# Patient Record
Sex: Female | Born: 1980
Health system: Southern US, Community
[De-identification: ages and names within clinical notes are randomized; demographics above are authoritative.]

## PROBLEM LIST (undated history)

## (undated) DIAGNOSIS — G43909 Migraine, unspecified, not intractable, without status migrainosus: Secondary | ICD-10-CM

## (undated) DIAGNOSIS — IMO0001 Reserved for inherently not codable concepts without codable children: Secondary | ICD-10-CM

## (undated) DIAGNOSIS — D649 Anemia, unspecified: Secondary | ICD-10-CM

## (undated) DIAGNOSIS — E039 Hypothyroidism, unspecified: Secondary | ICD-10-CM

## (undated) DIAGNOSIS — M1990 Unspecified osteoarthritis, unspecified site: Secondary | ICD-10-CM

## (undated) DIAGNOSIS — R51 Headache: Secondary | ICD-10-CM

## (undated) DIAGNOSIS — R519 Headache, unspecified: Secondary | ICD-10-CM

## (undated) HISTORY — DX: Hypothyroidism, unspecified: E03.9

## (undated) HISTORY — DX: Anemia, unspecified: D64.9

## (undated) HISTORY — PX: TUBAL LIGATION: SHX77

---

## 2004-10-14 ENCOUNTER — Ambulatory Visit (HOSPITAL_COMMUNITY): Admission: RE | Admit: 2004-10-14 | Discharge: 2004-10-14 | Payer: Self-pay | Admitting: Obstetrics

## 2005-01-21 ENCOUNTER — Ambulatory Visit (HOSPITAL_COMMUNITY): Admission: RE | Admit: 2005-01-21 | Discharge: 2005-01-21 | Payer: Self-pay | Admitting: Obstetrics

## 2005-01-26 ENCOUNTER — Inpatient Hospital Stay (HOSPITAL_COMMUNITY): Admission: AD | Admit: 2005-01-26 | Discharge: 2005-01-26 | Payer: Self-pay | Admitting: Obstetrics

## 2005-04-10 ENCOUNTER — Inpatient Hospital Stay (HOSPITAL_COMMUNITY): Admission: AD | Admit: 2005-04-10 | Discharge: 2005-04-10 | Payer: Self-pay | Admitting: Obstetrics

## 2005-04-15 ENCOUNTER — Inpatient Hospital Stay (HOSPITAL_COMMUNITY): Admission: AD | Admit: 2005-04-15 | Discharge: 2005-04-15 | Payer: Self-pay | Admitting: Obstetrics

## 2005-04-16 ENCOUNTER — Ambulatory Visit (HOSPITAL_COMMUNITY): Admission: RE | Admit: 2005-04-16 | Discharge: 2005-04-16 | Payer: Self-pay | Admitting: Obstetrics & Gynecology

## 2005-05-16 ENCOUNTER — Inpatient Hospital Stay (HOSPITAL_COMMUNITY): Admission: AD | Admit: 2005-05-16 | Discharge: 2005-05-16 | Payer: Self-pay | Admitting: Obstetrics

## 2005-07-01 ENCOUNTER — Ambulatory Visit (HOSPITAL_COMMUNITY): Admission: RE | Admit: 2005-07-01 | Discharge: 2005-07-01 | Payer: Self-pay | Admitting: Obstetrics & Gynecology

## 2005-07-27 ENCOUNTER — Inpatient Hospital Stay (HOSPITAL_COMMUNITY): Admission: AD | Admit: 2005-07-27 | Discharge: 2005-08-05 | Payer: Self-pay | Admitting: Obstetrics & Gynecology

## 2005-08-06 ENCOUNTER — Inpatient Hospital Stay (HOSPITAL_COMMUNITY): Admission: AD | Admit: 2005-08-06 | Discharge: 2005-08-06 | Payer: Self-pay | Admitting: Obstetrics

## 2005-08-09 ENCOUNTER — Inpatient Hospital Stay (HOSPITAL_COMMUNITY): Admission: AD | Admit: 2005-08-09 | Discharge: 2005-08-09 | Payer: Self-pay | Admitting: Obstetrics & Gynecology

## 2005-08-17 ENCOUNTER — Inpatient Hospital Stay (HOSPITAL_COMMUNITY): Admission: AD | Admit: 2005-08-17 | Discharge: 2005-08-17 | Payer: Self-pay | Admitting: Obstetrics & Gynecology

## 2005-08-19 ENCOUNTER — Inpatient Hospital Stay (HOSPITAL_COMMUNITY): Admission: AD | Admit: 2005-08-19 | Discharge: 2005-08-25 | Payer: Self-pay | Admitting: Obstetrics & Gynecology

## 2005-08-27 ENCOUNTER — Inpatient Hospital Stay (HOSPITAL_COMMUNITY): Admission: AD | Admit: 2005-08-27 | Discharge: 2005-09-06 | Payer: Self-pay | Admitting: Obstetrics

## 2005-09-03 ENCOUNTER — Encounter (INDEPENDENT_AMBULATORY_CARE_PROVIDER_SITE_OTHER): Payer: Self-pay | Admitting: Specialist

## 2006-02-18 ENCOUNTER — Emergency Department (HOSPITAL_COMMUNITY): Admission: EM | Admit: 2006-02-18 | Discharge: 2006-02-18 | Payer: Self-pay | Admitting: Emergency Medicine

## 2006-03-30 ENCOUNTER — Emergency Department (HOSPITAL_COMMUNITY): Admission: EM | Admit: 2006-03-30 | Discharge: 2006-03-30 | Payer: Self-pay | Admitting: Family Medicine

## 2006-08-04 ENCOUNTER — Inpatient Hospital Stay (HOSPITAL_COMMUNITY): Admission: AD | Admit: 2006-08-04 | Discharge: 2006-08-04 | Payer: Self-pay | Admitting: Obstetrics and Gynecology

## 2006-08-18 ENCOUNTER — Ambulatory Visit (HOSPITAL_COMMUNITY): Admission: RE | Admit: 2006-08-18 | Discharge: 2006-08-18 | Payer: Self-pay | Admitting: Obstetrics and Gynecology

## 2006-10-18 HISTORY — PX: BILATERAL SALPINGECTOMY: SHX5743

## 2006-10-31 ENCOUNTER — Ambulatory Visit: Payer: Self-pay | Admitting: Obstetrics & Gynecology

## 2006-11-01 ENCOUNTER — Ambulatory Visit (HOSPITAL_COMMUNITY): Admission: RE | Admit: 2006-11-01 | Discharge: 2006-11-01 | Payer: Self-pay | Admitting: Family Medicine

## 2006-11-02 ENCOUNTER — Ambulatory Visit: Payer: Self-pay | Admitting: Obstetrics & Gynecology

## 2006-11-02 ENCOUNTER — Inpatient Hospital Stay (HOSPITAL_COMMUNITY): Admission: AD | Admit: 2006-11-02 | Discharge: 2006-11-03 | Payer: Self-pay | Admitting: Obstetrics and Gynecology

## 2006-11-07 ENCOUNTER — Ambulatory Visit: Payer: Self-pay | Admitting: Obstetrics & Gynecology

## 2006-11-14 ENCOUNTER — Ambulatory Visit: Payer: Self-pay | Admitting: Obstetrics & Gynecology

## 2006-11-16 ENCOUNTER — Ambulatory Visit: Payer: Self-pay | Admitting: *Deleted

## 2006-11-16 ENCOUNTER — Inpatient Hospital Stay (HOSPITAL_COMMUNITY): Admission: AD | Admit: 2006-11-16 | Discharge: 2006-11-16 | Payer: Self-pay | Admitting: Gynecology

## 2006-11-21 ENCOUNTER — Ambulatory Visit: Payer: Self-pay | Admitting: Obstetrics & Gynecology

## 2006-11-22 ENCOUNTER — Ambulatory Visit: Payer: Self-pay | Admitting: Obstetrics & Gynecology

## 2006-11-28 ENCOUNTER — Ambulatory Visit: Payer: Self-pay | Admitting: Family Medicine

## 2006-11-29 ENCOUNTER — Ambulatory Visit: Payer: Self-pay | Admitting: *Deleted

## 2006-11-29 ENCOUNTER — Inpatient Hospital Stay (HOSPITAL_COMMUNITY): Admission: AD | Admit: 2006-11-29 | Discharge: 2006-11-29 | Payer: Self-pay | Admitting: Gynecology

## 2006-12-05 ENCOUNTER — Ambulatory Visit: Payer: Self-pay | Admitting: Obstetrics & Gynecology

## 2006-12-08 ENCOUNTER — Ambulatory Visit: Payer: Self-pay | Admitting: Family Medicine

## 2006-12-12 ENCOUNTER — Ambulatory Visit: Payer: Self-pay | Admitting: Family Medicine

## 2006-12-15 ENCOUNTER — Ambulatory Visit: Payer: Self-pay | Admitting: *Deleted

## 2006-12-15 ENCOUNTER — Inpatient Hospital Stay (HOSPITAL_COMMUNITY): Admission: AD | Admit: 2006-12-15 | Discharge: 2006-12-16 | Payer: Self-pay | Admitting: Family Medicine

## 2006-12-15 ENCOUNTER — Ambulatory Visit: Payer: Self-pay | Admitting: Family Medicine

## 2006-12-19 ENCOUNTER — Ambulatory Visit: Payer: Self-pay | Admitting: Family Medicine

## 2006-12-22 ENCOUNTER — Ambulatory Visit: Payer: Self-pay | Admitting: Obstetrics & Gynecology

## 2006-12-24 ENCOUNTER — Inpatient Hospital Stay (HOSPITAL_COMMUNITY): Admission: AD | Admit: 2006-12-24 | Discharge: 2006-12-24 | Payer: Self-pay | Admitting: Obstetrics and Gynecology

## 2006-12-24 ENCOUNTER — Ambulatory Visit: Payer: Self-pay | Admitting: Obstetrics and Gynecology

## 2006-12-26 ENCOUNTER — Ambulatory Visit: Payer: Self-pay | Admitting: Obstetrics & Gynecology

## 2006-12-27 ENCOUNTER — Ambulatory Visit: Payer: Self-pay | Admitting: Obstetrics and Gynecology

## 2006-12-29 ENCOUNTER — Inpatient Hospital Stay (HOSPITAL_COMMUNITY): Admission: AD | Admit: 2006-12-29 | Discharge: 2007-01-02 | Payer: Self-pay | Admitting: Gynecology

## 2006-12-29 ENCOUNTER — Ambulatory Visit: Payer: Self-pay | Admitting: *Deleted

## 2006-12-29 ENCOUNTER — Ambulatory Visit: Payer: Self-pay | Admitting: Gynecology

## 2007-01-06 ENCOUNTER — Ambulatory Visit: Payer: Self-pay | Admitting: Obstetrics & Gynecology

## 2007-01-06 ENCOUNTER — Inpatient Hospital Stay (HOSPITAL_COMMUNITY): Admission: RE | Admit: 2007-01-06 | Discharge: 2007-01-09 | Payer: Self-pay | Admitting: Obstetrics & Gynecology

## 2008-01-01 ENCOUNTER — Emergency Department (HOSPITAL_COMMUNITY): Admission: EM | Admit: 2008-01-01 | Discharge: 2008-01-01 | Payer: Self-pay | Admitting: Family Medicine

## 2008-05-31 ENCOUNTER — Emergency Department (HOSPITAL_COMMUNITY): Admission: EM | Admit: 2008-05-31 | Discharge: 2008-05-31 | Payer: Self-pay | Admitting: Emergency Medicine

## 2008-06-04 ENCOUNTER — Ambulatory Visit (HOSPITAL_COMMUNITY): Admission: RE | Admit: 2008-06-04 | Discharge: 2008-06-04 | Payer: Self-pay | Admitting: Orthopaedic Surgery

## 2008-06-05 ENCOUNTER — Ambulatory Visit: Payer: Self-pay | Admitting: Oncology

## 2008-06-13 LAB — CBC WITH DIFFERENTIAL/PLATELET
Basophils Absolute: 0 10*3/uL (ref 0.0–0.1)
Eosinophils Absolute: 0 10*3/uL (ref 0.0–0.5)
HCT: 22.5 % — ABNORMAL LOW (ref 34.8–46.6)
HGB: 7.2 g/dL — ABNORMAL LOW (ref 11.6–15.9)
LYMPH%: 49.2 % — ABNORMAL HIGH (ref 14.0–48.0)
MCV: 85.1 fL (ref 81.0–101.0)
MONO#: 0.4 10*3/uL (ref 0.1–0.9)
NEUT#: 1.8 10*3/uL (ref 1.5–6.5)
NEUT%: 41.5 % (ref 39.6–76.8)
Platelets: 388 10*3/uL (ref 145–400)
RBC: 2.65 10*6/uL — ABNORMAL LOW (ref 3.70–5.32)
WBC: 4.4 10*3/uL (ref 3.9–10.0)

## 2008-06-13 LAB — CHCC SMEAR

## 2008-06-17 LAB — HEMOGLOBINOPATHY EVALUATION
Hemoglobin Other: 0 % (ref 0.0–0.0)
Hgb A2 Quant: 1.9 % — ABNORMAL LOW (ref 2.2–3.2)
Hgb A: 98.1 % — ABNORMAL HIGH (ref 96.8–97.8)
Hgb F Quant: 0 % (ref 0.0–2.0)
Hgb S Quant: 0 % (ref 0.0–0.0)

## 2008-06-17 LAB — COMPREHENSIVE METABOLIC PANEL
ALT: 10 U/L (ref 0–35)
CO2: 26 mEq/L (ref 19–32)
Calcium: 9.3 mg/dL (ref 8.4–10.5)
Chloride: 101 mEq/L (ref 96–112)
Creatinine, Ser: 1.2 mg/dL (ref 0.40–1.20)
Glucose, Bld: 73 mg/dL (ref 70–99)

## 2008-06-17 LAB — IRON AND TIBC: UIBC: 439 ug/dL

## 2008-06-17 LAB — FERRITIN: Ferritin: 3 ng/mL — ABNORMAL LOW (ref 10–291)

## 2008-06-18 ENCOUNTER — Ambulatory Visit (HOSPITAL_COMMUNITY): Admission: RE | Admit: 2008-06-18 | Discharge: 2008-06-19 | Payer: Self-pay | Admitting: Orthopaedic Surgery

## 2008-07-30 ENCOUNTER — Ambulatory Visit: Payer: Self-pay | Admitting: Oncology

## 2008-08-01 LAB — CBC WITH DIFFERENTIAL/PLATELET
Basophils Absolute: 0 10*3/uL (ref 0.0–0.1)
EOS%: 0.2 % (ref 0.0–7.0)
Eosinophils Absolute: 0 10*3/uL (ref 0.0–0.5)
HGB: 9.8 g/dL — ABNORMAL LOW (ref 11.6–15.9)
MONO#: 0.4 10*3/uL (ref 0.1–0.9)
NEUT#: 1.5 10*3/uL (ref 1.5–6.5)
RDW: 20.1 % — ABNORMAL HIGH (ref 11.3–14.5)
WBC: 3.4 10*3/uL — ABNORMAL LOW (ref 3.9–10.0)
lymph#: 1.5 10*3/uL (ref 0.9–3.3)

## 2008-08-01 LAB — IRON AND TIBC
TIBC: 316 ug/dL (ref 250–470)
UIBC: 258 ug/dL

## 2008-10-06 ENCOUNTER — Emergency Department (HOSPITAL_COMMUNITY): Admission: EM | Admit: 2008-10-06 | Discharge: 2008-10-06 | Payer: Self-pay | Admitting: Family Medicine

## 2008-10-30 ENCOUNTER — Ambulatory Visit: Payer: Self-pay | Admitting: Oncology

## 2008-11-01 LAB — CBC WITH DIFFERENTIAL/PLATELET
Eosinophils Absolute: 0 10*3/uL (ref 0.0–0.5)
HCT: 28.1 % — ABNORMAL LOW (ref 34.8–46.6)
LYMPH%: 45.2 % (ref 14.0–48.0)
MCV: 99.8 fL (ref 81.0–101.0)
MONO#: 0.4 10*3/uL (ref 0.1–0.9)
NEUT#: 1.4 10*3/uL — ABNORMAL LOW (ref 1.5–6.5)
NEUT%: 42.2 % (ref 39.6–76.8)
Platelets: 228 10*3/uL (ref 145–400)
WBC: 3.2 10*3/uL — ABNORMAL LOW (ref 3.9–10.0)

## 2008-11-01 LAB — IRON AND TIBC
%SAT: 19 % — ABNORMAL LOW (ref 20–55)
TIBC: 291 ug/dL (ref 250–470)

## 2008-11-03 ENCOUNTER — Emergency Department (HOSPITAL_COMMUNITY): Admission: EM | Admit: 2008-11-03 | Discharge: 2008-11-03 | Payer: Self-pay | Admitting: Family Medicine

## 2009-02-25 ENCOUNTER — Ambulatory Visit: Payer: Self-pay | Admitting: Oncology

## 2009-02-27 LAB — COMPREHENSIVE METABOLIC PANEL
AST: 37 U/L (ref 0–37)
Albumin: 4.3 g/dL (ref 3.5–5.2)
Alkaline Phosphatase: 33 U/L — ABNORMAL LOW (ref 39–117)
Potassium: 4 mEq/L (ref 3.5–5.3)
Sodium: 137 mEq/L (ref 135–145)
Total Protein: 7.6 g/dL (ref 6.0–8.3)

## 2009-02-27 LAB — CBC WITH DIFFERENTIAL/PLATELET
BASO%: 0.7 % (ref 0.0–2.0)
EOS%: 0.1 % (ref 0.0–7.0)
MCH: 34.5 pg — ABNORMAL HIGH (ref 25.1–34.0)
MCV: 99.6 fL (ref 79.5–101.0)
MONO%: 11.1 % (ref 0.0–14.0)
NEUT#: 1.6 10*3/uL (ref 1.5–6.5)
RBC: 2.59 10*6/uL — ABNORMAL LOW (ref 3.70–5.45)
RDW: 13 % (ref 11.2–14.5)

## 2009-05-26 ENCOUNTER — Ambulatory Visit: Payer: Self-pay | Admitting: Oncology

## 2009-11-19 ENCOUNTER — Emergency Department (HOSPITAL_COMMUNITY): Admission: EM | Admit: 2009-11-19 | Discharge: 2009-11-19 | Payer: Self-pay | Admitting: Emergency Medicine

## 2009-12-31 ENCOUNTER — Inpatient Hospital Stay (HOSPITAL_COMMUNITY): Admission: AD | Admit: 2009-12-31 | Discharge: 2009-12-31 | Payer: Self-pay | Admitting: Obstetrics and Gynecology

## 2010-01-01 ENCOUNTER — Emergency Department (HOSPITAL_COMMUNITY): Admission: EM | Admit: 2010-01-01 | Discharge: 2010-01-01 | Payer: Self-pay | Admitting: Emergency Medicine

## 2010-01-06 ENCOUNTER — Ambulatory Visit: Payer: Self-pay | Admitting: Oncology

## 2010-01-06 LAB — CBC WITH DIFFERENTIAL/PLATELET
BASO%: 0.5 % (ref 0.0–2.0)
EOS%: 0.3 % (ref 0.0–7.0)
HCT: 24.2 % — ABNORMAL LOW (ref 34.8–46.6)
LYMPH%: 43.2 % (ref 14.0–49.7)
MCH: 35.2 pg — ABNORMAL HIGH (ref 25.1–34.0)
MCHC: 33.6 g/dL (ref 31.5–36.0)
MONO#: 0.4 10*3/uL (ref 0.1–0.9)
NEUT%: 47.8 % (ref 38.4–76.8)
RBC: 2.31 10*6/uL — ABNORMAL LOW (ref 3.70–5.45)
WBC: 4.4 10*3/uL (ref 3.9–10.3)
lymph#: 1.9 10*3/uL (ref 0.9–3.3)

## 2010-01-07 LAB — COMPREHENSIVE METABOLIC PANEL
ALT: 21 U/L (ref 0–35)
AST: 27 U/L (ref 0–37)
Chloride: 102 mEq/L (ref 96–112)
Creatinine, Ser: 1.07 mg/dL (ref 0.40–1.20)
Sodium: 137 mEq/L (ref 135–145)
Total Bilirubin: 0.4 mg/dL (ref 0.3–1.2)
Total Protein: 7 g/dL (ref 6.0–8.3)

## 2010-01-07 LAB — FERRITIN: Ferritin: 18 ng/mL (ref 10–291)

## 2010-01-07 LAB — IRON AND TIBC: %SAT: 12 % — ABNORMAL LOW (ref 20–55)

## 2010-02-20 ENCOUNTER — Encounter: Payer: Self-pay | Admitting: Family

## 2010-02-20 ENCOUNTER — Ambulatory Visit: Payer: Self-pay | Admitting: Obstetrics & Gynecology

## 2010-02-20 LAB — CONVERTED CEMR LAB
HCT: 34.7 % — ABNORMAL LOW (ref 36.0–46.0)
MCHC: 32 g/dL (ref 30.0–36.0)
MCV: 105.2 fL — ABNORMAL HIGH (ref 78.0–100.0)
Platelets: 254 10*3/uL (ref 150–400)
TSH: 77.614 microintl units/mL — ABNORMAL HIGH (ref 0.350–4.500)

## 2010-03-12 ENCOUNTER — Ambulatory Visit: Payer: Self-pay | Admitting: Oncology

## 2010-03-13 LAB — CBC WITH DIFFERENTIAL/PLATELET
Basophils Absolute: 0 10*3/uL (ref 0.0–0.1)
EOS%: 0.2 % (ref 0.0–7.0)
Eosinophils Absolute: 0 10*3/uL (ref 0.0–0.5)
HGB: 10.6 g/dL — ABNORMAL LOW (ref 11.6–15.9)
LYMPH%: 44.6 % (ref 14.0–49.7)
MCH: 35.5 pg — ABNORMAL HIGH (ref 25.1–34.0)
MCV: 104.9 fL — ABNORMAL HIGH (ref 79.5–101.0)
MONO%: 14.4 % — ABNORMAL HIGH (ref 0.0–14.0)
NEUT#: 1.5 10*3/uL (ref 1.5–6.5)
NEUT%: 40.2 % (ref 38.4–76.8)
Platelets: 237 10*3/uL (ref 145–400)
RDW: 13.4 % (ref 11.2–14.5)

## 2010-03-17 LAB — COMPREHENSIVE METABOLIC PANEL
AST: 23 U/L (ref 0–37)
Albumin: 4.1 g/dL (ref 3.5–5.2)
Alkaline Phosphatase: 40 U/L (ref 39–117)
BUN: 16 mg/dL (ref 6–23)
Creatinine, Ser: 0.95 mg/dL (ref 0.40–1.20)
Glucose, Bld: 60 mg/dL — ABNORMAL LOW (ref 70–99)
Potassium: 3.8 mEq/L (ref 3.5–5.3)
Total Bilirubin: 0.4 mg/dL (ref 0.3–1.2)

## 2010-03-17 LAB — ERYTHROPOIETIN: Erythropoietin: 11.4 m[IU]/mL (ref 2.6–34.0)

## 2010-03-17 LAB — IRON AND TIBC
Iron: 82 ug/dL (ref 42–145)
TIBC: 257 ug/dL (ref 250–470)
UIBC: 175 ug/dL

## 2010-04-06 DIAGNOSIS — E039 Hypothyroidism, unspecified: Secondary | ICD-10-CM | POA: Insufficient documentation

## 2010-04-06 DIAGNOSIS — D509 Iron deficiency anemia, unspecified: Secondary | ICD-10-CM | POA: Insufficient documentation

## 2010-04-06 DIAGNOSIS — N920 Excessive and frequent menstruation with regular cycle: Secondary | ICD-10-CM | POA: Insufficient documentation

## 2010-04-07 ENCOUNTER — Ambulatory Visit: Payer: Self-pay | Admitting: Family Medicine

## 2010-04-07 ENCOUNTER — Encounter: Payer: Self-pay | Admitting: Sports Medicine

## 2010-04-07 LAB — CONVERTED CEMR LAB
ALT: 9 units/L (ref 0–35)
AST: 15 U/L (ref 0–37)
Albumin: 4 g/dL (ref 3.5–5.2)
Alkaline Phosphatase: 42 units/L (ref 39–117)
BUN: 12 mg/dL (ref 6–23)
CO2: 24 meq/L (ref 19–32)
Calcium: 9.1 mg/dL (ref 8.4–10.5)
Chloride: 105 meq/L (ref 96–112)
Cholesterol: 136 mg/dL (ref 0–200)
Creatinine, Ser: 0.85 mg/dL (ref 0.40–1.20)
Free T4: 1.32 ng/dL (ref 0.80–1.80)
Glucose, Bld: 80 mg/dL (ref 70–99)
HCT: 32.8 % — ABNORMAL LOW (ref 36.0–46.0)
HDL: 58 mg/dL (ref >39)
Hemoglobin: 10.4 g/dL — ABNORMAL LOW (ref 12.0–15.0)
LDL Cholesterol: 71 mg/dL (ref 0–99)
MCHC: 31.7 g/dL (ref 30.0–36.0)
MCV: 105.5 fL — ABNORMAL HIGH (ref 78.0–100.0)
Platelets: 251 10*3/uL (ref 150–400)
Potassium: 4 meq/L (ref 3.5–5.3)
RBC: 3.11 M/uL — ABNORMAL LOW (ref 3.87–5.11)
RDW: 12.1 % (ref 11.5–15.5)
Sodium: 140 meq/L (ref 135–145)
T3, Free: 2.8 pg/mL (ref 2.3–4.2)
TSH: 4.355 u[IU]/mL (ref 0.350–4.500)
Total Bilirubin: 0.5 mg/dL (ref 0.3–1.2)
Total CHOL/HDL Ratio: 2.3
Total Protein: 6.9 g/dL (ref 6.0–8.3)
Triglycerides: 35 mg/dL (ref <150)
VLDL: 7 mg/dL (ref 0–40)
WBC: 3.1 10*3/microliter — ABNORMAL LOW (ref 4.0–10.5)

## 2010-04-10 ENCOUNTER — Encounter: Payer: Self-pay | Admitting: Sports Medicine

## 2010-04-14 ENCOUNTER — Encounter: Payer: Self-pay | Admitting: Sports Medicine

## 2010-04-28 ENCOUNTER — Ambulatory Visit: Payer: Self-pay | Admitting: Family Medicine

## 2010-04-28 ENCOUNTER — Encounter: Payer: Self-pay | Admitting: Sports Medicine

## 2010-04-28 LAB — CONVERTED CEMR LAB: Retic Ct Pct: 1.9 % (ref 0.4–3.1)

## 2010-06-05 ENCOUNTER — Ambulatory Visit: Payer: Self-pay | Admitting: Oncology

## 2010-06-17 ENCOUNTER — Emergency Department (HOSPITAL_COMMUNITY): Admission: EM | Admit: 2010-06-17 | Discharge: 2010-06-17 | Payer: Self-pay | Admitting: Emergency Medicine

## 2010-08-09 ENCOUNTER — Emergency Department (HOSPITAL_COMMUNITY): Admission: EM | Admit: 2010-08-09 | Discharge: 2010-08-09 | Payer: Self-pay | Admitting: Family Medicine

## 2010-11-07 ENCOUNTER — Encounter: Payer: Self-pay | Admitting: Obstetrics

## 2010-11-08 ENCOUNTER — Encounter: Payer: Self-pay | Admitting: *Deleted

## 2010-11-17 NOTE — Miscellaneous (Signed)
Summary: Summary of records from Endocrinologist  Clinical Lists Changes  Observations: Added new observation of ENDOCRINEMD: Polly Cobia in [place], [place] (04/14/2010 11:12) Added new observation of PAST MED HX: Hx Graves disease, subsequent hypothyroidism s/p radioactive iodine at [facility] Jan 2011. Menorrhagia Iron Deficiency Anemia [id number] s/p BTL (04/14/2010 11:12)      Past History:  Past Medical History: Hx Graves disease, subsequent hypothyroidism s/p radioactive iodine at [facility] Jan 2011. Menorrhagia Iron Deficiency Anemia [id number] s/p BTL   Habits & Providers     Endocrinologist: Polly Cobia in [place], [place]

## 2010-11-17 NOTE — Miscellaneous (Signed)
Summary: ROI  ROI   Imported By: Knox Royalty 07/09/2010 10:00:04  _____________________________________________________________________  External Attachment:    Type:   Image     Comment:   External Document

## 2010-11-17 NOTE — Assessment & Plan Note (Signed)
Summary: Fasting Lipid, Cmet, CBC, TSH, FT4 & FT3 drawn

## 2010-11-17 NOTE — Letter (Signed)
Summary: Physical Exam Letter  [facility]  [address]   [place], [place] [address]   Phone: [phone]  Fax: [phone]    04/28/2010  Ercilia Howerter [address] [place], [place]  [address]  To whom it may concern,  Ms. Darrow was seen by me and has a normal examination as documented below:  Physical Exam Vitals: Blood pressure: 116/82 Pulse: 62 respirations:18 Temp: 98.26F Weight 152.1lbs General:  Well-developed,well-nourished,in no acute distress; alert,appropriate and cooperative throughout examination Head:  Normocephalic and atraumatic without obvious abnormalities. No apparent alopecia or balding. Eyes:  No corneal or conjunctival inflammation noted. EOMI. Perrla. Ears:  External ear exam shows no significant lesions or deformities.   Nose:  External nasal examination shows no deformity or inflammation. Nasal mucosa are pink and moist without lesions or exudates. Mouth:  Oral mucosa and oropharynx without lesions or exudates.  Teeth in good repair. Neck:  No deformities, masses, or tenderness noted. Chest Wall:  No deformities, masses, or tenderness noted. Lungs:  Normal respiratory effort, chest expands symmetrically. Lungs are clear to auscultation, no crackles or wheezes. Heart:  Normal rate and regular rhythm. S1 and S2 normal without gallop, murmur, click, rub or other extra sounds. Abdomen:  Bowel sounds positive,abdomen soft and non-tender without masses, organomegaly or hernias noted. Msk:  No deformity or scoliosis noted of thoracic or lumbar spine.   Pulses:  R and L carotid,radial,femoral,dorsalis pedis and posterior tibial pulses are full and equal bilaterally Extremities:  No clubbing, cyanosis, edema, or deformity noted with normal full range of motion of all joints.   Neurologic:  No cranial nerve deficits noted. Station and gait are normal. Plantar reflexes are down-going bilaterally. DTRs are symmetrical throughout. Sensory, motor and  coordinative functions appear intact. Skin:  Intact without suspicious lesions or rashes Psych:  Cognition and judgment appear intact. Alert and cooperative with normal attention span and concentration. No apparent delusions, illusions, hallucinations   Sincerely,   Rodney Langton MD

## 2010-11-17 NOTE — Assessment & Plan Note (Signed)
Summary: cpe,df   Vital Signs:  Patient profile:   30 year old female Menstrual status:  regular LMP:     04/22/2010 Weight:      152.1 pounds Temp:     98.9 degrees F oral Pulse rate:   62 / minute Pulse rhythm:   regular BP sitting:   116 / 82  (right arm) Cuff size:   regular  Vitals Entered By: Loralee Pacas CMA (April 28, 2010 8:40 AM) CC: cpe Comments pt states that she has been clotting more while on her menstrual cycle LMP (date): 04/22/2010     Menstrual Status regular Enter LMP: 04/22/2010   Primary Care Provider:  Rodney Langton, MD  CC:  cpe.  History of Present Illness: 74F with hypothyroidism here for fu of labs.  Missed last appt.  Hypothyroid:  TSH WNL  Anemia:  Dx of iron deficiency but macrocytic on CBC.  Currently getting iron supplementation.  Toe pain:  Had something dropped on left great toe yesterday minimal swelling and some pain.  Was able to walk without a limp, no breaks in skin.  Habits & Providers  Alcohol-Tobacco-Diet     Tobacco Status: never  Current Medications (verified): 1)  Integra 62.5-62.5-40-3 Mg Caps (Fe Fum-Fepoly-Vit C-Vit B3) .... One Cap By Mouth Daily. 2)  Levothyroxine Sodium 200 Mcg Tabs (Levothyroxine Sodium) .... One Tab By Mouth Daily 3)  Ibuprofen 600 Mg Tabs (Ibuprofen) .... One Tab By Mouth Q6h As Needed For Pain.  Allergies (verified): No Known Drug Allergies  Past History:  Past Medical History: Last updated: 04/14/2010 Hx Graves disease, subsequent hypothyroidism s/p radioactive iodine at [facility] Jan 2011. Menorrhagia Iron Deficiency Anemia [id number] s/p BTL  Review of Systems       See HPI  Physical Exam  General:  Well-developed,well-nourished,in no acute distress; alert,appropriate and cooperative throughout examination Msk:  Ankle L: No visible erythema or swelling. Range of motion is full in all directions. Strength is 5/5 in all directions. Stable lateral and medial ligaments;  squeeze test and kleiger test unremarkable; Talar dome nontender; No pain at base of 5th MT; No tenderness over cuboid; No tenderness over N spot or navicular prominence No tenderness on posterior aspects of lateral and medial malleolus No sign of peroneal tendon subluxations; Negative tarsal tunnel tinel's Able to walk 4 steps. Mild TTP over IP joint of 1st toe.  No swelling.  No deformity. Additional Exam:  Toe extension splint placed.  Fits well, NVI distally.   Impression & Recommendations:  Problem # 1:  ANEMIA, IRON DEFICIENCY (ICD-280.9) Assessment Unchanged Macrocytosis ddx includes normal reticulocytosis vs B12/folate deficiency.  Will check below labs and tx accordingly.  Her updated medication list for this problem includes:    Integra 62.5-62.5-40-3 Mg Caps (Fe fum-fepoly-vit c-vit b3) ..... One cap by mouth daily.  Orders: B12-[facility] ([id number]) Folate-[facility] ([id number]) Retic-[facility] ([id number]) [facility]- Est  Level 4 (10932)  Problem # 2:  HYPOTHYROIDISM, POST-RADIATION (ICD-244.1) Assessment: Improved TSH WNL, recheck in 6 weeks.  Her updated medication list for this problem includes:    Levothyroxine Sodium 200 Mcg Tabs (Levothyroxine sodium) ..... One tab by mouth daily  Orders: [facility]- Est  Level 4 (35573)  Problem # 3:  TOE PAIN (ICD-729.5) Assessment: New Likely just bruised, pt to keep toe splint on a week then RTC for re-eval.  If no better will Xray great toe.  In slippers today, advised harder soled tennis shoes.  Orders: [facility]- [employer] [phone])  Complete Medication List:  1)  Integra 62.5-62.5-40-3 Mg Caps (Fe fum-fepoly-vit c-vit b3) .... One cap by mouth daily. 2)  Levothyroxine Sodium 200 Mcg Tabs (Levothyroxine sodium) .... One tab by mouth daily 3)  Ibuprofen 600 Mg Tabs (Ibuprofen) .... One tab by mouth q6h as needed for pain.

## 2010-11-17 NOTE — Assessment & Plan Note (Signed)
Summary: NP/Hypothyroidism/KF   Vital Signs:  Patient profile:   30 year old female Height:      61.75 inches Weight:      151.4 pounds BMI:     28.02 Temp:     98.1 degrees F oral Pulse rate:   65 / minute BP sitting:   109 / 76  (left arm) Cuff size:   regular  Vitals Entered By: Gladstone Pih (April 06, 2010 12:08 PM) CC: NP---Thyriod Is Patient Diabetic? No Pain Assessment Patient in pain? no        Primary Care Provider:  Rodney Langton, MD  CC:  NP---Thyriod.  History of Present Illness: New pt eval.  Pt arrived late for initial visit and thus an abbreviated visit was conducted.  Thyroid:  Noted on routine labwork at [facility] to have TSH of 77, she has a hx of hypothyroidism but stopped taking her meds for some reason.  Menorrhagia:  Severe, causing anemia.  Followed at [facility].    Habits & Providers  Alcohol-Tobacco-Diet     Tobacco Status: never     Gynecologist: [facility]  Current Medications (verified): 1)  Integra 62.5-62.5-40-3 Mg Caps (Fe Fum-Fepoly-Vit C-Vit B3) .... One Cap By Mouth Daily. 2)  Levothyroxine Sodium 200 Mcg Tabs (Levothyroxine Sodium) .... One Tab By Mouth Daily 3)  Ibuprofen 600 Mg Tabs (Ibuprofen) .... One Tab By Mouth Q6h As Needed For Pain.  Allergies (verified): No Known Drug Allergies  Past History:  Past Medical History: Hypothyroidism s/p neck irradiation at [facility] @ age 52, requesting records. Menorrhagia Iron Deficiency Anemia [id number] s/p BTL  Past Surgical History: OFIR R fibular fx 2009. (Dr. Magnus Ivan) C/S x3  Family History: Hypothyroid, breast Ca, Heart disease, HTN, DM2  Social History: Denies alcohol/tobacco/drug use.  Smoking Status:  never  Review of Systems       See HPI  Physical Exam  General:  Well-developed,well-nourished,in no acute distress; alert,appropriate and cooperative throughout examination Head:  Normocephalic and atraumatic without obvious abnormalities. No apparent alopecia or  balding. Eyes:  No corneal or conjunctival inflammation noted. EOMI. Perrla. Ears:  External ear exam shows no significant lesions or deformities.   Nose:  External nasal examination shows no deformity or inflammation. Nasal mucosa are pink and moist without lesions or exudates. Mouth:  Oral mucosa and oropharynx without lesions or exudates.  Teeth in good repair. Neck:  No deformities, masses, or tenderness noted. Chest Wall:  No deformities, masses, or tenderness noted. Lungs:  Normal respiratory effort, chest expands symmetrically. Lungs are clear to auscultation, no crackles or wheezes. Heart:  Normal rate and regular rhythm. S1 and S2 normal without gallop, murmur, click, rub or other extra sounds. Abdomen:  Bowel sounds positive,abdomen soft and non-tender without masses, organomegaly or hernias noted. Msk:  No deformity or scoliosis noted of thoracic or lumbar spine.   Pulses:  R and L carotid,radial,femoral,dorsalis pedis and posterior tibial pulses are full and equal bilaterally Extremities:  No clubbing, cyanosis, edema, or deformity noted with normal full range of motion of all joints.   Neurologic:  No cranial nerve deficits noted. Station and gait are normal. Plantar reflexes are down-going bilaterally. DTRs are symmetrical throughout. Sensory, motor and coordinative functions appear intact. Skin:  Intact without suspicious lesions or rashes Psych:  Cognition and judgment appear intact. Alert and cooperative with normal attention span and concentration. No apparent delusions, illusions, hallucinations   Impression & Recommendations:  Problem # 1:  HYPOTHYROIDISM, POST-RADIATION (ICD-244.1) Assessment New Checking for  other endocrine disorders, started on levothyroxine at [facility] approx 6 wks ago, checking another TSH today.   She will come back for FLP and the below labs.  Will request records from [facility] after pt signs ROI.  Her updated medication list for this problem  includes:    Levothyroxine Sodium 200 Mcg Tabs (Levothyroxine sodium) ..... One tab by mouth daily  Orders: A1C-[facility] (83036)Future Orders: Comp Met-[facility] ([id number]) ... 04/07/2011 Lipid-[facility] ([id number]) ... 04/07/2011 CBC-[facility] ([id number]) ... 04/07/2011 A1C-[facility] (13086) ... 04/07/2011 Free T3-[facility] [phone]) ... 04/07/2011 Free T4-[facility] [phone]) ... 04/07/2011 TSH-[facility] [phone]) ... 04/07/2011  Problem # 2:  ANEMIA, IRON DEFICIENCY (ICD-280.9) Assessment: New PO Iron supplementation.  She also gets IV iron q6 months.  Her updated medication list for this problem includes:    Integra 62.5-62.5-40-3 Mg Caps (Fe fum-fepoly-vit c-vit b3) ..... One cap by mouth daily.  Problem # 3:  MENORRHAGIA (ICD-626.2) Assessment: New Would correct hypothyroidism before pursuing other treatments for this.  Followed at [facility].  Complete Medication List: 1)  Integra 62.5-62.5-40-3 Mg Caps (Fe fum-fepoly-vit c-vit b3) .... One cap by mouth daily. 2)  Levothyroxine Sodium 200 Mcg Tabs (Levothyroxine sodium) .... One tab by mouth daily 3)  Ibuprofen 600 Mg Tabs (Ibuprofen) .... One tab by mouth q6h as needed for pain.  Patient Instructions: 1)  Great to meet you today, 2)  Go to the front to make a lab appt.  Come back fasting (no food after midnight) to have your bloodwork checked.  You do not have to see me on that day. 3)  I will call you if any changes need to be made in your meds.   4)  Come back to see me in 2 weeks to discuss results. 5)  Please sign a release of information so that we can obtain your medical records from [facility]. 6)  -Dr. Karie Schwalbe.

## 2010-12-30 LAB — IRON AND TIBC
Iron: 79 ug/dL (ref 42–135)
Saturation Ratios: 25 % (ref 20–55)
TIBC: 319 ug/dL (ref 250–470)
UIBC: 240 ug/dL

## 2010-12-30 LAB — POCT URINALYSIS DIPSTICK
Glucose, UA: 100 mg/dL — AB
Ketones, ur: 40 mg/dL — AB
Nitrite: POSITIVE — AB
Protein, ur: >=300 mg/dL — AB
Specific Gravity, Urine: 1.01 (ref 1.005–1.030)
Urobilinogen, UA: >=8 mg/dL (ref 0.0–1.0)
pH: 8.5 — ABNORMAL HIGH (ref 5.0–8.0)

## 2010-12-30 LAB — POCT I-STAT, CHEM 8
Calcium, Ion: 1.13 mmol/L (ref 1.12–1.32)
Creatinine, Ser: 0.9 mg/dL (ref 0.4–1.2)
Glucose, Bld: 74 mg/dL (ref 70–99)
Hemoglobin: 12.2 g/dL (ref 12.0–15.0)
Potassium: 4 mEq/L (ref 3.5–5.1)
TCO2: 25 mmol/L (ref 0–100)

## 2010-12-30 LAB — CBC
Hemoglobin: 11.2 g/dL — ABNORMAL LOW (ref 12.0–15.0)
Platelets: 270 10*3/uL (ref 150–400)
RBC: 3.59 MIL/uL — ABNORMAL LOW (ref 3.87–5.11)
WBC: 3.5 10*3/uL — ABNORMAL LOW (ref 4.0–10.5)

## 2010-12-30 LAB — TSH: TSH: 4.674 u[IU]/mL — ABNORMAL HIGH (ref 0.350–4.500)

## 2011-01-10 LAB — CBC
HCT: 24.5 % — ABNORMAL LOW (ref 36.0–46.0)
MCV: 104.4 fL — ABNORMAL HIGH (ref 78.0–100.0)
Platelets: 232 10*3/uL (ref 150–400)
RDW: 13.1 % (ref 11.5–15.5)

## 2011-01-10 LAB — WET PREP, GENITAL
Clue Cells Wet Prep HPF POC: NONE SEEN
Trich, Wet Prep: NONE SEEN

## 2011-01-10 LAB — URINALYSIS, ROUTINE W REFLEX MICROSCOPIC
Bilirubin Urine: NEGATIVE
Glucose, UA: 100 mg/dL — AB
Specific Gravity, Urine: 1.02 (ref 1.005–1.030)
pH: 7 (ref 5.0–8.0)

## 2011-01-10 LAB — GC/CHLAMYDIA PROBE AMP, GENITAL: GC Probe Amp, Genital: NEGATIVE

## 2011-01-10 LAB — URINE MICROSCOPIC-ADD ON

## 2011-01-10 LAB — SAMPLE TO BLOOD BANK

## 2011-03-02 NOTE — Op Note (Signed)
NAMECARLOS, QUACKENBUSH             ACCOUNT NO.:  [id number]   MEDICAL RECORD NO.:  [id number]          PATIENT TYPE:  OIB   LOCATION:  5031                         FACILITY:  [facility]   PHYSICIAN:  Vanita Panda. Magnus Ivan, M.D.DATE OF BIRTH:  [birth year]   DATE OF PROCEDURE:  06/18/2008  DATE OF DISCHARGE:                               OPERATIVE REPORT   PREOPERATIVE DIAGNOSIS:  Right ankle unstable fibula/lateral malleolus  fracture.   POSTOPERATIVE DIAGNOSIS:  Right ankle unstable fibula/lateral malleolus  fracture.   PROCEDURE:  Open reduction and internal fixation of right ankle fibular  fracture.   SURGEON:  Vanita Panda. Magnus Ivan, MD   ASSISTANT:  Wende Neighbors, PA   ANESTHESIA:  General.   BLOOD LOSS:  Minimal.   TOURNIQUET TIME:  59 minutes.   COMPLICATIONS:  None.   INDICATIONS:  Ms. Miranda Fowler is a 30 year old who 2 weeks ago injured her  right ankle on roller skating.  She was found to have a displaced  fibular fracture.  It was recommend that she undergo open reduction and  internal fixation of this injury.  Risks and benefits of this were  explained to her at length, and she agreed to proceed with surgery.  Her  hemoglobin run chronically low, and I had to get a Hematology consult to  see her for this and they found that she had chronic anemia and are  treating this with iron.  They feel it was safe for me to proceed with  the surgery.   DESCRIPTION OF PROCEDURE:  After informed consent was obtained, the  appropriate right ankle was marked.  She was brought to the operating  room and placed supine on the operating table.  General anesthesia was  then obtained.  Her right leg was prepped and draped with DuraPrep and a  sterile drapes and a stockinette was utilized.  Time-out was called and  she was identified as the correct patient and correct right ankle.  I  then wrapped out the ankle with an Esmarch and tourniquet was inflated  to 350 mm of pressure.  I then  made an incision directly over the fibula  on the lateral aspect of the ankle and carried this down to the fracture  site.  I did clean the fracture debris and found that was a simple  oblique fracture.  Using reduction forceps, I therefore reduced the  fracture in an anatomic position under direct fluoroscopic guidance.  I  then placed a single lag screw from anterior to posterior to lag the  fracture and buttressed this with a one-third semitubular plate on the  lateral aspect of the fibula with 2 proximal holes and 2 distal holes.  I put the ankle through range of motion and stresses under fluoroscopy  and the fracture was stable.  I then irrigated the tissue and closed the  deep tissue with interrupted 0 Vicryl suture over the plate followed by  2-0 Vicryl and subcutaneous tissue and interrupted 2-0 nylon on the  skin.  I infiltrated the incision with 0.25%  plain Marcaine.  I then placed Adaptic followed by well-padded sterile  dressing.  Postoperatively, I put her on Cam walker.  She was awakened,  extubated, and taken to the recovery room in stable condition.  All  final counts were correct and there were no complications noted.      Vanita Panda. Magnus Ivan, M.D.  Electronically Signed     CYB/MEDQ  D:  06/18/2008  T:  06/19/2008  Job:  [id number]

## 2011-03-05 NOTE — H&P (Signed)
Miranda Fowler, Miranda Fowler             ACCOUNT NO.:  [id number]   MEDICAL RECORD NO.:  [id number]          PATIENT TYPE:  INP   LOCATION:  9158                          [facility]   PHYSICIAN:  Roseanna Rainbow, M.D.DATE OF BIRTH:  03-09-1981   DATE OF ADMISSION:  08/19/2005  DATE OF DISCHARGE:                                HISTORY & PHYSICAL   CHIEF COMPLAINT:  The patient is a 30 year old para 2, with an estimated  date of confinement of September 15, 2005 with an intrauterine pregnancy at  35 plus weeks, with pregnancy-induced hypertension, complaining of headache  and nausea.   HISTORY OF PRESENT ILLNESS:  Please see the above.  The patient was recently  hospitalized for pregnancy-induced hypertension.   ANTEPARTUM CARE/PROBLEMS/RISKS:  Onset of care at 22 weeks at [facility].  Also diagnosed with gestational diabetes, diet controlled,  and a history of hypothyroidism.  Rule out LGA fetus.  Previous cesarean  delivery.   PRENATAL SCREEN:  Three hour GTT with one abnormal value.  Normal fasting  one-hour GCT 146.  PIH panel on June 30, 2005 within normal limits.  Hemoglobin 10.2, hematocrit 31.7, platelets 264,000, RPR nonreactive.   PAST MEDICAL HISTORY:  Please see the above.   PAST SURGICAL HISTORY:  Please see the above.   SOCIAL HISTORY:  She is a Conservation officer, nature at [employer].  She is single.  Does not  give any significant for alcohol use.  She has no significant smoking  history.  Denies illicit drug use.   FAMILY HISTORY:  Hypertension, asthma, diabetes mellitus, hypothyroidism.   ALLERGIES:  No known drug allergies.   PAST OBSTETRICAL HISTORY:  In January of 2001, she had a vaginal delivery at  37 weeks, a female, 7 pounds, 9 ounces, complicated by pregnancy-induced  hypertension.  In January of 2004, she was delivered by cesarean delivery of  a female, 5 pounds, 2 ounces, at 37 weeks.  That pregnancy was complicated by  pregnancy-induced  hypertension and gastroschisis, and she has had a  spontaneous abortion, first trimester.   PHYSICAL EXAMINATION:  VITAL SIGNS:  Blood pressure is 140s to 150s/70s to  90s.  GENERAL:  In no apparent distress.  NEUROLOGIC:  Nonfocal.  PELVIC:  Sterile vaginal exam deferred.  Fetal heart tracing reassuring.   ASSESSMENT:  Intrauterine pregnancy at 35 plus weeks with pregnancy-induced  hypertension.  Rule out severe pregnancy-induced hypertension.   PLAN:  Admission.  Heightened maternal and fetal surveillance.      Roseanna Rainbow, M.D.  Electronically Signed     LAJ/MEDQ  D:  08/19/2005  T:  08/19/2005  Job:  [id number]

## 2011-03-05 NOTE — Discharge Summary (Signed)
Miranda Fowler, Miranda Fowler             ACCOUNT NO.:  [id number]   MEDICAL RECORD NO.:  [id number]          PATIENT TYPE:  INP   LOCATION:  9147                          [facility]   PHYSICIAN:  Allie Bossier, MD        DATE OF BIRTH:  [birth year]   DATE OF ADMISSION:  01/06/2007  DATE OF DISCHARGE:  01/09/2007                               DISCHARGE SUMMARY   REASON FOR ADMISSION:  Cesarean section.   HOSPITAL COURSE:  This is a 30 year old G5, P4-0-1-4, who presented at  38-6/7 weeks for repeat low transverse Cesarean section. Please see  operative report for full details of operation. Apgars of the baby were  9 at one minute and 9 at five minutes. Mom is breast feeding. She had a  BTL for contraception. She is GBS negative, rubella immune. Blood type B  positive. Antibody negative. Her hemoglobin and hematocrit on January 07, 2007 were 8.5 and 26.6.   DISCHARGE MEDICATIONS:  1. Percocet.  2. Prenatal vitamins 1 tablet daily.  3. Ibuprofen 600 mg 1 tablet every 6 hours as needed.  4. Labetalol 200 mg b.i.d.  5. Levoxyl.  6. Colace.  7. Iron.     ______________________________  Ruthe Mannan, M.D.      Allie Bossier, MD  Electronically Signed    TA/MEDQ  D:  01/10/2007  T:  01/10/2007  Job:  [id number]

## 2011-04-02 ENCOUNTER — Other Ambulatory Visit: Payer: Self-pay | Admitting: Oncology

## 2011-04-02 ENCOUNTER — Encounter (HOSPITAL_BASED_OUTPATIENT_CLINIC_OR_DEPARTMENT_OTHER): Payer: Self-pay | Admitting: Oncology

## 2011-04-02 DIAGNOSIS — D509 Iron deficiency anemia, unspecified: Secondary | ICD-10-CM

## 2011-04-02 DIAGNOSIS — D500 Iron deficiency anemia secondary to blood loss (chronic): Secondary | ICD-10-CM

## 2011-04-02 DIAGNOSIS — N920 Excessive and frequent menstruation with regular cycle: Secondary | ICD-10-CM

## 2011-04-02 LAB — CBC WITH DIFFERENTIAL/PLATELET
Basophils Absolute: 0 10*3/uL (ref 0.0–0.1)
EOS%: 0.3 % (ref 0.0–7.0)
Eosinophils Absolute: 0 10*3/uL (ref 0.0–0.5)
HGB: 8.4 g/dL — ABNORMAL LOW (ref 11.6–15.9)
MONO%: 12.3 % (ref 0.0–14.0)
NEUT#: 1.6 10*3/uL (ref 1.5–6.5)
RBC: 3.05 10*6/uL — ABNORMAL LOW (ref 3.70–5.45)
RDW: 15.5 % — ABNORMAL HIGH (ref 11.2–14.5)
WBC: 4.5 10*3/uL (ref 3.9–10.3)
lymph#: 2.3 10*3/uL (ref 0.9–3.3)

## 2011-04-05 ENCOUNTER — Encounter (HOSPITAL_BASED_OUTPATIENT_CLINIC_OR_DEPARTMENT_OTHER): Payer: Self-pay | Admitting: Oncology

## 2011-04-05 DIAGNOSIS — D509 Iron deficiency anemia, unspecified: Secondary | ICD-10-CM

## 2011-04-05 DIAGNOSIS — N920 Excessive and frequent menstruation with regular cycle: Secondary | ICD-10-CM

## 2011-04-05 DIAGNOSIS — D500 Iron deficiency anemia secondary to blood loss (chronic): Secondary | ICD-10-CM

## 2011-04-06 LAB — COMPREHENSIVE METABOLIC PANEL
ALT: 14 U/L (ref 0–35)
AST: 30 U/L (ref 0–37)
Albumin: 4.1 g/dL (ref 3.5–5.2)
Alkaline Phosphatase: 44 U/L (ref 39–117)
Glucose, Bld: 93 mg/dL (ref 70–99)
Potassium: 3.8 mEq/L (ref 3.5–5.3)
Sodium: 139 mEq/L (ref 135–145)
Total Bilirubin: 0.4 mg/dL (ref 0.3–1.2)
Total Protein: 7.3 g/dL (ref 6.0–8.3)

## 2011-04-06 LAB — IRON AND TIBC
%SAT: 7 % — ABNORMAL LOW (ref 20–55)
Iron: 32 ug/dL — ABNORMAL LOW (ref 42–145)

## 2011-04-06 LAB — SPEP & IFE WITH QIG
Alpha-2-Globulin: 9.1 % (ref 7.1–11.8)
Gamma Globulin: 22.7 % — ABNORMAL HIGH (ref 11.1–18.8)
IgA: 96 mg/dL (ref 68–380)
IgG (Immunoglobin G), Serum: 1590 mg/dL (ref 690–1700)
Total Protein, Serum Electrophoresis: 7.3 g/dL (ref 6.0–8.3)

## 2011-04-06 LAB — VITAMIN B12: Vitamin B-12: 454 pg/mL (ref 211–911)

## 2011-04-12 ENCOUNTER — Encounter (HOSPITAL_BASED_OUTPATIENT_CLINIC_OR_DEPARTMENT_OTHER): Payer: Self-pay | Admitting: Oncology

## 2011-04-12 DIAGNOSIS — N920 Excessive and frequent menstruation with regular cycle: Secondary | ICD-10-CM

## 2011-04-12 DIAGNOSIS — D500 Iron deficiency anemia secondary to blood loss (chronic): Secondary | ICD-10-CM

## 2011-04-12 DIAGNOSIS — D509 Iron deficiency anemia, unspecified: Secondary | ICD-10-CM

## 2011-05-20 ENCOUNTER — Inpatient Hospital Stay (INDEPENDENT_AMBULATORY_CARE_PROVIDER_SITE_OTHER)
Admission: RE | Admit: 2011-05-20 | Discharge: 2011-05-20 | Disposition: A | Discharge status: Home / Self Care | Payer: Self-pay | Source: Ambulatory Visit | Attending: Family Medicine | Admitting: Family Medicine

## 2011-05-20 DIAGNOSIS — H109 Unspecified conjunctivitis: Secondary | ICD-10-CM

## 2011-07-07 ENCOUNTER — Other Ambulatory Visit: Payer: Self-pay | Admitting: Medical

## 2011-07-07 ENCOUNTER — Encounter (HOSPITAL_BASED_OUTPATIENT_CLINIC_OR_DEPARTMENT_OTHER): Payer: Self-pay | Admitting: Oncology

## 2011-07-07 DIAGNOSIS — D509 Iron deficiency anemia, unspecified: Secondary | ICD-10-CM

## 2011-07-07 DIAGNOSIS — N920 Excessive and frequent menstruation with regular cycle: Secondary | ICD-10-CM

## 2011-07-07 LAB — CBC WITH DIFFERENTIAL/PLATELET
BASO%: 0.6 % (ref 0.0–2.0)
EOS%: 0.1 % (ref 0.0–7.0)
HCT: 29.9 % — ABNORMAL LOW (ref 34.8–46.6)
MCH: 34 pg (ref 25.1–34.0)
MCHC: 33.8 g/dL (ref 31.5–36.0)
MONO#: 0.3 10*3/uL (ref 0.1–0.9)
RBC: 2.97 10*6/uL — ABNORMAL LOW (ref 3.70–5.45)
RDW: 13.9 % (ref 11.2–14.5)
WBC: 3.9 10*3/uL (ref 3.9–10.3)
lymph#: 1.8 10*3/uL (ref 0.9–3.3)

## 2011-07-07 LAB — IRON AND TIBC
%SAT: 28 % (ref 20–55)
Iron: 86 ug/dL (ref 42–145)

## 2011-07-07 LAB — COMPREHENSIVE METABOLIC PANEL
ALT: 14 U/L (ref 0–35)
AST: 29 U/L (ref 0–37)
CO2: 26 mEq/L (ref 19–32)
Calcium: 9.3 mg/dL (ref 8.4–10.5)
Chloride: 100 mEq/L (ref 96–112)
Creatinine, Ser: 1.22 mg/dL — ABNORMAL HIGH (ref 0.50–1.10)
Potassium: 3.8 mEq/L (ref 3.5–5.3)
Sodium: 136 mEq/L (ref 135–145)
Total Protein: 7.4 g/dL (ref 6.0–8.3)

## 2011-07-16 ENCOUNTER — Ambulatory Visit: Payer: Self-pay | Admitting: Family Medicine

## 2011-07-20 ENCOUNTER — Encounter: Payer: Self-pay | Admitting: Family Medicine

## 2011-07-20 ENCOUNTER — Other Ambulatory Visit (HOSPITAL_COMMUNITY)
Admission: RE | Admit: 2011-07-20 | Discharge: 2011-07-20 | Disposition: A | Discharge status: Home / Self Care | Payer: Self-pay | Source: Ambulatory Visit | Attending: Family Medicine | Admitting: Family Medicine

## 2011-07-20 ENCOUNTER — Ambulatory Visit (INDEPENDENT_AMBULATORY_CARE_PROVIDER_SITE_OTHER): Payer: Self-pay | Admitting: Family Medicine

## 2011-07-20 DIAGNOSIS — N760 Acute vaginitis: Secondary | ICD-10-CM | POA: Insufficient documentation

## 2011-07-20 DIAGNOSIS — D509 Iron deficiency anemia, unspecified: Secondary | ICD-10-CM

## 2011-07-20 DIAGNOSIS — E039 Hypothyroidism, unspecified: Secondary | ICD-10-CM

## 2011-07-20 DIAGNOSIS — Z711 Person with feared health complaint in whom no diagnosis is made: Secondary | ICD-10-CM

## 2011-07-20 DIAGNOSIS — Z01419 Encounter for gynecological examination (general) (routine) without abnormal findings: Secondary | ICD-10-CM | POA: Insufficient documentation

## 2011-07-20 DIAGNOSIS — Z23 Encounter for immunization: Secondary | ICD-10-CM

## 2011-07-20 DIAGNOSIS — Z124 Encounter for screening for malignant neoplasm of cervix: Secondary | ICD-10-CM

## 2011-07-20 LAB — POCT WET PREP (WET MOUNT)

## 2011-07-20 MED ORDER — METRONIDAZOLE 500 MG PO TABS: 500.0000 mg | ORAL_TABLET | Freq: Two times a day (BID) | ORAL | Status: AC

## 2011-07-20 NOTE — Patient Instructions (Signed)
It was good to meet you today. We'll be checking some blood tests today. Make an appointment to come back and see me in 4-6 weeks.

## 2011-07-20 NOTE — Progress Notes (Signed)
Addended by: Wauregan Cellar on: 07/20/2011 05:02 PM   Modules accepted: Orders

## 2011-07-20 NOTE — Assessment & Plan Note (Signed)
Patient has been out of her Synthroid for the past several months. This is the most likely cause of her increased fatigue and possibly cause of her muscle aches. Plan to check TSH today. She is to followup in 4-6 weeks to assess for improvement of her muscle aches. She is to continue to use ibuprofen until that time.

## 2011-07-20 NOTE — Progress Notes (Signed)
  Subjective:    Patient ID: Miranda Fowler, female    DOB: Feb 21, 1981, 30 y.o.   MRN: [id number]  HPI 1.  Preventative Med:  Patient here for complete physical exam including pap smear.    2.  Heme:  Diagnosed in the past with iron deficiency anemia. She is followed by hematology here in [place]. She receives IV iron transfusions every 6 months or so. She does report some continued fatigue for about 2-3 months. Denies any bleeding or pica.    3.  Leg/arm aches and paresthesias:  Patient describes generalized aches in her forearms as well as bilateral calves. She states this has been going on since the summer. She last received a blood transfusion on June. Describes aching pain relief with occasional ibuprofen use. Also describes some paresthesias in her calves and forearms. No dermatomal distribution.  No real weakness. No change in her gait.  4.  Hypothyroidism:  Status post thyroid radiation. She says that 200 mcg a day of Synthroid. However she has been out of her Synthroid for the past 2-3 months. Does not remember when her last TSH was. See above for review of systems   Review of Systems See HPI above for review of systems.       Objective:   Physical Exam Gen:  Alert, cooperative patient who appears stated age in no acute distress.  Vital signs reviewed. HEENT:  [place]/AT.  EOMI, PERRL.  MMM, tonsils non-erythematous, non-edematous.  External ears WNL, Bilateral TM's normal without retraction, redness or bulging.  Cardiac:  Regular rate and rhythm without murmur auscultated.  Good S1/S2. Pulm:  Clear to auscultation bilaterally with good air movement.  No wheezes or rales noted.   Abd:  Soft/nondistended/nontender.  Good bowel sounds throughout all four quadrants.  No masses noted.  Ext:  No clubbing/cyanosis/erythema.  No edema noted bilateral lower extremities.   GYN:  External genitalia within normal limits.  Vaginal mucosa pink, moist, normal rugae.  Nonfriable cervix without  lesions, no bleeding noted on speculum exam.  Moderate amount of thick, white discharge and vaginal wall edema noted.  Pap performed and wet prep obtained.  Bimanual exam revealed normal, nongravid uterus.  No cervical motion tenderness. No adnexal masses bilaterally.   MSK:  Strength 5/5 BL handgrip.  Tricep/bicep strength 5/5.  Question minimally reduced strength Right plantar flexion, otherwise bilateral low extremity strength is 5 out of 5. Neuro:  Unable to elicit patellar reflex. Achilles reflex +1 bilaterally. No decreased sensation bilateral lower extremities or upper extremities.        Assessment & Plan:

## 2011-07-20 NOTE — Assessment & Plan Note (Signed)
Continue to be followed by Heme.  Remove Integra from medication list.

## 2011-07-20 NOTE — Assessment & Plan Note (Signed)
Obtained wet prep in clinic.  Showed BV and trich.  Called and discussed with patient.  7 day course of Flagyl.

## 2011-07-21 LAB — CBC
Hemoglobin: 6.9 — CL
MCH: 32.6 pg (ref 26.0–34.0)
MCHC: 31.3 g/dL (ref 30.0–36.0)
Platelets: 262 10*3/uL (ref 150–400)
Platelets: 334
RBC: 3.22 MIL/uL — ABNORMAL LOW (ref 3.87–5.11)
RDW: 16.8 — ABNORMAL HIGH
WBC: 5

## 2011-07-21 LAB — HIV ANTIBODY (ROUTINE TESTING W REFLEX): HIV: NONREACTIVE

## 2011-07-21 LAB — BASIC METABOLIC PANEL
BUN: 13 mg/dL (ref 6–23)
Calcium: 9.2 mg/dL (ref 8.4–10.5)
Chloride: 103 mEq/L (ref 96–112)
Creat: 1.23 mg/dL — ABNORMAL HIGH (ref 0.50–1.10)
GFR calc non Af Amer: >60
Potassium: 3.6
Sodium: 138

## 2011-07-21 LAB — TSH: TSH: 85.493 u[IU]/mL — ABNORMAL HIGH (ref 0.350–4.500)

## 2011-07-23 ENCOUNTER — Telehealth: Payer: Self-pay | Admitting: Family Medicine

## 2011-07-23 LAB — WET PREP, GENITAL
Trich, Wet Prep: NONE SEEN
Yeast Wet Prep HPF POC: NONE SEEN

## 2011-07-23 LAB — POCT URINALYSIS DIP (DEVICE)
Glucose, UA: NEGATIVE mg/dL
Specific Gravity, Urine: 1.025 (ref 1.005–1.030)

## 2011-07-23 LAB — GC/CHLAMYDIA PROBE AMP, GENITAL
Chlamydia, DNA Probe: NEGATIVE
GC Probe Amp, Genital: NEGATIVE

## 2011-07-23 LAB — POCT PREGNANCY, URINE: Preg Test, Ur: NEGATIVE

## 2011-07-23 MED ORDER — LEVOTHYROXINE SODIUM 100 MCG PO TABS: 100.0000 ug | ORAL_TABLET | Freq: Every day | ORAL | Status: DC

## 2011-07-23 NOTE — Telephone Encounter (Signed)
Will forward message to Dr. Denyse Amass

## 2011-07-23 NOTE — Telephone Encounter (Signed)
Pt had TSH drawn and doctor was supposed to call in her thyroid meds based on results.  She hasn't heard anything yet and is out of her meds. [facility]- [address]

## 2011-07-23 NOTE — Telephone Encounter (Signed)
Called and left a message. Refilled Synthroid at 100 mcg dose. This is less than her initial dose however she has been not taking medicines for quite some time.  She will return to clinic in one month

## 2011-08-02 ENCOUNTER — Telehealth: Payer: Self-pay | Admitting: Family Medicine

## 2011-08-02 NOTE — Telephone Encounter (Signed)
The medication that was prescribed for Yeast infection is not working.  She would like to talk to Dr. Gwendolyn Grant about what to do next.

## 2011-08-03 ENCOUNTER — Telehealth: Payer: Self-pay | Admitting: Family Medicine

## 2011-08-03 MED ORDER — FLUCONAZOLE 150 MG PO TABS: 150.0000 mg | ORAL_TABLET | Freq: Once | ORAL | Status: AC

## 2011-08-03 MED ORDER — METRONIDAZOLE 1 % EX GEL: Freq: Every day | CUTANEOUS | Status: DC

## 2011-08-03 NOTE — Telephone Encounter (Signed)
Spoke with patient, still with discharge.  Unclear if she actually finished Metronidazole course or if she could not tolerate, patient unsure.  Will treat for yeast and do Metrogel as well.  See previous Phone Note (opened this one by accident) for actual prescriptions sent in.

## 2011-08-20 ENCOUNTER — Ambulatory Visit (INDEPENDENT_AMBULATORY_CARE_PROVIDER_SITE_OTHER): Payer: Self-pay | Admitting: Family Medicine

## 2011-08-20 ENCOUNTER — Encounter: Payer: Self-pay | Admitting: Family Medicine

## 2011-08-20 VITALS — BP 111/75 | HR 72 | Temp 98.1°F | Ht 64.0 in | Wt 178.9 lb

## 2011-08-20 DIAGNOSIS — N920 Excessive and frequent menstruation with regular cycle: Secondary | ICD-10-CM

## 2011-08-20 DIAGNOSIS — D509 Iron deficiency anemia, unspecified: Secondary | ICD-10-CM

## 2011-08-20 DIAGNOSIS — N760 Acute vaginitis: Secondary | ICD-10-CM

## 2011-08-20 DIAGNOSIS — E039 Hypothyroidism, unspecified: Secondary | ICD-10-CM

## 2011-08-20 NOTE — Assessment & Plan Note (Signed)
Much improved clinically.   Plan to retest TSH today.   FU in 2-3 months.

## 2011-08-20 NOTE — Progress Notes (Signed)
  Subjective:    Patient ID: Miranda Fowler, female    DOB: [birth year], 30 y.o.   MRN: [id number]  HPI 1.  FU hypothyroidism:  Muscle aches have totally resolved. Does still have some fatigue, see below.  Able to work at daycare again. Very happy with treatment plan and the fact that she has recovered so well.  No fevers, chills.  Weight beginning to decrease.  2.  FU vaginal discharge:  Resolved. No further symptoms. Resolved after taking fluconazole without having to try MetroGel.  3.  Anemia:  Patient with long-term chronic anemia. She is followed by hematology here at [facility].  Hgb was 10.1 in September and 10.5 October 2 here in clinic. Her next appointment hematology is November 11 which is next week. She does still complain of persistent fatigue. Also complains of persistent heavy periods. She has tried OCPs in the past without relief. Unsure if she's interested in other contraception or possible menorrhagia treatment.   Review of Systems See HPI above for review of systems.      Objective:   Physical Exam  Gen:  Alert, cooperative patient who appears stated age in no acute distress.  Vital signs reviewed. Neck: No masses or thyromegaly or limitation in range of motion.  No cervical lymphadenopathy. Cardiac:  Regular rate and rhythm without murmur auscultated.  Good S1/S2. Pulm:  Clear to auscultation bilaterally with good air movement.  No wheezes or rales noted.   Ext:  No clubbing/cyanosis/erythema.  No edema noted bilateral lower extremities.         Assessment & Plan:

## 2011-08-20 NOTE — Assessment & Plan Note (Signed)
Patient reluctant to try other OCPs today. Also reluctant to try other contraceptive measures. Did discuss with her other possibilities for treating her menorrhagia. We'll followup with her in 2-3 months after she has had some time to think about it.

## 2011-08-20 NOTE — Assessment & Plan Note (Signed)
Resolved with fluconazole  

## 2011-08-20 NOTE — Assessment & Plan Note (Signed)
Next appointment is on November 11. Patient states she received iron transfusion at that time. Encouraged to keep appointment.

## 2011-08-21 ENCOUNTER — Encounter: Payer: Self-pay | Admitting: Family Medicine

## 2011-08-21 NOTE — Progress Notes (Signed)
Addended by: Plainview Cellar on: 08/21/2011 01:28 PM   Modules accepted: Orders

## 2011-09-28 ENCOUNTER — Other Ambulatory Visit: Payer: Self-pay | Admitting: Family Medicine

## 2011-09-28 NOTE — Telephone Encounter (Signed)
Refill request

## 2011-10-20 ENCOUNTER — Telehealth: Payer: Self-pay | Admitting: Oncology

## 2011-10-20 NOTE — Telephone Encounter (Signed)
called pt with 11/24/11 appt and she is aware,appts per mosaiq     aom

## 2011-10-27 ENCOUNTER — Ambulatory Visit (INDEPENDENT_AMBULATORY_CARE_PROVIDER_SITE_OTHER): Payer: Self-pay | Admitting: Family Medicine

## 2011-10-27 VITALS — BP 114/74 | HR 62 | Temp 98.3°F | Ht 64.0 in | Wt 174.0 lb

## 2011-10-27 DIAGNOSIS — K047 Periapical abscess without sinus: Secondary | ICD-10-CM

## 2011-10-27 MED ORDER — AMOXICILLIN 500 MG PO CAPS: 500.0000 mg | ORAL_CAPSULE | Freq: Three times a day (TID) | ORAL | Status: DC

## 2011-10-27 MED ORDER — HYDROCODONE-ACETAMINOPHEN 5-500 MG PO TABS: 1.0000 | ORAL_TABLET | Freq: Three times a day (TID) | ORAL | Status: DC | PRN

## 2011-10-27 NOTE — Assessment & Plan Note (Signed)
Will treat with amox and vicodin. Dental referral made. Infectious red flags discussed will follow up prn.

## 2011-10-27 NOTE — Progress Notes (Signed)
  Subjective:    Patient ID: Miranda Fowler, female    DOB: 12-Sep-1981, 31 y.o.   MRN: [id number]  HPI L upper tooth pain x several months. Pain has gotten progressively worse. Pt states that she has not been able to eat food because of pain. Pt has noticed mouth swelling and severe pain. Pt has been using ibuprofen for pain with mild relief in sxs. Pt states that she has a remote hx/o dental pain in the past. No fevers. Last visit to the dentist was 5 years ago.  Review of Systems See HPI, otherwise ROS negative     Objective:   Physical Exam Gen: in chair, NAD HEENT: NCAT, EOMI, + L upper tooth peripheral swelling and mild purulent discharge, mild peripheral swelling.  CV: RRR, no murmurs    Assessment & Plan:

## 2011-10-27 NOTE — Patient Instructions (Signed)

## 2011-11-04 ENCOUNTER — Telehealth: Payer: Self-pay | Admitting: *Deleted

## 2011-11-04 NOTE — Telephone Encounter (Signed)
Called patient to inform of appointment with [facility], patient is already aware of appointment.Ronna Herskowitz, Rodena Medin

## 2011-11-07 ENCOUNTER — Emergency Department (HOSPITAL_COMMUNITY)
Admission: EM | Admit: 2011-11-07 | Discharge: 2011-11-07 | Disposition: A | Discharge status: Home / Self Care | Payer: Self-pay | Attending: Emergency Medicine | Admitting: Emergency Medicine

## 2011-11-07 ENCOUNTER — Encounter (HOSPITAL_COMMUNITY): Payer: Self-pay | Admitting: *Deleted

## 2011-11-07 DIAGNOSIS — E039 Hypothyroidism, unspecified: Secondary | ICD-10-CM | POA: Insufficient documentation

## 2011-11-07 DIAGNOSIS — R5381 Other malaise: Secondary | ICD-10-CM | POA: Insufficient documentation

## 2011-11-07 DIAGNOSIS — R5383 Other fatigue: Secondary | ICD-10-CM | POA: Insufficient documentation

## 2011-11-07 DIAGNOSIS — L293 Anogenital pruritus, unspecified: Secondary | ICD-10-CM | POA: Insufficient documentation

## 2011-11-07 DIAGNOSIS — N760 Acute vaginitis: Secondary | ICD-10-CM | POA: Insufficient documentation

## 2011-11-07 DIAGNOSIS — Z79899 Other long term (current) drug therapy: Secondary | ICD-10-CM | POA: Insufficient documentation

## 2011-11-07 LAB — URINALYSIS, ROUTINE W REFLEX MICROSCOPIC
Bilirubin Urine: NEGATIVE
Glucose, UA: NEGATIVE mg/dL
Ketones, ur: 15 mg/dL — AB
Protein, ur: 100 mg/dL — AB

## 2011-11-07 LAB — CBC
MCH: 31.6 pg (ref 26.0–34.0)
Platelets: 293 10*3/uL (ref 150–400)
RBC: 3.39 MIL/uL — ABNORMAL LOW (ref 3.87–5.11)

## 2011-11-07 LAB — URINE MICROSCOPIC-ADD ON

## 2011-11-07 LAB — POCT I-STAT, CHEM 8
Calcium, Ion: 1.21 mmol/L (ref 1.12–1.32)
Chloride: 107 mEq/L (ref 96–112)
HCT: 34 % — ABNORMAL LOW (ref 36.0–46.0)
TCO2: 25 mmol/L (ref 0–100)

## 2011-11-07 LAB — WET PREP, GENITAL
WBC, Wet Prep HPF POC: NONE SEEN
Yeast Wet Prep HPF POC: NONE SEEN

## 2011-11-07 LAB — POCT PREGNANCY, URINE: Preg Test, Ur: NEGATIVE

## 2011-11-07 MED ORDER — MICONAZOLE NITRATE 2 % EX CREA: TOPICAL_CREAM | Freq: Two times a day (BID) | CUTANEOUS | Status: DC

## 2011-11-07 MED ORDER — FLUCONAZOLE 150 MG PO TABS
150.0000 mg | ORAL_TABLET | ORAL | Status: AC
Start: 2011-11-07 — End: 2011-11-07
  Administered 2011-11-07: 150 mg via ORAL
  Filled 2011-11-07: qty 1

## 2011-11-07 NOTE — ED Notes (Signed)
Pelvic cart at bedside. 

## 2011-11-07 NOTE — ED Provider Notes (Signed)
History     CSN: [id number]  Arrival date & time 11/07/11  0704   First MD Initiated Contact with Patient 11/07/11 [phone]      Chief Complaint  Patient presents with  . Vaginal Discharge  . Fatigue    (Consider location/radiation/quality/duration/timing/severity/associated sxs/prior treatment) HPI  Patient with history of chronic anemia that she states requires yearly iron transfusions as well as history of hypothyroidism for which she takes daily Synthroid presents to emergency department complaining of white vaginal discharge with mild itching as well as waking this morning feeling generalized weakness. Patient states that she started an antibiotic, amoxicillin, about a week ago for a dental abscess with resolution of dental abscess but states "I always seem to get a yeast infection after taking an antibiotic." Patient states the symptoms of vaginal discharge and itching are similar to symptoms of yeast infection of the past. Patient denies dysuria, hematuria, dyspareunia, elbow pain, and abdominal pain, nausea, or vomiting. Patient states she felt well yesterday but upon waking this morning felt "just wiped out." She denies any chest pain, shortness of breath, headache, dizziness, or visual changes. Patient states she's taken her daily Synthroid as recked it every day without missing a dose. Patient is G6 P4 A2  L4 with last menstrual period being December 26. She denies any vaginal bleeding or spotting since last period which she states was her normal menstruation. Patient states that her last iron transfusion was about a year ago stating "I'm due for one here soon." Patient notes that she always has "really low hemoglobin but they tell me it is normal for me."  Past Medical History  Diagnosis Date  . Hypothyroidism     post-radiation at age 31  . Anemia     Chronic problem for patient.  Followed by Heme, receives IV iron transfusions secondary to inability to tolerate PO supplements.       History reviewed. No pertinent past surgical history.  History reviewed. No pertinent family history.  History  Substance Use Topics  . Smoking status: Never Smoker   . Smokeless tobacco: Not on file  . Alcohol Use: No    OB History    Grav Para Term Preterm Abortions TAB SAB Ect Mult Living                  Review of Systems  All other systems reviewed and are negative.    Allergies  Review of patient's allergies indicates no known allergies.  Home Medications   Current Outpatient Rx  Name Route Sig Dispense Refill  . AMOXICILLIN 500 MG PO CAPS Oral Take 500 mg by mouth 3 (three) times daily. Finish on 11/08/11    . HYDROCODONE-ACETAMINOPHEN 5-500 MG PO TABS Oral Take 1 tablet by mouth every 8 (eight) hours as needed. As needed for pain    . LEVOTHYROXINE SODIUM 100 MCG PO TABS  TAKE ONE TABLET BY MOUTH EVERY DAY 30 tablet 1    BP 117/75  Pulse 73  Temp(Src) 97.3 F (36.3 C) (Oral)  Resp 18  SpO2 100%  LMP 10/13/2011  Physical Exam  Nursing note and vitals reviewed. Constitutional: She is oriented to person, place, and time. She appears well-developed and well-nourished. No distress.  HENT:  Head: Normocephalic and atraumatic.  Eyes: Conjunctivae and EOM are normal. Pupils are equal, round, and reactive to light.  Neck: Normal range of motion. Neck supple.  Cardiovascular: Normal rate, regular rhythm, normal heart sounds and intact distal pulses.  Exam reveals  no gallop and no friction rub.   No murmur heard. Pulmonary/Chest: Effort normal and breath sounds normal. No respiratory distress. She has no wheezes. She has no rales. She exhibits no tenderness.  Abdominal: Soft. Bowel sounds are normal. She exhibits no distension and no mass. There is no tenderness. There is no rebound and no guarding.  Genitourinary: Cervix exhibits no motion tenderness, no discharge and no friability. Right adnexum displays no mass, no tenderness and no fullness. Left adnexum  displays no mass, no tenderness and no fullness. There is erythema around the vagina. No bleeding around the vagina.       Blood-tinged discharge as well as white curd-like discharge in vaginal vault with mild erythema and irritation with digital exam. no cervical motion tenderness  Musculoskeletal: Normal range of motion. She exhibits no edema and no tenderness.  Neurological: She is alert and oriented to person, place, and time.  Skin: Skin is warm and dry. No rash noted. She is not diaphoretic. No erythema.  Psychiatric: She has a normal mood and affect.    ED Course  Procedures (including critical care time)  PO diflucan for clinically dx yeast infection with patient currently finishing amoxicillin denying dysuria, hematuria or pubic pain question dirty specimen for urine catch and patient at the start of her menstruation with blood tinged d/c. Will not treat for UTI at this time.  Labs Reviewed  URINALYSIS, ROUTINE W REFLEX MICROSCOPIC - Abnormal; Notable for the following:    Color, Urine AMBER (*) BIOCHEMICALS MAY BE AFFECTED BY COLOR   APPearance TURBID (*)    Hgb urine dipstick LARGE (*)    Ketones, ur 15 (*)    Protein, ur 100 (*)    Leukocytes, UA MODERATE (*)    All other components within normal limits  CBC - Abnormal; Notable for the following:    RBC 3.39 (*)    Hemoglobin 10.7 (*)    HCT 32.6 (*)    All other components within normal limits  POCT I-STAT, CHEM 8 - Abnormal; Notable for the following:    Hemoglobin 11.6 (*)    HCT 34.0 (*)    All other components within normal limits  URINE MICROSCOPIC-ADD ON - Abnormal; Notable for the following:    Squamous Epithelial / LPF FEW (*)    Bacteria, UA FEW (*)    All other components within normal limits  WET PREP, GENITAL  POCT PREGNANCY, URINE  POCT PREGNANCY, URINE  GC/CHLAMYDIA PROBE AMP, GENITAL  I-STAT, CHEM 8   No results found.   1. Vulvovaginitis       MDM  Afebrile nontoxic-appearing, abdomen  soft nontender, no complaints or symptoms consistent with urinary tract infection however symptoms consistent with yeast infection and therefore will treat yeast infection. Patient is currently finishing an antibiotic and given no symptoms of UTI and questionable dirty specimen we'll not treat as urinary tract infection. Patient's chronic anemia with her hemoglobin a high normal for her at baseline despite being lower than the average range. Patient's generalized weakness began today with the other associated signs or symptoms and she is agreeable to following up with her primary care provider if generalized weakness persists for further evaluation however to return to emergency department for emergent changing or worsening of symptoms.        [place], [place] 11/07/11 1644

## 2011-11-07 NOTE — ED Notes (Signed)
Pt d/c home with son. Pt in nad at this time. Voiced understanding of d/c instructions.

## 2011-11-07 NOTE — ED Notes (Signed)
Reports generalized weakness and fatigue and vaginal discharge since Friday. No distress noted at triage.

## 2011-11-07 NOTE — ED Notes (Signed)
Patient undressed from wait down, pelvic cart in room and ready.

## 2011-11-08 NOTE — ED Provider Notes (Signed)
Medical screening examination/treatment/procedure(s) were performed by non-physician practitioner and as supervising physician I was immediately available for consultation/collaboration.  Flint Melter, MD 11/08/11 [phone]

## 2011-11-09 LAB — GC/CHLAMYDIA PROBE AMP, GENITAL: GC Probe Amp, Genital: NEGATIVE

## 2011-11-17 ENCOUNTER — Other Ambulatory Visit: Payer: Self-pay | Admitting: Family Medicine

## 2011-11-24 ENCOUNTER — Other Ambulatory Visit: Payer: Self-pay | Admitting: Oncology

## 2011-11-24 ENCOUNTER — Other Ambulatory Visit: Payer: Self-pay

## 2011-11-24 ENCOUNTER — Ambulatory Visit (HOSPITAL_BASED_OUTPATIENT_CLINIC_OR_DEPARTMENT_OTHER): Payer: Self-pay | Admitting: Oncology

## 2011-11-24 VITALS — BP 129/76 | HR 79 | Temp 97.0°F | Ht 64.0 in | Wt 175.0 lb

## 2011-11-24 DIAGNOSIS — D509 Iron deficiency anemia, unspecified: Secondary | ICD-10-CM

## 2011-11-24 DIAGNOSIS — D649 Anemia, unspecified: Secondary | ICD-10-CM

## 2011-11-24 LAB — CBC WITH DIFFERENTIAL/PLATELET
BASO%: 0.5 % (ref 0.0–2.0)
EOS%: 0 % (ref 0.0–7.0)
HCT: 30 % — ABNORMAL LOW (ref 34.8–46.6)
LYMPH%: 48.2 % (ref 14.0–49.7)
MCH: 30.2 pg (ref 25.1–34.0)
MCHC: 32.3 g/dL (ref 31.5–36.0)
MCV: 93.5 fL (ref 79.5–101.0)
NEUT%: 39.6 % (ref 38.4–76.8)
Platelets: 275 10*3/uL (ref 145–400)

## 2011-11-24 LAB — COMPREHENSIVE METABOLIC PANEL
Albumin: 3.9 g/dL (ref 3.5–5.2)
BUN: 14 mg/dL (ref 6–23)
Calcium: 9.1 mg/dL (ref 8.4–10.5)
Chloride: 106 mEq/L (ref 96–112)
Glucose, Bld: 106 mg/dL — ABNORMAL HIGH (ref 70–99)
Potassium: 3.7 mEq/L (ref 3.5–5.3)

## 2011-11-24 LAB — IRON AND TIBC
Iron: 30 ug/dL — ABNORMAL LOW (ref 42–145)
UIBC: 320 ug/dL (ref 125–400)

## 2011-11-24 NOTE — Progress Notes (Signed)
Hematology and Oncology Follow Up Visit  Miranda Fowler [id number] 1981-10-07 31 y.o. 11/24/2011 11:02 AM  CC: Miranda Floor, DO    Principle Diagnosis:  A 31 year old female with iron-deficiency anemia diagnosed in August 2009 who presented with hemoglobin of 7.2, ferritin of 3.   Prior Therapy: Patient was treated with iron replacement on June 14, 2008, normalization of her iron study, also repeat IV iron infusion on 01/09/2010, 01/16/2010 and most recently June 2012.  Interim History: Miranda Fowler presents today for a followup visit.  She has been doing relatively well.  She does report some constitutional symptoms at times of fatigue and tiredness.  Does not report any chest pain or difficulty breathing.  She still is having rather heavy menstrual cycles at this time.   She is not reporting any chest pain.  She did not report any hematochezia.  She did not report any melena. She is more fatigued than last time I saw her.  Medications: I have reviewed the patient's current medications. Current outpatient prescriptions:levothyroxine (SYNTHROID, LEVOTHROID) 100 MCG tablet, TAKE ONE TABLET BY MOUTH EVERY DAY, Disp: 30 tablet, Rfl: 1  Allergies: No Known Allergies  Past Medical History, Surgical history, Social history, and Family History were reviewed and updated.  Review of Systems: Constitutional:  Negative for fever, chills, night sweats, anorexia, weight loss, pain. Cardiovascular: no chest pain or dyspnea on exertion Respiratory: no cough, shortness of breath, or wheezing Neurological: no TIA or stroke symptoms Dermatological: negative ENT: negative Skin: Negative. Gastrointestinal: no abdominal pain, change in bowel habits, or black or bloody stools Genito-Urinary: no dysuria, trouble voiding, or hematuria Hematological and Lymphatic: negative Breast: negative Musculoskeletal: negative Remaining ROS negative. Physical Exam: Blood pressure 129/76, pulse 79, temperature  97 F (36.1 C), temperature source Oral, height 5\' 4"  (1.626 m), weight 175 lb (79.379 kg), last menstrual period 10/13/2011. ECOG:  General appearance: alert Head: Normocephalic, without obvious abnormality, atraumatic Neck: no adenopathy, no carotid bruit, no JVD, supple, symmetrical, trachea midline and thyroid not enlarged, symmetric, no tenderness/mass/nodules Lymph nodes: Cervical, supraclavicular, and axillary nodes normal. Heart:regular rate and rhythm, S1, S2 normal, no murmur, click, rub or gallop Lung:chest clear, no wheezing, rales, normal symmetric air entry Abdomin: soft, non-tender, without masses or organomegaly EXT:no erythema, induration, or nodules   Lab Results: Lab Results  Component Value Date   WBC 3.7* 11/24/2011   HGB 9.7* 11/24/2011   HCT 30.0* 11/24/2011   MCV 93.5 11/24/2011   PLT 275 11/24/2011     Chemistry      Component Value Date/Time   NA 143 11/07/2011 0759   K 3.9 11/07/2011 0759   CL 107 11/07/2011 0759   CO2 26 07/20/2011 1121   BUN 12 11/07/2011 0759   CREATININE 0.90 11/07/2011 0759   CREATININE 1.23* 07/20/2011 1121      Component Value Date/Time   CALCIUM 9.2 07/20/2011 1121   ALKPHOS 32* 07/07/2011 1157   ALKPHOS 32* 07/07/2011 1157   AST 29 07/07/2011 1157   AST 29 07/07/2011 1157   ALT 14 07/07/2011 1157   ALT 14 07/07/2011 1157   BILITOT 0.5 07/07/2011 1157   BILITOT 0.5 07/07/2011 1157        Impression and Plan:  A 31 year old female with the following issues:   1. Iron-deficiency anemia.  I am rechecking her ferritin level today and I will arrange for her to get Feraheme. 2. Menorrhagia.  Continues to be really the deriving factor and the sole factor where she  is developing iron-deficiency anemia.  I have instructed her to follow up with her OB/GYN regarding that.     [facility], MD 2/6/201311:02 AM

## 2011-12-01 ENCOUNTER — Ambulatory Visit: Payer: Self-pay

## 2011-12-01 ENCOUNTER — Ambulatory Visit (HOSPITAL_BASED_OUTPATIENT_CLINIC_OR_DEPARTMENT_OTHER): Payer: Self-pay

## 2011-12-01 DIAGNOSIS — N760 Acute vaginitis: Secondary | ICD-10-CM

## 2011-12-01 DIAGNOSIS — E890 Postprocedural hypothyroidism: Secondary | ICD-10-CM

## 2011-12-01 DIAGNOSIS — N920 Excessive and frequent menstruation with regular cycle: Secondary | ICD-10-CM

## 2011-12-01 DIAGNOSIS — K047 Periapical abscess without sinus: Secondary | ICD-10-CM

## 2011-12-01 DIAGNOSIS — D509 Iron deficiency anemia, unspecified: Secondary | ICD-10-CM

## 2011-12-01 DIAGNOSIS — E039 Hypothyroidism, unspecified: Secondary | ICD-10-CM

## 2011-12-01 MED ORDER — SODIUM CHLORIDE 0.9 % IJ SOLN
3.0000 mL | Freq: Once | INTRAMUSCULAR | Status: DC | PRN
  Filled 2011-12-01: qty 10

## 2011-12-01 MED ORDER — SODIUM CHLORIDE 0.9 % IV SOLN
1020.0000 mg | Freq: Once | INTRAVENOUS | Status: AC
  Administered 2011-12-01: 1020 mg via INTRAVENOUS
  Filled 2011-12-01: qty 34

## 2011-12-01 MED ORDER — SODIUM CHLORIDE 0.9 % IV SOLN
Freq: Once | INTRAVENOUS | Status: AC
  Administered 2011-12-01: 10:00:00 via INTRAVENOUS

## 2011-12-07 ENCOUNTER — Other Ambulatory Visit: Payer: Self-pay | Admitting: Family Medicine

## 2011-12-07 NOTE — Telephone Encounter (Signed)
Refill request

## 2012-01-06 ENCOUNTER — Encounter: Payer: Self-pay | Admitting: Family Medicine

## 2012-01-06 ENCOUNTER — Ambulatory Visit (INDEPENDENT_AMBULATORY_CARE_PROVIDER_SITE_OTHER): Payer: Self-pay | Admitting: Family Medicine

## 2012-01-06 VITALS — BP 108/75 | HR 71 | Temp 98.3°F | Ht 64.0 in | Wt 172.0 lb

## 2012-01-06 DIAGNOSIS — J3489 Other specified disorders of nose and nasal sinuses: Secondary | ICD-10-CM

## 2012-01-06 DIAGNOSIS — R0981 Nasal congestion: Secondary | ICD-10-CM | POA: Insufficient documentation

## 2012-01-06 NOTE — Progress Notes (Signed)
  Subjective:    Patient ID: Miranda Fowler, female    DOB: 17-Apr-1981, 31 y.o.   MRN: [id number]  HPI 31 yo here for 4 days of rhinorrhea and nasal congestion  Facial pressure, nasal congestion, clear rhinorrhea.  Also with eyes itchy, notes spring is a difficult time.  No cough, dyspnea or fever.  Taking cetrizine with minimal improvement.  Review of Systemssee HPI     Objective:   Physical Exam GEN: Alert & Oriented, No acute distress HEENT: [place]/AT. EOMI, PERRLA, no conjunctival injection or scleral icterus.  Bilateral tympanic membranes intact without erythema or effusion.  .  Nares with edema and rhinorrhea.  Oropharynx is without erythema or exudates.  No anterior or posterior cervical lymphadenopathy. CV:  Regular Rate & Rhythm, no murmur Respiratory:  Normal work of breathing, CTAB         Assessment & Plan:

## 2012-01-06 NOTE — Assessment & Plan Note (Signed)
Likely viral uri superimposed on seasonal allergies.  Advised OTC decongestant, nasal saline irrigation and to continue antihistamine. Gave red flags for signs of bacterial infection and discussed if continues rhinorrhea after URI, to let me know and would discuss nasal steroid spray

## 2012-01-06 NOTE — Patient Instructions (Signed)
Nasal saline rinse  Decongestant- sudafed available over the counter  Continue taking cetirizine for the itchiness  Let me know if you continue to have symptoms over 2 weeks, you improve but then quickly get worse, or start having fevers

## 2012-02-25 ENCOUNTER — Telehealth: Payer: Self-pay | Admitting: Oncology

## 2012-02-25 ENCOUNTER — Other Ambulatory Visit (HOSPITAL_BASED_OUTPATIENT_CLINIC_OR_DEPARTMENT_OTHER): Payer: Self-pay | Admitting: Lab

## 2012-02-25 ENCOUNTER — Ambulatory Visit (HOSPITAL_BASED_OUTPATIENT_CLINIC_OR_DEPARTMENT_OTHER): Payer: Self-pay | Admitting: Oncology

## 2012-02-25 VITALS — BP 120/84 | HR 59 | Temp 97.5°F | Ht 64.0 in | Wt 171.3 lb

## 2012-02-25 DIAGNOSIS — D509 Iron deficiency anemia, unspecified: Secondary | ICD-10-CM

## 2012-02-25 DIAGNOSIS — D649 Anemia, unspecified: Secondary | ICD-10-CM

## 2012-02-25 DIAGNOSIS — N920 Excessive and frequent menstruation with regular cycle: Secondary | ICD-10-CM

## 2012-02-25 LAB — COMPREHENSIVE METABOLIC PANEL
BUN: 11 mg/dL (ref 6–23)
CO2: 27 mEq/L (ref 19–32)
Calcium: 9 mg/dL (ref 8.4–10.5)
Chloride: 106 mEq/L (ref 96–112)
Creatinine, Ser: 0.81 mg/dL (ref 0.50–1.10)

## 2012-02-25 LAB — CBC WITH DIFFERENTIAL/PLATELET
Basophils Absolute: 0 10*3/uL (ref 0.0–0.1)
Eosinophils Absolute: 0 10*3/uL (ref 0.0–0.5)
HCT: 32.3 % — ABNORMAL LOW (ref 34.8–46.6)
HGB: 10.8 g/dL — ABNORMAL LOW (ref 11.6–15.9)
LYMPH%: 46.4 % (ref 14.0–49.7)
MONO#: 0.5 10*3/uL (ref 0.1–0.9)
NEUT#: 1.3 10*3/uL — ABNORMAL LOW (ref 1.5–6.5)
NEUT%: 37.8 % — ABNORMAL LOW (ref 38.4–76.8)
Platelets: 237 10*3/uL (ref 145–400)
WBC: 3.5 10*3/uL — ABNORMAL LOW (ref 3.9–10.3)

## 2012-02-25 LAB — IRON AND TIBC
TIBC: 218 ug/dL — ABNORMAL LOW (ref 250–470)
UIBC: 161 ug/dL (ref 125–400)

## 2012-02-25 LAB — FERRITIN: Ferritin: 77 ng/mL (ref 10–291)

## 2012-02-25 NOTE — Progress Notes (Signed)
Hematology and Oncology Follow Up Visit  Miranda Fowler [id number] 1981/04/07 31 y.o. 02/25/2012 12:14 PM  CC: Janelle Floor, DO    Principle Diagnosis:  A 31 year old female with iron-deficiency anemia diagnosed in August 2009 who presented with hemoglobin of 7.2, ferritin of 3.   Prior Therapy: Patient was treated with iron replacement on June 14, 2008, normalization of her iron study, also repeat IV iron infusion on 01/09/2010, 01/16/2010, 6/ 2012 and most recently on 11/2011.  Interim History: Ms. Molzahn presents today for a followup visit.  She has been doing relatively well.  She does report some constitutional symptoms at times of fatigue and tiredness.  Does not report any chest pain or difficulty breathing.  She still is having rather heavy menstrual cycles at this time.   She is not reporting any chest pain.  She did not report any hematochezia.  She did not report any melena. She is less fatigued than last time I saw her. She tolerated her last IV Fe replacement without complications.   Medications: I have reviewed the patient's current medications. Current outpatient prescriptions:levothyroxine (SYNTHROID, LEVOTHROID) 100 MCG tablet, TAKE ONE TABLET BY MOUTH EVERY DAY, Disp: 30 tablet, Rfl: 1  Allergies: No Known Allergies  Past Medical History, Surgical history, Social history, and Family History were reviewed and updated.  Review of Systems: Constitutional:  Negative for fever, chills, night sweats, anorexia, weight loss, pain. Cardiovascular: no chest pain or dyspnea on exertion Respiratory: no cough, shortness of breath, or wheezing Neurological: no TIA or stroke symptoms Dermatological: negative ENT: negative Skin: Negative. Gastrointestinal: no abdominal pain, change in bowel habits, or black or bloody stools Genito-Urinary: no dysuria, trouble voiding, or hematuria Hematological and Lymphatic: negative Breast: negative Musculoskeletal: negative Remaining  ROS negative. Physical Exam: Blood pressure 120/84, pulse 59, temperature 97.5 F (36.4 C), temperature source Oral, height 5\' 4"  (1.626 m), weight 171 lb 4.8 oz (77.701 kg). ECOG: 0 General appearance: alert Head: Normocephalic, without obvious abnormality, atraumatic Neck: no adenopathy, no carotid bruit, no JVD, supple, symmetrical, trachea midline and thyroid not enlarged, symmetric, no tenderness/mass/nodules Lymph nodes: Cervical, supraclavicular, and axillary nodes normal. Heart:regular rate and rhythm, S1, S2 normal, no murmur, click, rub or gallop Lung:chest clear, no wheezing, rales, normal symmetric air entry Abdomin: soft, non-tender, without masses or organomegaly EXT:no erythema, induration, or nodules   Lab Results: Lab Results  Component Value Date   WBC 3.5* 02/25/2012   HGB 10.8* 02/25/2012   HCT 32.3* 02/25/2012   MCV 98.8 02/25/2012   PLT 237 02/25/2012     Chemistry      Component Value Date/Time   NA 138 11/24/2011 1025   K 3.7 11/24/2011 1025   CL 106 11/24/2011 1025   CO2 22 11/24/2011 1025   BUN 14 11/24/2011 1025   CREATININE 0.82 11/24/2011 1025   CREATININE 1.23* 07/20/2011 1121      Component Value Date/Time   CALCIUM 9.1 11/24/2011 1025   ALKPHOS 46 11/24/2011 1025   AST 15 11/24/2011 1025   ALT 8 11/24/2011 1025   BILITOT 0.4 11/24/2011 1025        Impression and Plan:  A 31 year old female with the following issues:   1. Iron-deficiency anemia.  I am rechecking her ferritin level today and repeat it in 3 months. She might need replacement again then.  2. Menorrhagia.  Continues to be really the deriving factor and the sole factor where she is developing iron-deficiency anemia.  I have instructed her to  follow up with her OB/GYN regarding that.     Eli Hose, MD 5/10/201312:14 PM

## 2012-02-25 NOTE — Telephone Encounter (Signed)
Gave pt appt calendar for August 2013 lab and MD 

## 2012-03-06 ENCOUNTER — Other Ambulatory Visit: Payer: Self-pay

## 2012-03-06 DIAGNOSIS — E039 Hypothyroidism, unspecified: Secondary | ICD-10-CM

## 2012-03-06 NOTE — Progress Notes (Signed)
TSH DONE TODAY Talajah Slimp 

## 2012-03-08 ENCOUNTER — Telehealth: Payer: Self-pay | Admitting: Family Medicine

## 2012-03-08 NOTE — Telephone Encounter (Signed)
Forwarded to Dr. Denyse Amass.Miranda Fowler

## 2012-03-08 NOTE — Telephone Encounter (Signed)
Patient is calling for results from lab work on Monday.

## 2012-03-09 NOTE — Telephone Encounter (Signed)
Pt is asking for results due to her needing to get her meds re adjusted.  Would like to hear back today.Marland KitchenMarland Kitchen

## 2012-03-14 ENCOUNTER — Other Ambulatory Visit: Payer: Self-pay | Admitting: Family Medicine

## 2012-03-14 MED ORDER — LEVOTHYROXINE SODIUM 125 MCG PO TABS: 125.0000 ug | ORAL_TABLET | Freq: Every day | ORAL | Status: DC

## 2012-03-14 NOTE — Telephone Encounter (Signed)
Patient has called for several days, then gone through a long weekend and not heard back anything.  She really needs this resolved today or at least to hear from someone today please.

## 2012-03-14 NOTE — Telephone Encounter (Signed)
Called patient discussed lab results. TSH elevated to 12 up from 6 at last check. Patient endorses positive mood swings, occasional forgetfulness, some occasional decreased energy, positive hair loss, positive cold intolerance. Will increase patient lives at levothyroxine to 125 mcg daily. Patient to return for recheck of TSH in approximately 8 weeks. Patient to call to make an appointment in June to followup with primary care MD- Dr. Marcene Duos forgetfulness and mood swings.

## 2012-03-20 NOTE — Telephone Encounter (Signed)
Appreciate patient care.  Agree with plan, I will see her at her appointment and we can retest her TSH then depending on symptoms.

## 2012-05-25 ENCOUNTER — Encounter: Payer: Self-pay | Admitting: Family Medicine

## 2012-05-26 ENCOUNTER — Other Ambulatory Visit: Payer: Self-pay | Admitting: Lab

## 2012-05-26 ENCOUNTER — Ambulatory Visit: Payer: Self-pay | Admitting: Oncology

## 2012-05-29 ENCOUNTER — Telehealth: Payer: Self-pay | Admitting: Oncology

## 2012-05-29 ENCOUNTER — Other Ambulatory Visit (HOSPITAL_BASED_OUTPATIENT_CLINIC_OR_DEPARTMENT_OTHER): Payer: Self-pay | Admitting: Lab

## 2012-05-29 ENCOUNTER — Ambulatory Visit (HOSPITAL_BASED_OUTPATIENT_CLINIC_OR_DEPARTMENT_OTHER): Payer: Self-pay | Admitting: Oncology

## 2012-05-29 ENCOUNTER — Encounter: Payer: Self-pay | Admitting: Oncology

## 2012-05-29 VITALS — BP 109/73 | HR 62 | Temp 97.0°F | Resp 20 | Ht 64.0 in | Wt 173.6 lb

## 2012-05-29 DIAGNOSIS — D509 Iron deficiency anemia, unspecified: Secondary | ICD-10-CM

## 2012-05-29 DIAGNOSIS — N920 Excessive and frequent menstruation with regular cycle: Secondary | ICD-10-CM

## 2012-05-29 DIAGNOSIS — D649 Anemia, unspecified: Secondary | ICD-10-CM

## 2012-05-29 LAB — IRON AND TIBC
%SAT: 19 % — ABNORMAL LOW (ref 20–55)
Iron: 55 ug/dL (ref 42–145)

## 2012-05-29 LAB — CBC WITH DIFFERENTIAL/PLATELET
Eosinophils Absolute: 0 10*3/uL (ref 0.0–0.5)
HCT: 33.8 % — ABNORMAL LOW (ref 34.8–46.6)
LYMPH%: 41 % (ref 14.0–49.7)
MCHC: 33.6 g/dL (ref 31.5–36.0)
MCV: 98.6 fL (ref 79.5–101.0)
MONO%: 15.8 % — ABNORMAL HIGH (ref 0.0–14.0)
NEUT#: 1.6 10*3/uL (ref 1.5–6.5)
NEUT%: 42.1 % (ref 38.4–76.8)
Platelets: 280 10*3/uL (ref 145–400)
RBC: 3.43 10*6/uL — ABNORMAL LOW (ref 3.70–5.45)

## 2012-05-29 LAB — FERRITIN: Ferritin: 7 ng/mL — ABNORMAL LOW (ref 10–291)

## 2012-05-29 NOTE — Progress Notes (Signed)
Hematology and Oncology Follow Up Visit  DIANNAH RINDFLEISCH [id number] Dec 29, 1980 31 y.o. 05/29/2012 1:19 PM  CC: Janelle Floor, DO    Principle Diagnosis:  A 31 year old female with iron-deficiency anemia diagnosed in August 2009 who presented with hemoglobin of 7.2, ferritin of 3.   Prior Therapy: Patient was treated with iron replacement on June 14, 2008, normalization of her iron study, also repeat IV iron infusion on 01/09/2010, 01/16/2010, 6/ 2012 and most recently on 11/2011.  Interim History: Ms. Deguire presents today for a followup visit.  She has been doing relatively well.  She does report some constitutional symptoms at times of fatigue and tiredness.  Does not report any chest pain or difficulty breathing.  She still is having rather heavy menstrual cycles at this time.   She is not reporting any chest pain.  She did not report any hematochezia.  She did not report any melena. She tolerated her last IV Fe replacement without complications.   Medications: I have reviewed the patient's current medications. Current outpatient prescriptions:levothyroxine (SYNTHROID, LEVOTHROID) 125 MCG tablet, Take 1 tablet (125 mcg total) by mouth daily., Disp: 30 tablet, Rfl: 2  Allergies: No Known Allergies  Past Medical History, Surgical history, Social history, and Family History were reviewed and updated.  Review of Systems: Constitutional:  Negative for fever, chills, night sweats, anorexia, weight loss, pain. Cardiovascular: no chest pain or dyspnea on exertion Respiratory: no cough, shortness of breath, or wheezing Neurological: no TIA or stroke symptoms Dermatological: negative ENT: negative Skin: Negative. Gastrointestinal: no abdominal pain, change in bowel habits, or black or bloody stools Genito-Urinary: no dysuria, trouble voiding, or hematuria Hematological and Lymphatic: negative Breast: negative Musculoskeletal: negative Remaining ROS negative.  Physical Exam: Blood  pressure 109/73, pulse 62, temperature 97 F (36.1 C), temperature source Oral, resp. rate 20, height 5\' 4"  (1.626 m), weight 173 lb 9.6 oz (78.744 kg). ECOG: 0 General appearance: alert Head: Normocephalic, without obvious abnormality, atraumatic Neck: no adenopathy, no carotid bruit, no JVD, supple, symmetrical, trachea midline and thyroid not enlarged, symmetric, no tenderness/mass/nodules Lymph nodes: Cervical, supraclavicular, and axillary nodes normal. Heart:regular rate and rhythm, S1, S2 normal, no murmur, click, rub or gallop Lung:chest clear, no wheezing, rales, normal symmetric air entry Abdomen: soft, non-tender, without masses or organomegaly EXT:no erythema, induration, or nodules   Lab Results: Lab Results  Component Value Date   WBC 3.7* 05/29/2012   HGB 11.4* 05/29/2012   HCT 33.8* 05/29/2012   MCV 98.6 05/29/2012   PLT 280 05/29/2012     Chemistry      Component Value Date/Time   NA 140 02/25/2012 1149   K 4.0 02/25/2012 1149   CL 106 02/25/2012 1149   CO2 27 02/25/2012 1149   BUN 11 02/25/2012 1149   CREATININE 0.81 02/25/2012 1149   CREATININE 1.23* 07/20/2011 1121      Component Value Date/Time   CALCIUM 9.0 02/25/2012 1149   ALKPHOS 46 02/25/2012 1149   AST 18 02/25/2012 1149   ALT 8 02/25/2012 1149   BILITOT 0.6 02/25/2012 1149      Impression and Plan:  A 31 year old female with the following issues:   1. Iron-deficiency anemia.  I am rechecking her ferritin level today and repeat it in 3 months. Will await results from today. We will contact her if she needs additional IV iron. I have given her a list of iron rich foods. She cannot take oral iron due to GI symptoms.  2. Menorrhagia.  Continues  to be really the deriving factor and the sole factor where she is developing iron-deficiency anemia.  I have instructed her to follow up with her OB/GYN regarding that.    3. Follow-up. In 3 months.  Clenton Pare 8/12/20131:19 PM

## 2012-05-29 NOTE — Patient Instructions (Addendum)
Iron-Rich Diet  An iron-rich diet contains foods that are good sources of iron. Iron is an important mineral that helps your body produce hemoglobin. Hemoglobin is a protein in red blood cells that carries oxygen to the body's tissues. Sometimes, the iron level in your blood can be low. This may be caused by:   A lack of iron in your diet.   Blood loss.   Times of growth, such as during pregnancy or during a child's growth and development.  Low levels of iron can cause a decrease in the number of red blood cells. This can result in iron deficiency anemia. Iron deficiency anemia symptoms include:   Tiredness.   Weakness.   Irritability.   Increased chance of infection.  Here are some recommendations for daily iron intake:   Males older than 31 years of age need 8 mg of iron per day.   Women ages 19 to 50 need 18 mg of iron per day.   Pregnant women need 27 mg of iron per day, and women who are over 19 years of age and breastfeeding need 9 mg of iron per day.   Women over the age of 50 need 8 mg of iron per day.  SOURCES OF IRON  There are 2 types of iron that are found in food: heme iron and nonheme iron. Heme iron is absorbed by the body better than nonheme iron. Heme iron is found in meat, poultry, and fish. Nonheme iron is found in grains, beans, and vegetables.  Heme Iron Sources  Food / Iron (mg)   Chicken liver, 3 oz (85 g)/ 10 mg   Beef liver, 3 oz (85 g)/ 5.5 mg   Oysters, 3 oz (85 g)/ 8 mg   Beef, 3 oz (85 g)/ 2 to 3 mg   Shrimp, 3 oz (85 g)/ 2.8 mg   Turkey, 3 oz (85 g)/ 2 mg   Chicken, 3 oz (85 g) / 1 mg   Fish (tuna, halibut), 3 oz (85 g)/ 1 mg   Pork, 3 oz (85 g)/ 0.9 mg  Nonheme Iron Sources  Food / Iron (mg)   Ready-to-eat breakfast cereal, iron-fortified / 3.9 to 7 mg   Tofu,  cup / 3.4 mg   Kidney beans,  cup / 2.6 mg   Baked potato with skin / 2.7 mg   Asparagus,  cup / 2.2 mg   Avocado / 2 mg   Dried peaches,  cup / 1.6 mg   Raisins,  cup / 1.5 mg   Soy milk, 1 cup  / 1.5 mg   Whole-wheat bread, 1 slice / 1.2 mg   Spinach, 1 cup / 0.8 mg   Broccoli,  cup / 0.6 mg  IRON ABSORPTION  Certain foods can decrease the body's absorption of iron. Try to avoid these foods and beverages while eating meals with iron-containing foods:   Coffee.   Tea.   Fiber.   Soy.  Foods containing vitamin C can help increase the amount of iron your body absorbs from iron sources, especially from nonheme sources. Eat foods with vitamin C along with iron-containing foods to increase your iron absorption. Foods that are high in vitamin C include many fruits and vegetables. Some good sources are:   Fresh orange juice.   Oranges.   Strawberries.   Mangoes.   Grapefruit.   Red bell peppers.   Green bell peppers.   Broccoli.   Potatoes with skin.   Tomato juice.  Document   Released: 05/18/2005 Document Revised: 09/23/2011 Document Reviewed: 03/25/2011  [employer] Patient Information 2012 [employer].

## 2012-05-29 NOTE — Telephone Encounter (Signed)
Gave pt appt calendar  for for November 2013 lab and MD

## 2012-05-31 ENCOUNTER — Other Ambulatory Visit: Payer: Self-pay | Admitting: Oncology

## 2012-06-02 ENCOUNTER — Telehealth: Payer: Self-pay | Admitting: Oncology

## 2012-06-02 ENCOUNTER — Telehealth: Payer: Self-pay | Admitting: *Deleted

## 2012-06-02 NOTE — Telephone Encounter (Signed)
Per staff message and POF I have scheduled appts.  [name]  

## 2012-06-02 NOTE — Telephone Encounter (Signed)
S/w the pt and she is aware of her iron infusion appt on 06/09/2012

## 2012-06-09 ENCOUNTER — Ambulatory Visit (HOSPITAL_BASED_OUTPATIENT_CLINIC_OR_DEPARTMENT_OTHER): Payer: Self-pay

## 2012-06-09 VITALS — BP 118/68 | HR 67 | Temp 98.0°F | Resp 20

## 2012-06-09 DIAGNOSIS — D509 Iron deficiency anemia, unspecified: Secondary | ICD-10-CM

## 2012-06-09 MED ORDER — SODIUM CHLORIDE 0.9 % IV SOLN
1020.0000 mg | Freq: Once | INTRAVENOUS | Status: AC
  Administered 2012-06-09: 1020 mg via INTRAVENOUS
  Filled 2012-06-09: qty 34

## 2012-06-22 ENCOUNTER — Encounter: Payer: Self-pay | Admitting: Family Medicine

## 2012-06-26 ENCOUNTER — Encounter: Payer: Self-pay | Admitting: Family Medicine

## 2012-08-15 ENCOUNTER — Telehealth: Payer: Self-pay | Admitting: Family Medicine

## 2012-08-15 NOTE — Telephone Encounter (Signed)
Ms. Braver need refill on her Levothyroxine sent to [facility] on [address]  Tried to complete process for her [employer], but need to return with further info.  Need to see provider and will make an appt, but understand that she will need to keep before provider refill more meds.

## 2012-08-22 MED ORDER — LEVOTHYROXINE SODIUM 125 MCG PO TABS: 125.0000 ug | ORAL_TABLET | Freq: Every day | ORAL | Status: DC

## 2012-08-22 NOTE — Telephone Encounter (Signed)
Sent script to pharmacy. 

## 2012-08-29 ENCOUNTER — Telehealth: Payer: Self-pay | Admitting: Oncology

## 2012-08-29 ENCOUNTER — Ambulatory Visit (HOSPITAL_BASED_OUTPATIENT_CLINIC_OR_DEPARTMENT_OTHER): Payer: Self-pay | Admitting: Oncology

## 2012-08-29 ENCOUNTER — Other Ambulatory Visit (HOSPITAL_BASED_OUTPATIENT_CLINIC_OR_DEPARTMENT_OTHER): Payer: Self-pay | Admitting: Lab

## 2012-08-29 ENCOUNTER — Telehealth: Payer: Self-pay | Admitting: *Deleted

## 2012-08-29 VITALS — BP 113/73 | HR 75 | Temp 98.2°F | Resp 20 | Ht 64.0 in | Wt 178.9 lb

## 2012-08-29 DIAGNOSIS — D509 Iron deficiency anemia, unspecified: Secondary | ICD-10-CM

## 2012-08-29 DIAGNOSIS — D500 Iron deficiency anemia secondary to blood loss (chronic): Secondary | ICD-10-CM

## 2012-08-29 DIAGNOSIS — N920 Excessive and frequent menstruation with regular cycle: Secondary | ICD-10-CM

## 2012-08-29 LAB — CBC WITH DIFFERENTIAL/PLATELET
BASO%: 1 % (ref 0.0–2.0)
Basophils Absolute: 0 10*3/uL (ref 0.0–0.1)
EOS%: 0.1 % (ref 0.0–7.0)
HGB: 10.9 g/dL — ABNORMAL LOW (ref 11.6–15.9)
MCH: 35.2 pg — ABNORMAL HIGH (ref 25.1–34.0)
MCHC: 34.4 g/dL (ref 31.5–36.0)
RDW: 13 % (ref 11.2–14.5)
lymph#: 1.7 10*3/uL (ref 0.9–3.3)

## 2012-08-29 LAB — IRON AND TIBC
TIBC: 249 ug/dL — ABNORMAL LOW (ref 250–470)
UIBC: 152 ug/dL (ref 125–400)

## 2012-08-29 NOTE — Progress Notes (Signed)
Patient given signed excuse note for school this am d/t dr appt.

## 2012-08-29 NOTE — Progress Notes (Signed)
Hematology and Oncology Follow Up Visit  Miranda Fowler [id number] [birth year] 31 y.o. 08/29/2012 9:50 AM  CC: Miranda Floor, DO    Principle Diagnosis:  A 31 year old female with iron-deficiency anemia diagnosed in August 2009 who presented with hemoglobin of 7.2, ferritin of 3.  Prior Therapy: Patient was treated with iron replacement on June 14, 2008, normalization of her iron study, also repeat IV iron infusion on 01/09/2010, 01/16/2010, 6/ 2012 and most recently on 11/2011 and 05/2012.  Interim History: Miranda Fowler presents today for a followup visit.  She has been doing relatively well.  She does report some constitutional symptoms at times of fatigue and tiredness.  Does not report any chest pain or difficulty breathing.  She still is having rather heavy menstrual cycles at this time.   She is not reporting any chest pain.  She did not report any hematochezia.  She did not report any melena. She tolerated her last IV Fe replacement without complications.   Medications: I have reviewed the patient's current medications. Current outpatient prescriptions:levothyroxine (SYNTHROID, LEVOTHROID) 125 MCG tablet, Take 1 tablet (125 mcg total) by mouth daily., Disp: 30 tablet, Rfl: 3  Allergies: No Known Allergies  Past Medical History, Surgical history, Social history, and Family History were reviewed and updated.  Review of Systems: Constitutional:  Negative for fever, chills, night sweats, anorexia, weight loss, pain. Cardiovascular: no chest pain or dyspnea on exertion Respiratory: no cough, shortness of breath, or wheezing Neurological: no TIA or stroke symptoms Dermatological: negative ENT: negative Skin: Negative. Gastrointestinal: no abdominal pain, change in bowel habits, or black or bloody stools Genito-Urinary: no dysuria, trouble voiding, or hematuria Hematological and Lymphatic: negative Breast: negative Musculoskeletal: negative Remaining ROS negative.  Physical  Exam: Blood pressure 113/73, pulse 75, temperature 98.2 F (36.8 C), temperature source Oral, resp. rate 20, height 5\' 4"  (1.626 m), weight 178 lb 14.4 oz (81.149 kg). ECOG: 0 General appearance: alert Head: Normocephalic, without obvious abnormality, atraumatic Neck: no adenopathy, no carotid bruit, no JVD, supple, symmetrical, trachea midline and thyroid not enlarged, symmetric, no tenderness/mass/nodules Lymph nodes: Cervical, supraclavicular, and axillary nodes normal. Heart:regular rate and rhythm, S1, S2 normal, no murmur, click, rub or gallop Lung:chest clear, no wheezing, rales, normal symmetric air entry Abdomen: soft, non-tender, without masses or organomegaly EXT:no erythema, induration, or nodules   Lab Results: Lab Results  Component Value Date   WBC 3.7* 08/29/2012   HGB 10.9* 08/29/2012   HCT 31.6* 08/29/2012   MCV 102.2* 08/29/2012   PLT 240 08/29/2012     Chemistry      Component Value Date/Time   NA 140 02/25/2012 1149   K 4.0 02/25/2012 1149   CL 106 02/25/2012 1149   CO2 27 02/25/2012 1149   BUN 11 02/25/2012 1149   CREATININE 0.81 02/25/2012 1149   CREATININE 1.23* 07/20/2011 1121      Component Value Date/Time   CALCIUM 9.0 02/25/2012 1149   ALKPHOS 46 02/25/2012 1149   AST 18 02/25/2012 1149   ALT 8 02/25/2012 1149   BILITOT 0.6 02/25/2012 1149      Impression and Plan:  A 31 year old female with the following issues:   1. Iron-deficiency anemia.  I am rechecking her ferritin level today and repeat it in 3 months. She appears to be low again on Fe. I will arrange for her to have IV Fe again. She cannot take oral iron due to GI symptoms.  2. Menorrhagia.  Continues to be really the deriving factor  and the sole factor where she is developing iron-deficiency anemia.  I have instructed her to follow up with her OB/GYN regarding that.    3. Follow-up. In 3 months.  Miranda Fowler 11/12/20139:50 AM

## 2012-08-29 NOTE — Telephone Encounter (Signed)
gv and printed pt appt schedule for NOV for infusion and FEB 2014 for lab and appt.

## 2012-08-29 NOTE — Telephone Encounter (Signed)
Per POF I have scheduled appts. [name]  

## 2012-09-01 ENCOUNTER — Ambulatory Visit (HOSPITAL_BASED_OUTPATIENT_CLINIC_OR_DEPARTMENT_OTHER): Payer: Self-pay

## 2012-09-01 VITALS — BP 101/71 | HR 62 | Temp 98.5°F

## 2012-09-01 DIAGNOSIS — E890 Postprocedural hypothyroidism: Secondary | ICD-10-CM

## 2012-09-01 DIAGNOSIS — E039 Hypothyroidism, unspecified: Secondary | ICD-10-CM

## 2012-09-01 DIAGNOSIS — N920 Excessive and frequent menstruation with regular cycle: Secondary | ICD-10-CM

## 2012-09-01 DIAGNOSIS — D509 Iron deficiency anemia, unspecified: Secondary | ICD-10-CM

## 2012-09-01 MED ORDER — FERUMOXYTOL INJECTION 510 MG/17 ML
1020.0000 mg | Freq: Once | INTRAVENOUS | Status: AC
  Administered 2012-09-01: 1020 mg via INTRAVENOUS
  Filled 2012-09-01: qty 34

## 2012-09-01 MED ORDER — SODIUM CHLORIDE 0.9 % IV SOLN
Freq: Once | INTRAVENOUS | Status: AC
  Administered 2012-09-01: 15:00:00 via INTRAVENOUS

## 2012-09-01 NOTE — Patient Instructions (Signed)
Ferumoxytol injection What is this medicine? FERUMOXYTOL is an iron complex. Iron is used to make healthy red blood cells, which carry oxygen and nutrients throughout the body. This medicine is used to treat iron deficiency anemia in people with chronic kidney disease. This medicine may be used for other purposes; ask your health care provider or pharmacist if you have questions. What should I tell my health care provider before I take this medicine? They need to know if you have any of these conditions: -anemia not caused by low iron levels -high levels of iron in the blood -magnetic resonance imaging (MRI) test scheduled -an unusual or allergic reaction to iron, other medicines, foods, dyes, or preservatives -pregnant or trying to get pregnant -breast-feeding How should I use this medicine? This medicine is for infusion into a vein. It is given by a health care professional in a hospital or clinic setting. Talk to your pediatrician regarding the use of this medicine in children. Special care may be needed. Overdosage: If you think you've taken too much of this medicine contact a poison control center or emergency room at once. Overdosage: If you think you have taken too much of this medicine contact a poison control center or emergency room at once. NOTE: This medicine is only for you. Do not share this medicine with others. What if I miss a dose? It is important not to miss your dose. Call your doctor or health care professional if you are unable to keep an appointment. What may interact with this medicine? This medicine may interact with the following medications: -other iron products This list may not describe all possible interactions. Give your health care provider a list of all the medicines, herbs, non-prescription drugs, or dietary supplements you use. Also tell them if you smoke, drink alcohol, or use illegal drugs. Some items may interact with your medicine. What should I watch  for while using this medicine? Visit your doctor or healthcare professional regularly. Tell your doctor or healthcare professional if your symptoms do not start to get better or if they get worse. You may need blood work done while you are taking this medicine. You may need to follow a special diet. Talk to your doctor. Foods that contain iron include: whole grains/cereals, dried fruits, beans, or peas, leafy green vegetables, and organ meats (liver, kidney). What side effects may I notice from receiving this medicine? Side effects that you should report to your doctor or health care professional as soon as possible: -allergic reactions like skin rash, itching or hives, swelling of the face, lips, or tongue -breathing problems -changes in blood pressure -feeling faint or lightheaded, falls -fever or chills -flushing, sweating, or hot feelings -swelling of the ankles or feet Side effects that usually do not require medical attention (Report these to your doctor or health care professional if they continue or are bothersome.): -diarrhea -headache -nausea, vomiting -stomach pain This list may not describe all possible side effects. Call your doctor for medical advice about side effects. You may report side effects to FDA at 1-800-FDA-1088. Where should I keep my medicine? This drug is given in a hospital or clinic and will not be stored at home. NOTE: This sheet is a summary. It may not cover all possible information. If you have questions about this medicine, talk to your doctor, pharmacist, or health care provider.  2012, [employer]vier/Gold Standard. (06/26/2008 9:48:25 PM) 

## 2012-09-07 ENCOUNTER — Ambulatory Visit (INDEPENDENT_AMBULATORY_CARE_PROVIDER_SITE_OTHER): Payer: Self-pay | Admitting: Family Medicine

## 2012-09-07 ENCOUNTER — Encounter: Payer: Self-pay | Admitting: Family Medicine

## 2012-09-07 VITALS — BP 131/79 | HR 73 | Ht 64.0 in | Wt 176.0 lb

## 2012-09-07 DIAGNOSIS — N920 Excessive and frequent menstruation with regular cycle: Secondary | ICD-10-CM

## 2012-09-07 DIAGNOSIS — E039 Hypothyroidism, unspecified: Secondary | ICD-10-CM

## 2012-09-07 DIAGNOSIS — E890 Postprocedural hypothyroidism: Secondary | ICD-10-CM

## 2012-09-07 DIAGNOSIS — D509 Iron deficiency anemia, unspecified: Secondary | ICD-10-CM

## 2012-09-07 NOTE — Assessment & Plan Note (Signed)
Discussed treatment options with patient (OCP's, IUD, Ablation, Hysterectomy).  Patient currently undecided.  Patient to follow up as needed.

## 2012-09-07 NOTE — Assessment & Plan Note (Signed)
Will continue Synthroid 125 mcg daily.  Will check TSH in 3-4 weeks and titrate therapy as needed.

## 2012-09-07 NOTE — Progress Notes (Signed)
Subjective:     Patient ID: Miranda Fowler, female   DOB: [birth year], 31 y.o.   MRN: [id number]  HPI 31 year old [id number] presents for follow up regarding hypothyroidism.  Patient also has a history of menorrhagia and iron deficiency anemia. Anemia managed by Dr. Clelia Croft (Heme/Onc).  1) Hypothyroidism - Patient was off Synthroid from August to the end of October due to insurance issues.  Patient restarted Synthroid October 29th. - Last TSH was 12.603 (02/2012) - ROS: patient reports fatigue (over the past month), cold intolerance.  Denies constipation, weight gain.    2) Iron deficiency anemia - Managed by Dr. Clelia Croft. Patient receives regular IV iron treatments - Last anemia panel (08/29/12) revealed normal Iron of 97 and Ferritin of 92 - ROS: reports fatigue  3) Menorrhagia - Patient has a longstanding history of menorrhagia.   - Has regular menstrual bleeding, ~ 30 days, lasts for 5 days.  Patient wears multiple pads at once and often passes large clots. - Patient does not desire fertility as she has already had a tubal ligation. - Patient has tried OCP's once before with no improvement.  Review of Systems See HPI    Objective:   Physical Exam General: well appearing female in NAD. Heart: RRR. No murmurs, rubs, or gallops. Lungs: CTAB. No rales, rhonchi, or wheeze. Abdomen: soft, nontender, nondistended.  Extremities: no cyanosis, clubbing, or edema appreciated.     Assessment:      Plan:

## 2012-09-07 NOTE — Assessment & Plan Note (Signed)
Followed by Heme/Onc.  Last Iron and Ferritin were WNL.

## 2012-09-07 NOTE — Patient Instructions (Addendum)
It was nice seeing you today.  You can return in 3-4 weeks for thyroid check (lab).  If you decide on treatment for your heavy bleeding let me know and we can arrange it.  If you need anything in the meantime, please call the clinic.  I will see you back in 6 months.

## 2012-10-30 ENCOUNTER — Other Ambulatory Visit: Payer: Self-pay

## 2012-10-30 NOTE — Progress Notes (Unsigned)
PT CAME IN TO HAVE TSH DONE BUT LEFT BEFORE SHE WAS CALLED TO LAB SAID SHE WOULD COME BACK THIS AFTERNOON BUT WAS A NO SHOW. REORDERED TSH AS FUTURE LAB. Miranda Fowler

## 2012-11-08 ENCOUNTER — Other Ambulatory Visit: Payer: No Typology Code available for payment source

## 2012-11-08 DIAGNOSIS — E039 Hypothyroidism, unspecified: Secondary | ICD-10-CM

## 2012-11-08 LAB — TSH: TSH: 11.678 u[IU]/mL — ABNORMAL HIGH (ref 0.350–4.500)

## 2012-11-08 NOTE — Progress Notes (Signed)
TSH DONE TODAY Tattiana Fakhouri 

## 2012-11-13 ENCOUNTER — Other Ambulatory Visit: Payer: Self-pay | Admitting: Family Medicine

## 2012-11-13 ENCOUNTER — Telehealth: Payer: Self-pay | Admitting: Family Medicine

## 2012-11-13 MED ORDER — LEVOTHYROXINE SODIUM 150 MCG PO TABS: 150.0000 ug | ORAL_TABLET | Freq: Every day | ORAL | Status: DC

## 2012-11-13 NOTE — Telephone Encounter (Signed)
-----   Message from Tommie Sams, DO sent at 11/13/2012 12:05 PM ----- Will someone attempt to call this patient again for me. I have increased her Synthroid as her TSH is still elevated.  LVM for patient to call back to inform of message

## 2012-11-13 NOTE — Telephone Encounter (Signed)
Called patient. No answer.  Left message regarding increase in Synthroid.

## 2012-11-27 ENCOUNTER — Ambulatory Visit (INDEPENDENT_AMBULATORY_CARE_PROVIDER_SITE_OTHER): Payer: No Typology Code available for payment source | Admitting: *Deleted

## 2012-11-27 DIAGNOSIS — Z111 Encounter for screening for respiratory tuberculosis: Secondary | ICD-10-CM

## 2012-11-28 ENCOUNTER — Encounter: Payer: Self-pay | Admitting: Oncology

## 2012-11-28 NOTE — Progress Notes (Signed)
No show. POF to scheduler to reschedule. This encounter was created in error - please disregard.

## 2012-11-29 ENCOUNTER — Ambulatory Visit: Payer: Self-pay | Admitting: Oncology

## 2012-11-29 ENCOUNTER — Ambulatory Visit (INDEPENDENT_AMBULATORY_CARE_PROVIDER_SITE_OTHER): Payer: No Typology Code available for payment source | Admitting: *Deleted

## 2012-11-29 ENCOUNTER — Encounter: Payer: Self-pay | Admitting: Oncology

## 2012-11-29 ENCOUNTER — Other Ambulatory Visit: Payer: Self-pay | Admitting: Lab

## 2012-11-29 DIAGNOSIS — Z111 Encounter for screening for respiratory tuberculosis: Secondary | ICD-10-CM

## 2012-11-29 LAB — TB SKIN TEST: TB Skin Test: NEGATIVE

## 2012-11-30 ENCOUNTER — Telehealth: Payer: Self-pay | Admitting: Oncology

## 2012-11-30 NOTE — Telephone Encounter (Signed)
s/w pt and advised on 2.19.14 appt...pt ok and aware °

## 2012-12-06 ENCOUNTER — Other Ambulatory Visit: Payer: No Typology Code available for payment source | Admitting: Lab

## 2012-12-06 ENCOUNTER — Ambulatory Visit: Payer: No Typology Code available for payment source | Admitting: Oncology

## 2012-12-07 ENCOUNTER — Other Ambulatory Visit: Payer: Self-pay | Admitting: Oncology

## 2012-12-08 ENCOUNTER — Telehealth: Payer: Self-pay | Admitting: Oncology

## 2012-12-08 NOTE — Telephone Encounter (Signed)
per [place] 2.20.14 pof do not r/s pt until she calls [place] to schedule

## 2012-12-08 NOTE — Telephone Encounter (Signed)
returned pt called and lvm for pt regarding r/s appt on 2.27.14

## 2012-12-14 ENCOUNTER — Ambulatory Visit (HOSPITAL_BASED_OUTPATIENT_CLINIC_OR_DEPARTMENT_OTHER): Payer: No Typology Code available for payment source | Admitting: Oncology

## 2012-12-14 ENCOUNTER — Encounter: Payer: Self-pay | Admitting: Oncology

## 2012-12-14 ENCOUNTER — Other Ambulatory Visit (HOSPITAL_BASED_OUTPATIENT_CLINIC_OR_DEPARTMENT_OTHER): Payer: No Typology Code available for payment source | Admitting: Lab

## 2012-12-14 VITALS — BP 125/70 | HR 69 | Temp 98.2°F | Resp 20 | Ht 64.0 in | Wt 174.2 lb

## 2012-12-14 DIAGNOSIS — D509 Iron deficiency anemia, unspecified: Secondary | ICD-10-CM

## 2012-12-14 DIAGNOSIS — N920 Excessive and frequent menstruation with regular cycle: Secondary | ICD-10-CM

## 2012-12-14 LAB — CBC WITH DIFFERENTIAL/PLATELET
EOS%: 0.1 % (ref 0.0–7.0)
Eosinophils Absolute: 0 10*3/uL (ref 0.0–0.5)
LYMPH%: 45.7 % (ref 14.0–49.7)
MCH: 34.2 pg — ABNORMAL HIGH (ref 25.1–34.0)
MCV: 99.5 fL (ref 79.5–101.0)
MONO%: 17 % — ABNORMAL HIGH (ref 0.0–14.0)
Platelets: 237 10*3/uL (ref 145–400)
RBC: 3.21 10*6/uL — ABNORMAL LOW (ref 3.70–5.45)
RDW: 11.6 % (ref 11.2–14.5)

## 2012-12-14 LAB — COMPREHENSIVE METABOLIC PANEL (CC13)
AST: 18 U/L (ref 5–34)
Albumin: 3.4 g/dL — ABNORMAL LOW (ref 3.5–5.0)
Alkaline Phosphatase: 50 U/L (ref 40–150)
BUN: 10 mg/dL (ref 7.0–26.0)
Glucose: 80 mg/dl (ref 70–99)
Potassium: 3.9 mEq/L (ref 3.5–5.1)
Sodium: 141 mEq/L (ref 136–145)
Total Bilirubin: 0.51 mg/dL (ref 0.20–1.20)
Total Protein: 6.7 g/dL (ref 6.4–8.3)

## 2012-12-14 LAB — FERRITIN: Ferritin: 173 ng/mL (ref 10–291)

## 2012-12-14 LAB — IRON AND TIBC
TIBC: 199 ug/dL — ABNORMAL LOW (ref 250–470)
UIBC: 151 ug/dL (ref 125–400)

## 2012-12-14 NOTE — Patient Instructions (Addendum)
Your Hemoglobin is 11.0 today. We will wait for your iron studies to return to decide if you need more iron.  Plan to see you back in about 3 months.

## 2012-12-14 NOTE — Progress Notes (Signed)
Hematology and Oncology Follow Up Visit  Miranda Fowler [id number] November 14, 1980 32 y.o. 12/14/2012 2:46 PM  CC: Janelle Floor, DO    Principle Diagnosis:  A 32 year old female with iron-deficiency anemia diagnosed in August 2009 who presented with hemoglobin of 7.2, ferritin of 3.  Prior Therapy: Patient was treated with iron replacement on June 14, 2008, normalization of her iron study, also repeat IV iron infusion on 01/09/2010, 01/16/2010, 6/ 2012 and most recently on 11/2011, 05/2012, and 09/01/12.  Interim History: Miranda Fowler presents today for a followup visit.  She has been doing relatively well.  She does report some constitutional symptoms at times of fatigue and tiredness.  Does not report any chest pain or difficulty breathing.  She still is having rather heavy menstrual cycles at this time.   She is not reporting any chest pain.  She did not report any hematochezia.  She did not report any melena. She tolerated her last IV Fe replacement without complications.   Medications: I have reviewed the patient's current medications. Current outpatient prescriptions:levothyroxine (SYNTHROID, LEVOTHROID) 150 MCG tablet, Take 1 tablet (150 mcg total) by mouth daily., Disp: 30 tablet, Rfl: 3  Allergies: No Known Allergies  Past Medical History, Surgical history, Social history, and Family History were reviewed and updated.  Review of Systems: Constitutional:  Negative for fever, chills, night sweats, anorexia, weight loss, pain. Cardiovascular: no chest pain or dyspnea on exertion Respiratory: no cough, shortness of breath, or wheezing Neurological: no TIA or stroke symptoms Dermatological: negative ENT: negative Skin: Negative. Gastrointestinal: no abdominal pain, change in bowel habits, or black or bloody stools Genito-Urinary: no dysuria, trouble voiding, or hematuria Hematological and Lymphatic: negative Breast: negative Musculoskeletal: negative Remaining ROS  negative.  Physical Exam: Blood pressure 125/70, pulse 69, temperature 98.2 F (36.8 C), temperature source Oral, resp. rate 20, height 5\' 4"  (1.626 m), weight 174 lb 3.2 oz (79.017 kg). ECOG: 0 General appearance: alert Head: Normocephalic, without obvious abnormality, atraumatic Neck: no adenopathy, no carotid bruit, no JVD, supple, symmetrical, trachea midline and thyroid not enlarged, symmetric, no tenderness/mass/nodules Lymph nodes: Cervical, supraclavicular, and axillary nodes normal. Heart:regular rate and rhythm, S1, S2 normal, no murmur, click, rub or gallop Lung:chest clear, no wheezing, rales, normal symmetric air entry Abdomen: soft, non-tender, without masses or organomegaly EXT:no erythema, induration, or nodules   Lab Results: Lab Results  Component Value Date   WBC 3.7* 12/14/2012   HGB 11.0* 12/14/2012   HCT 31.9* 12/14/2012   MCV 99.5 12/14/2012   PLT 237 12/14/2012     Chemistry      Component Value Date/Time   NA 141 12/14/2012 1342   NA 140 02/25/2012 1149   K 3.9 12/14/2012 1342   K 4.0 02/25/2012 1149   CL 108* 12/14/2012 1342   CL 106 02/25/2012 1149   CO2 26 12/14/2012 1342   CO2 27 02/25/2012 1149   BUN 10.0 12/14/2012 1342   BUN 11 02/25/2012 1149   CREATININE 0.8 12/14/2012 1342   CREATININE 0.81 02/25/2012 1149   CREATININE 1.23* 07/20/2011 1121      Component Value Date/Time   CALCIUM 8.9 12/14/2012 1342   CALCIUM 9.0 02/25/2012 1149   ALKPHOS 50 12/14/2012 1342   ALKPHOS 46 02/25/2012 1149   AST 18 12/14/2012 1342   AST 18 02/25/2012 1149   ALT 11 12/14/2012 1342   ALT 8 02/25/2012 1149   BILITOT 0.51 12/14/2012 1342   BILITOT 0.6 02/25/2012 1149      Impression  and Plan:  A 32 year old female with the following issues:   1. Iron-deficiency anemia.  I am rechecking her ferritin level today and repeat it in 3 months. If ferritin is low, I will arrange for her to have IV Fe again. She cannot take oral iron due to GI symptoms.  2. Menorrhagia.  Continues  to be really the deriving factor and the sole factor where she is developing iron-deficiency anemia.  I have instructed her to follow up with her OB/GYN regarding that.    3. Follow-up. In 3 months.  [place], [place] 2/27/20142:46 PM

## 2012-12-15 ENCOUNTER — Telehealth: Payer: Self-pay | Admitting: Oncology

## 2012-12-15 NOTE — Telephone Encounter (Signed)
S/w the pt and she is aware of her may appts 

## 2013-01-23 ENCOUNTER — Other Ambulatory Visit: Payer: Self-pay | Admitting: Family Medicine

## 2013-01-23 ENCOUNTER — Ambulatory Visit (INDEPENDENT_AMBULATORY_CARE_PROVIDER_SITE_OTHER): Payer: No Typology Code available for payment source | Admitting: Family Medicine

## 2013-01-23 DIAGNOSIS — J069 Acute upper respiratory infection, unspecified: Secondary | ICD-10-CM

## 2013-01-23 DIAGNOSIS — E039 Hypothyroidism, unspecified: Secondary | ICD-10-CM

## 2013-01-23 DIAGNOSIS — E890 Postprocedural hypothyroidism: Secondary | ICD-10-CM

## 2013-01-23 LAB — TSH: TSH: 0.055 u[IU]/mL — ABNORMAL LOW (ref 0.350–4.500)

## 2013-01-23 MED ORDER — LEVOTHYROXINE SODIUM 125 MCG PO TABS: 125.0000 ug | ORAL_TABLET | Freq: Every day | ORAL | Status: DC

## 2013-01-23 MED ORDER — GUAIFENESIN ER 600 MG PO TB12: 600.0000 mg | ORAL_TABLET | Freq: Two times a day (BID) | ORAL | Status: DC

## 2013-01-23 MED ORDER — PSEUDOEPHEDRINE HCL 60 MG PO TABS: 60.0000 mg | ORAL_TABLET | Freq: Four times a day (QID) | ORAL | Status: DC | PRN

## 2013-01-23 NOTE — Assessment & Plan Note (Signed)
Uncontrolled. Will check TSH today and titrate Synthroid accordingly.

## 2013-01-23 NOTE — Progress Notes (Signed)
Subjective:     Patient ID: Miranda Fowler, female   DOB: 11-09-1980, 32 y.o.   MRN: [id number]  HPI 32 year old female presents for a same day visit with URI symptoms.  1) URI - Patient has had congestion, headache, and cough with green sputum production for 3 days. - No fever, chills although she has felt warm at times.  No SOB. - She works at a daycare and, thus, had numerous sick contacts. - She has not taken any medication for this but has increased her fluid intake and has been drinking lots of 100% Vit C Orange juice.  Review of Systems See HPI    Objective:   Physical Exam General: well appearing female in NAD. HEENT: NCAT.  TM's normal bilaterally.  No pharyngeal erythema or tonsillar exudate.  No sinus tenderness upon palpation. Heart: RRR. No murmurs, rubs, or gallops. Lungs: CTAB. No rales rhonchi, or wheeze.    Assessment:         Plan:

## 2013-01-23 NOTE — Assessment & Plan Note (Signed)
Rx given for Sudafed and mucinex. Patient encouraged to drink lots of fluids and to use PRN Tylenol for pain/fever. Instructed patient to return if she worsens.  Also educated about the likely persistent of cough for weeks.

## 2013-01-23 NOTE — Patient Instructions (Addendum)
It was good to see you today.  I will send a letter if your thyroid results are normal.  Otherwise I will give you a call.  Please take tylenol as needed for pain/fever.  I have prescribed sudafed for congestion and mucinex for your cough/sputum production.  Please return if your symptoms worsen:  Fever, chills, SOB, purulent nasal drainage.

## 2013-01-24 ENCOUNTER — Telehealth: Payer: Self-pay | Admitting: *Deleted

## 2013-01-24 NOTE — Telephone Encounter (Signed)
Pt informed of tsh level and that Dr Adriana Simas decrease synthroid to .Rx aldo sent to pharmacy.Patient voiced understanding. Sandar Krinke, Virgel Bouquet

## 2013-02-21 ENCOUNTER — Other Ambulatory Visit (HOSPITAL_COMMUNITY)
Admission: RE | Admit: 2013-02-21 | Discharge: 2013-02-21 | Disposition: A | Discharge status: Home / Self Care | Payer: No Typology Code available for payment source | Source: Ambulatory Visit | Attending: Family Medicine | Admitting: Family Medicine

## 2013-02-21 ENCOUNTER — Ambulatory Visit (INDEPENDENT_AMBULATORY_CARE_PROVIDER_SITE_OTHER): Payer: No Typology Code available for payment source | Admitting: Family Medicine

## 2013-02-21 ENCOUNTER — Encounter: Payer: Self-pay | Admitting: Family Medicine

## 2013-02-21 VITALS — BP 125/75 | HR 60 | Temp 98.7°F | Ht 64.0 in | Wt 176.0 lb

## 2013-02-21 DIAGNOSIS — B9689 Other specified bacterial agents as the cause of diseases classified elsewhere: Secondary | ICD-10-CM

## 2013-02-21 DIAGNOSIS — N760 Acute vaginitis: Secondary | ICD-10-CM

## 2013-02-21 DIAGNOSIS — A499 Bacterial infection, unspecified: Secondary | ICD-10-CM

## 2013-02-21 DIAGNOSIS — B373 Candidiasis of vulva and vagina: Secondary | ICD-10-CM

## 2013-02-21 DIAGNOSIS — Z113 Encounter for screening for infections with a predominantly sexual mode of transmission: Secondary | ICD-10-CM | POA: Insufficient documentation

## 2013-02-21 DIAGNOSIS — R300 Dysuria: Secondary | ICD-10-CM

## 2013-02-21 DIAGNOSIS — N390 Urinary tract infection, site not specified: Secondary | ICD-10-CM

## 2013-02-21 LAB — POCT URINALYSIS DIPSTICK
Bilirubin, UA: NEGATIVE
Glucose, UA: NEGATIVE
Nitrite, UA: NEGATIVE
Urobilinogen, UA: 1

## 2013-02-21 LAB — POCT WET PREP (WET MOUNT)

## 2013-02-21 MED ORDER — METRONIDAZOLE 500 MG PO TABS: 500.0000 mg | ORAL_TABLET | Freq: Two times a day (BID) | ORAL | Status: DC

## 2013-02-21 MED ORDER — FLUCONAZOLE 150 MG PO TABS: 150.0000 mg | ORAL_TABLET | Freq: Once | ORAL | Status: DC

## 2013-02-21 MED ORDER — CEPHALEXIN 500 MG PO CAPS: 500.0000 mg | ORAL_CAPSULE | Freq: Three times a day (TID) | ORAL | Status: DC

## 2013-02-21 NOTE — Patient Instructions (Signed)
It was good to meet you today! I have sent in a pill for you to take for a yeast infection. I am also sending a pill for a urinary tract infection. I am also sending a pill for bacterial vaginosis

## 2013-02-21 NOTE — Progress Notes (Signed)
Patient ID: Miranda Fowler, female   DOB: September 09, 1981, 32 y.o.   MRN: [id number] Subjective: The patient is a 32 y.o. year old female who presents today for the tinnitus.  Patient reports that about the last 3 days she's been having problems with whitish vaginal discharge, vaginal itching, tenderness, abdominal cramps, urinary frequency, and some dysuria. She denies any fevers, chills, back pain, nausea, vomiting, or diarrhea.  Patient's past medical, social, and family history were reviewed and updated as appropriate. History  Substance Use Topics  . Smoking status: Never Smoker   . Smokeless tobacco: Not on file  . Alcohol Use: No   Objective:  Filed Vitals:   02/21/13 1450  BP: 125/75  Pulse: 60  Temp: 98.7 F (37.1 C)   Gen: No acute distress Pelvic: Normal external genitalia. Vaginal mucosa is somewhat dry with a curd-like whitish discharge present. There is no significant abdominal pain.  Assessment/Plan: Vaginal yeast infection: Diflucan x1 UTI: Keflex x 1 week, culture urine BV: Flagyl  Please also see individual problems in problem list for problem-specific plans.

## 2013-02-23 ENCOUNTER — Encounter: Payer: Self-pay | Admitting: Family Medicine

## 2013-02-24 LAB — URINE CULTURE: Colony Count: 40000

## 2013-02-28 ENCOUNTER — Encounter: Payer: Self-pay | Admitting: Family Medicine

## 2013-02-28 ENCOUNTER — Ambulatory Visit (INDEPENDENT_AMBULATORY_CARE_PROVIDER_SITE_OTHER): Payer: No Typology Code available for payment source | Admitting: Family Medicine

## 2013-02-28 VITALS — BP 120/71 | HR 63 | Temp 98.8°F | Ht 64.0 in | Wt 177.0 lb

## 2013-02-28 DIAGNOSIS — N920 Excessive and frequent menstruation with regular cycle: Secondary | ICD-10-CM

## 2013-02-28 DIAGNOSIS — Z0000 Encounter for general adult medical examination without abnormal findings: Secondary | ICD-10-CM

## 2013-02-28 DIAGNOSIS — E890 Postprocedural hypothyroidism: Secondary | ICD-10-CM

## 2013-02-28 DIAGNOSIS — E039 Hypothyroidism, unspecified: Secondary | ICD-10-CM

## 2013-02-28 LAB — TSH: TSH: 40.755 u[IU]/mL — ABNORMAL HIGH (ref 0.350–4.500)

## 2013-02-28 NOTE — Progress Notes (Signed)
Subjective:     Patient ID: Miranda Fowler, female   DOB: 1981-07-30, 32 y.o.   MRN: [id number]  HPI 32 year old female with PMH of Synthroid presents for an annual physical exam.  1) Hypothyroidism - Patient reports that she is currently taking Synthroid 100 mcg daily. - No reported symptoms: weight gain, hot/cold intolerance, hair loss, constipation.  2) Menorrhagia - This a longstanding problem for the patient - She continues to have heavy bleeding.  - She is finally considering treatment options at this time as she is tired of the bleeding and associated iron deficiency anemia.   Review of Systems ROS: patient denies nausea, vomiting, SOB, chest pain.  She endorses Pica and recent dry mouth.    Objective:   Physical Exam  Constitutional: She appears well-developed and well-nourished. No distress.  HENT:  Head: Normocephalic and atraumatic.  Mouth/Throat: Oropharynx is clear and moist.  Cardiovascular: Normal rate and regular rhythm.   No murmur heard. Pulmonary/Chest: Breath sounds normal. She has no wheezes. She has no rales.  Abdominal: Soft. She exhibits no distension and no mass. There is no tenderness.  Lymphadenopathy:    She has no cervical adenopathy.  Skin: Skin is warm and dry. No rash noted.  Psychiatric: She has a normal mood and affect.      Assessment:       See Problem list Plan:

## 2013-02-28 NOTE — Assessment & Plan Note (Signed)
Tetanus up to date as is Pap smear. Will plan for clinical breast exam when patient comes in for pap.

## 2013-02-28 NOTE — Patient Instructions (Addendum)
It was nice seeing you today.  I will arrange referral to Ob-Gyn.  I will call you with the results of your labs.  Please follow up with me in 1 year or earlier as needed.

## 2013-02-28 NOTE — Assessment & Plan Note (Signed)
Checking TSH today. Will increase or decrease Synthroid accordingly.  Last TSH was suppressed at 0.055.

## 2013-02-28 NOTE — Assessment & Plan Note (Signed)
Discussed treatment options with patient (OCP's, IUD, Depo, Hysterectomy, Ablation, etc).  Patient opting for ablation vs. Hysterectomy.  Patient would like to be seen by Ob-Gyn to discuss options. Will place referral

## 2013-03-05 ENCOUNTER — Telehealth: Payer: Self-pay | Admitting: *Deleted

## 2013-03-05 ENCOUNTER — Other Ambulatory Visit: Payer: Self-pay | Admitting: Family Medicine

## 2013-03-05 NOTE — Telephone Encounter (Signed)
Rx for Synthroid 150 mcg one by mouth daily, disp 30 with no refills to [facility]  Per physician orders.  LMOVM instructing patient.  Melayna Robarts L

## 2013-03-15 ENCOUNTER — Telehealth: Payer: Self-pay | Admitting: Oncology

## 2013-03-15 ENCOUNTER — Other Ambulatory Visit: Payer: Self-pay | Admitting: Lab

## 2013-03-15 ENCOUNTER — Ambulatory Visit: Payer: Self-pay | Admitting: Oncology

## 2013-03-19 ENCOUNTER — Other Ambulatory Visit: Payer: Self-pay | Admitting: Family Medicine

## 2013-03-19 ENCOUNTER — Telehealth: Payer: Self-pay | Admitting: Family Medicine

## 2013-03-19 DIAGNOSIS — N920 Excessive and frequent menstruation with regular cycle: Secondary | ICD-10-CM

## 2013-03-19 NOTE — Telephone Encounter (Signed)
I don't see where these referrals have been ordered.  Patient has no insurance.  Can only go to the dental clinic if she has the orange card. If she is self pay she can call any dentist to make an appointment.  She does not need a referral.  Dr. Adriana Simas, please advise about [facility].  Again without insurance or the orange card, patient will be self pay.  Miranda Fowler

## 2013-03-19 NOTE — Telephone Encounter (Signed)
Patient is calling to check the status of the Dental and [facility] referrals.

## 2013-03-19 NOTE — Telephone Encounter (Signed)
Will ask Dr. Adriana Simas and Terese Door regarding these referrals.  Miranda Fowler, Miranda Fowler, CMA

## 2013-03-19 NOTE — Telephone Encounter (Signed)
Referral to Ob-Gyn placed.  Please advise patient to get set up for orange card.

## 2013-03-20 ENCOUNTER — Telehealth: Payer: Self-pay | Admitting: *Deleted

## 2013-03-20 ENCOUNTER — Telehealth: Payer: Self-pay | Admitting: Oncology

## 2013-03-20 ENCOUNTER — Other Ambulatory Visit (HOSPITAL_BASED_OUTPATIENT_CLINIC_OR_DEPARTMENT_OTHER): Payer: Self-pay | Admitting: Lab

## 2013-03-20 ENCOUNTER — Ambulatory Visit (HOSPITAL_BASED_OUTPATIENT_CLINIC_OR_DEPARTMENT_OTHER): Payer: Self-pay | Admitting: Oncology

## 2013-03-20 VITALS — BP 109/75 | HR 76 | Temp 98.6°F | Resp 18 | Ht 64.0 in | Wt 178.4 lb

## 2013-03-20 DIAGNOSIS — D509 Iron deficiency anemia, unspecified: Secondary | ICD-10-CM

## 2013-03-20 DIAGNOSIS — N920 Excessive and frequent menstruation with regular cycle: Secondary | ICD-10-CM

## 2013-03-20 LAB — CBC WITH DIFFERENTIAL/PLATELET
BASO%: 0.4 % (ref 0.0–2.0)
EOS%: 0.2 % (ref 0.0–7.0)
MCH: 32.1 pg (ref 25.1–34.0)
MCHC: 33.3 g/dL (ref 31.5–36.0)
MCV: 96.2 fL (ref 79.5–101.0)
MONO%: 11.2 % (ref 0.0–14.0)
RBC: 3.4 10*6/uL — ABNORMAL LOW (ref 3.70–5.45)
RDW: 12.2 % (ref 11.2–14.5)
lymph#: 2.2 10*3/uL (ref 0.9–3.3)

## 2013-03-20 LAB — IRON AND TIBC
%SAT: 29 % (ref 20–55)
Iron: 71 ug/dL (ref 42–145)

## 2013-03-20 LAB — FERRITIN: Ferritin: 98 ng/mL (ref 10–291)

## 2013-03-20 NOTE — Telephone Encounter (Signed)
Per staff message and POF I have scheduled appts.  [name]  

## 2013-03-20 NOTE — Telephone Encounter (Signed)
LMOVM for patient to return my call regarding note below.  Vishwa Dais, Darlyne Russian, CMA

## 2013-03-20 NOTE — Telephone Encounter (Signed)
Gave pt appt for 6/10 IV Iron and ML and lab for October 2014

## 2013-03-20 NOTE — Progress Notes (Signed)
Hematology and Oncology Follow Up Visit  Miranda Fowler [id number] Oct 04, 1981 32 y.o. 03/20/2013 3:42 PM  CC: Miranda Floor, DO    Principle Diagnosis:  A 32 year old female with iron-deficiency anemia diagnosed in August 2009 who presented with hemoglobin of 7.2, ferritin of 3.  Prior Therapy: Patient was treated with iron replacement on June 14, 2008, normalization of her iron study, also repeat IV iron infusion on 01/09/2010, 01/16/2010, 6/ 2012 and most recently on 11/2011, 05/2012, and 09/01/12.  Interim History: Miranda Fowler presents today for a followup visit.  She has been doing relatively well.  She does report symptoms of  fatigue and tiredness.  Does not report any chest pain or difficulty breathing.  She still is having rather heavy menstrual cycles at this time.   She is not reporting any chest pain.  She did not report any hematochezia.  She did not report any melena. She tolerated her last IV Fe replacement without complications. Her TSH was high and she is getting her thyroid medication adjusted.    Medications: I have reviewed the patient's current medications. Current outpatient prescriptions:levothyroxine (SYNTHROID, LEVOTHROID) 125 MCG tablet, Take 1 tablet (125 mcg total) by mouth daily., Disp: 30 tablet, Rfl: 3  Allergies: No Known Allergies  Past Medical History, Surgical history, Social history, and Family History were reviewed and updated.  Review of Systems: Constitutional:  Negative for fever, chills, night sweats, anorexia, weight loss, pain. Cardiovascular: no chest pain or dyspnea on exertion Respiratory: no cough, shortness of breath, or wheezing Neurological: no TIA or stroke symptoms Dermatological: negative ENT: negative Skin: Negative. Gastrointestinal: no abdominal pain, change in bowel habits, or black or bloody stools Genito-Urinary: no dysuria, trouble voiding, or hematuria Hematological and Lymphatic: negative Breast:  negative Musculoskeletal: negative Remaining ROS negative.  Physical Exam: Blood pressure 109/75, pulse 76, temperature 98.6 F (37 C), temperature source Oral, resp. rate 18, height 5\' 4"  (1.626 m), weight 178 lb 6.4 oz (80.922 kg). ECOG: 0 General appearance: alert Head: Normocephalic, without obvious abnormality, atraumatic Neck: no adenopathy, no carotid bruit, no JVD, supple, symmetrical, trachea midline and thyroid not enlarged, symmetric, no tenderness/mass/nodules Lymph nodes: Cervical, supraclavicular, and axillary nodes normal. Heart:regular rate and rhythm, S1, S2 normal, no murmur, click, rub or gallop Lung:chest clear, no wheezing, rales, normal symmetric air entry Abdomen: soft, non-tender, without masses or organomegaly EXT:no erythema, induration, or nodules   Lab Results: Lab Results  Component Value Date   WBC 4.6 03/20/2013   HGB 10.9* 03/20/2013   HCT 32.7* 03/20/2013   MCV 96.2 03/20/2013   PLT 251 03/20/2013     Chemistry      Component Value Date/Time   NA 141 12/14/2012 1342   NA 140 02/25/2012 1149   K 3.9 12/14/2012 1342   K 4.0 02/25/2012 1149   CL 108* 12/14/2012 1342   CL 106 02/25/2012 1149   CO2 26 12/14/2012 1342   CO2 27 02/25/2012 1149   BUN 10.0 12/14/2012 1342   BUN 11 02/25/2012 1149   CREATININE 0.8 12/14/2012 1342   CREATININE 0.81 02/25/2012 1149   CREATININE 1.23* 07/20/2011 1121      Component Value Date/Time   CALCIUM 8.9 12/14/2012 1342   CALCIUM 9.0 02/25/2012 1149   ALKPHOS 50 12/14/2012 1342   ALKPHOS 46 02/25/2012 1149   AST 18 12/14/2012 1342   AST 18 02/25/2012 1149   ALT 11 12/14/2012 1342   ALT 8 02/25/2012 1149   BILITOT 0.51 12/14/2012 1342  BILITOT 0.6 02/25/2012 1149      Impression and Plan:  A 32 year old female with the following issues:   1. Iron-deficiency anemia.  I am rechecking her ferritin level today and repeat it in 4 months. If ferritin is low, I will arrange for her to have IV Fe again. She cannot take oral iron due to  GI symptoms.  2. Menorrhagia.  Continues to be really the deriving factor and the sole factor where she is developing iron-deficiency anemia.  I have instructed her to follow up with her OB/GYN regarding that.    3. Follow-up. In 4 months.  [facility] 6/3/20143:42 PM

## 2013-03-27 ENCOUNTER — Ambulatory Visit (HOSPITAL_BASED_OUTPATIENT_CLINIC_OR_DEPARTMENT_OTHER): Payer: Self-pay

## 2013-03-27 VITALS — BP 138/83 | HR 65 | Temp 98.2°F

## 2013-03-27 DIAGNOSIS — D509 Iron deficiency anemia, unspecified: Secondary | ICD-10-CM

## 2013-03-27 MED ORDER — SODIUM CHLORIDE 0.9 % IV SOLN
1020.0000 mg | Freq: Once | INTRAVENOUS | Status: AC
  Administered 2013-03-27: 1020 mg via INTRAVENOUS
  Filled 2013-03-27: qty 34

## 2013-03-27 MED ORDER — SODIUM CHLORIDE 0.9 % IV SOLN
Freq: Once | INTRAVENOUS | Status: AC
  Administered 2013-03-27: 13:00:00 via INTRAVENOUS

## 2013-03-27 NOTE — Patient Instructions (Signed)
Ferumoxytol injection What is this medicine? FERUMOXYTOL is an iron complex. Iron is used to make healthy red blood cells, which carry oxygen and nutrients throughout the body. This medicine is used to treat iron deficiency anemia in people with chronic kidney disease. This medicine may be used for other purposes; ask your health care provider or pharmacist if you have questions. What should I tell my health care provider before I take this medicine? They need to know if you have any of these conditions: -anemia not caused by low iron levels -high levels of iron in the blood -magnetic resonance imaging (MRI) test scheduled -an unusual or allergic reaction to iron, other medicines, foods, dyes, or preservatives -pregnant or trying to get pregnant -breast-feeding How should I use this medicine? This medicine is for infusion into a vein. It is given by a health care professional in a hospital or clinic setting. Talk to your pediatrician regarding the use of this medicine in children. Special care may be needed. Overdosage: If you think you've taken too much of this medicine contact a poison control center or emergency room at once. Overdosage: If you think you have taken too much of this medicine contact a poison control center or emergency room at once. NOTE: This medicine is only for you. Do not share this medicine with others. What if I miss a dose? It is important not to miss your dose. Call your doctor or health care professional if you are unable to keep an appointment. What may interact with this medicine? This medicine may interact with the following medications: -other iron products This list may not describe all possible interactions. Give your health care provider a list of all the medicines, herbs, non-prescription drugs, or dietary supplements you use. Also tell them if you smoke, drink alcohol, or use illegal drugs. Some items may interact with your medicine. What should I watch  for while using this medicine? Visit your doctor or healthcare professional regularly. Tell your doctor or healthcare professional if your symptoms do not start to get better or if they get worse. You may need blood work done while you are taking this medicine. You may need to follow a special diet. Talk to your doctor. Foods that contain iron include: whole grains/cereals, dried fruits, beans, or peas, leafy green vegetables, and organ meats (liver, kidney). What side effects may I notice from receiving this medicine? Side effects that you should report to your doctor or health care professional as soon as possible: -allergic reactions like skin rash, itching or hives, swelling of the face, lips, or tongue -breathing problems -changes in blood pressure -feeling faint or lightheaded, falls -fever or chills -flushing, sweating, or hot feelings -swelling of the ankles or feet Side effects that usually do not require medical attention (Report these to your doctor or health care professional if they continue or are bothersome.): -diarrhea -headache -nausea, vomiting -stomach pain This list may not describe all possible side effects. Call your doctor for medical advice about side effects. You may report side effects to FDA at 1-800-FDA-1088. Where should I keep my medicine? This drug is given in a hospital or clinic and will not be stored at home. NOTE: This sheet is a summary. It may not cover all possible information. If you have questions about this medicine, talk to your doctor, pharmacist, or health care provider.  2012, [employer]vier/Gold Standard. (06/26/2008 9:48:25 PM) 

## 2013-03-28 ENCOUNTER — Encounter: Payer: Self-pay | Admitting: Obstetrics & Gynecology

## 2013-04-18 ENCOUNTER — Other Ambulatory Visit: Payer: Self-pay | Admitting: Family Medicine

## 2013-04-23 ENCOUNTER — Encounter: Payer: Self-pay | Admitting: Obstetrics & Gynecology

## 2013-06-02 ENCOUNTER — Other Ambulatory Visit: Payer: Self-pay | Admitting: Family Medicine

## 2013-07-20 ENCOUNTER — Ambulatory Visit: Payer: Self-pay | Admitting: Oncology

## 2013-07-20 ENCOUNTER — Other Ambulatory Visit: Payer: Self-pay | Admitting: Lab

## 2013-08-22 ENCOUNTER — Ambulatory Visit: Payer: Self-pay

## 2013-08-28 ENCOUNTER — Ambulatory Visit: Payer: No Typology Code available for payment source

## 2013-10-24 ENCOUNTER — Telehealth: Payer: Self-pay | Admitting: Oncology

## 2013-10-24 NOTE — Telephone Encounter (Signed)
pt called to r/s 10/3 appt. pt given new appt for 1/16 @ 1:15pm lb/KC.

## 2013-11-02 ENCOUNTER — Ambulatory Visit (HOSPITAL_BASED_OUTPATIENT_CLINIC_OR_DEPARTMENT_OTHER): Payer: No Typology Code available for payment source | Admitting: Oncology

## 2013-11-02 ENCOUNTER — Other Ambulatory Visit (HOSPITAL_BASED_OUTPATIENT_CLINIC_OR_DEPARTMENT_OTHER): Payer: No Typology Code available for payment source

## 2013-11-02 ENCOUNTER — Encounter: Payer: Self-pay | Admitting: Oncology

## 2013-11-02 VITALS — BP 120/72 | HR 65 | Temp 98.1°F | Resp 20 | Ht 64.0 in | Wt 159.0 lb

## 2013-11-02 DIAGNOSIS — D509 Iron deficiency anemia, unspecified: Secondary | ICD-10-CM

## 2013-11-02 DIAGNOSIS — N920 Excessive and frequent menstruation with regular cycle: Secondary | ICD-10-CM

## 2013-11-02 LAB — CBC WITH DIFFERENTIAL/PLATELET
BASO%: 0.8 % (ref 0.0–2.0)
Basophils Absolute: 0 10*3/uL (ref 0.0–0.1)
EOS ABS: 0 10*3/uL (ref 0.0–0.5)
EOS%: 0.2 % (ref 0.0–7.0)
HEMATOCRIT: 31.3 % — AB (ref 34.8–46.6)
HGB: 10.3 g/dL — ABNORMAL LOW (ref 11.6–15.9)
LYMPH#: 1.9 10*3/uL (ref 0.9–3.3)
LYMPH%: 44.6 % (ref 14.0–49.7)
MCH: 32.9 pg (ref 25.1–34.0)
MCHC: 33 g/dL (ref 31.5–36.0)
MCV: 99.7 fL (ref 79.5–101.0)
MONO#: 0.6 10*3/uL (ref 0.1–0.9)
MONO%: 13.5 % (ref 0.0–14.0)
NEUT%: 40.9 % (ref 38.4–76.8)
NEUTROS ABS: 1.7 10*3/uL (ref 1.5–6.5)
PLATELETS: 281 10*3/uL (ref 145–400)
RBC: 3.14 10*6/uL — AB (ref 3.70–5.45)
RDW: 11.9 % (ref 11.2–14.5)
WBC: 4.2 10*3/uL (ref 3.9–10.3)

## 2013-11-02 LAB — COMPREHENSIVE METABOLIC PANEL (CC13)
ALBUMIN: 3.8 g/dL (ref 3.5–5.0)
ALT: 8 U/L (ref 0–55)
ANION GAP: 7 meq/L (ref 3–11)
AST: 16 U/L (ref 5–34)
Alkaline Phosphatase: 47 U/L (ref 40–150)
BILIRUBIN TOTAL: 0.36 mg/dL (ref 0.20–1.20)
BUN: 9.1 mg/dL (ref 7.0–26.0)
CALCIUM: 8.9 mg/dL (ref 8.4–10.4)
CHLORIDE: 106 meq/L (ref 98–109)
CO2: 27 meq/L (ref 22–29)
Creatinine: 0.8 mg/dL (ref 0.6–1.1)
GLUCOSE: 97 mg/dL (ref 70–140)
Potassium: 3.8 mEq/L (ref 3.5–5.1)
SODIUM: 140 meq/L (ref 136–145)
TOTAL PROTEIN: 6.8 g/dL (ref 6.4–8.3)

## 2013-11-02 LAB — IRON AND TIBC CHCC
%SAT: 26 % (ref 21–57)
Iron: 53 ug/dL (ref 41–142)
TIBC: 205 ug/dL — ABNORMAL LOW (ref 236–444)
UIBC: 152 ug/dL (ref 120–384)

## 2013-11-02 LAB — FERRITIN CHCC: FERRITIN: 76 ng/mL (ref 9–269)

## 2013-11-02 NOTE — Progress Notes (Signed)
Hematology and Oncology Follow Up Visit  Miranda Fowler [id number] [birth year] 33 y.o. 11/02/2013 2:13 PM  CC: Rocky Crafts, DO    Principle Diagnosis:  A 33 year old female with iron-deficiency anemia diagnosed in August 2009 who presented with hemoglobin of 7.2, ferritin of 3.  Prior Therapy: Patient was treated with iron replacement on June 14, 2008, normalization of her iron study, also repeat IV iron infusion on 01/09/2010, 01/16/2010, 6/ 2012 and most recently on 11/2011, 05/2012, and 09/01/12.  Interim History: Miranda Fowler presents today for a followup visit.  She has been doing relatively well.  She does report symptoms of  fatigue and tiredness.  Does not report any chest pain or difficulty breathing.  She still is having rather heavy menstrual cycles at this time.   She is not reporting any chest pain.  She did not report any hematochezia.  She did not report any melena. She tolerated her last IV Fe replacement without complications.  She is scheduled to see GYN early next week to talk about possible hysterectomy.  Medications: I have reviewed the patient's current medications. Current outpatient prescriptions:levothyroxine (SYNTHROID, LEVOTHROID) 150 MCG tablet, Take 1 tablet (150 mcg total) by mouth daily., Disp: 30 tablet, Rfl: 6  Allergies: No Known Allergies  Past Medical History, Surgical history, Social history, and Family History were reviewed and updated.  Review of Systems: Constitutional:  Negative for fever, chills, night sweats, anorexia, weight loss, pain. Cardiovascular: no chest pain or dyspnea on exertion Respiratory: no cough, shortness of breath, or wheezing Neurological: no TIA or stroke symptoms Dermatological: negative ENT: negative Skin: Negative. Gastrointestinal: no abdominal pain, change in bowel habits, or black or bloody stools Genito-Urinary: no dysuria, trouble voiding, or hematuria Hematological and Lymphatic: negative Breast:  negative Musculoskeletal: negative Remaining ROS negative.  Physical Exam: Blood pressure 120/72, pulse 65, temperature 98.1 F (36.7 C), temperature source Oral, resp. rate 20, height 5\' 4"  (1.626 m), weight 159 lb (72.122 kg), SpO2 98.00%. ECOG: 0 General appearance: alert Head: Normocephalic, without obvious abnormality, atraumatic Neck: no adenopathy, no carotid bruit, no JVD, supple, symmetrical, trachea midline and thyroid not enlarged, symmetric, no tenderness/mass/nodules Lymph nodes: Cervical, supraclavicular, and axillary nodes normal. Heart:regular rate and rhythm, S1, S2 normal, no murmur, click, rub or gallop Lung:chest clear, no wheezing, rales, normal symmetric air entry Abdomen: soft, non-tender, without masses or organomegaly EXT:no erythema, induration, or nodules   Lab Results: Lab Results  Component Value Date   WBC 4.2 11/02/2013   HGB 10.3* 11/02/2013   HCT 31.3* 11/02/2013   MCV 99.7 11/02/2013   PLT 281 11/02/2013     Chemistry      Component Value Date/Time   NA 140 11/02/2013 1336   NA 140 02/25/2012 1149   K 3.8 11/02/2013 1336   K 4.0 02/25/2012 1149   CL 108* 12/14/2012 1342   CL 106 02/25/2012 1149   CO2 27 11/02/2013 1336   CO2 27 02/25/2012 1149   BUN 9.1 11/02/2013 1336   BUN 11 02/25/2012 1149   CREATININE 0.8 11/02/2013 1336   CREATININE 0.81 02/25/2012 1149   CREATININE 1.23* 07/20/2011 1121      Component Value Date/Time   CALCIUM 8.9 11/02/2013 1336   CALCIUM 9.0 02/25/2012 1149   ALKPHOS 47 11/02/2013 1336   ALKPHOS 46 02/25/2012 1149   AST 16 11/02/2013 1336   AST 18 02/25/2012 1149   ALT 8 11/02/2013 1336   ALT 8 02/25/2012 1149   BILITOT 0.36 11/02/2013 1336  BILITOT 0.6 02/25/2012 1149      Impression and Plan:  A 33 year old female with the following issues:   1. Iron-deficiency anemia.  I am rechecking her ferritin level today and repeat it in 4 months. If ferritin is low, I will arrange for her to have IV Fe again. She cannot take oral  iron due to GI symptoms.  2. Menorrhagia.  Continues to be really the deriving factor and the sole factor where she is developing iron-deficiency anemia.  I have instructed her to follow up with her OB/GYN regarding that.    3. Follow-up. In 4 months.  Xcaret Morad 1/16/20152:13 PM

## 2013-11-04 ENCOUNTER — Telehealth: Payer: Self-pay | Admitting: Oncology

## 2013-11-05 ENCOUNTER — Encounter: Payer: Self-pay | Admitting: Family Medicine

## 2013-11-05 ENCOUNTER — Ambulatory Visit (INDEPENDENT_AMBULATORY_CARE_PROVIDER_SITE_OTHER): Payer: No Typology Code available for payment source | Admitting: Family Medicine

## 2013-11-05 VITALS — BP 126/80 | HR 67 | Temp 98.0°F | Ht 64.0 in | Wt 154.0 lb

## 2013-11-05 DIAGNOSIS — E890 Postprocedural hypothyroidism: Secondary | ICD-10-CM

## 2013-11-05 DIAGNOSIS — Z113 Encounter for screening for infections with a predominantly sexual mode of transmission: Secondary | ICD-10-CM | POA: Insufficient documentation

## 2013-11-05 LAB — TSH: TSH: 9.481 u[IU]/mL — ABNORMAL HIGH (ref 0.350–4.500)

## 2013-11-05 NOTE — Patient Instructions (Signed)
It was great to meet you today!  Dr.Cook will follow up about your thyroid levels.

## 2013-11-05 NOTE — Assessment & Plan Note (Signed)
Drew HIV and RPR per patient request She plans to followup with her PCP for complete evaluation including pelvic exam.

## 2013-11-05 NOTE — Progress Notes (Signed)
Patient ID: Miranda Fowler, female   DOB: Feb 25, 1981, 33 y.o.   MRN: [id number]  Kenn File, MD Phone: [phone]  Subjective:  Chief complaint-noted  # SDA for thyroid check  Patient states that she status post thyroid ablation at approximately age 69. She reports that she hasn't been to the doctor long time and is worried about her thyroid. She endorses cold intolerance and difficulty sleeping. She denies any hair or near each nail changes, mood changes, or weight gain.  She also continues to have heavy vaginal bleeding. She states that she has not been able to see an OB/GYN do to them not extending the orange card. She states she bleeds for approximately 5 days and uses greater than 20 pads per day on those days. She also notes that she has intermittent spotting throughout the month.   ROS- No fever, chills, sweats No dyspnea No chest pain No abnormal pain  Past Medical History Patient Active Problem List   Diagnosis Date Noted  . Screen for STD (sexually transmitted disease) 11/05/2013  . Preventative health care 02/28/2013  . HYPOTHYROIDISM, POST-RADIATION 04/06/2010  . ANEMIA, IRON DEFICIENCY 04/06/2010  . MENORRHAGIA 04/06/2010    Medications- reviewed and updated Current Outpatient Prescriptions  Medication Sig Dispense Refill  . levothyroxine (SYNTHROID, LEVOTHROID) 150 MCG tablet Take 1 tablet (150 mcg total) by mouth daily.  30 tablet  6   No current facility-administered medications for this visit.    Objective: BP 126/80  Pulse 67  Temp(Src) 98 F (36.7 C) (Oral)  Ht 5\' 4"  (1.626 m)  Wt 154 lb (69.854 kg)  BMI 26.42 kg/m2  LMP 10/24/2013 Gen: NAD, alert, cooperative with exam HEENT: NCAT, no thyromegaly, mmm CV: RRR, good S1/S2, no murmur Resp: CTABL, no wheezes, non-labored Abd: SNTND, BS present, no guarding or organomegaly Skin: No hair or nail abnormalities   Assessment/Plan:  Screen for STD (sexually transmitted disease) Drew HIV and  RPR per patient request She plans to followup with her PCP for complete evaluation including pelvic exam.  HYPOTHYROIDISM, POST-RADIATION Checking TSH today, she is status post ablation  Will defer Synthroid management to her PCP and who she should see in the next few days.    Orders Placed This Encounter  Procedures  . TSH  . HIV antibody  . RPR    No orders of the defined types were placed in this encounter.

## 2013-11-05 NOTE — Assessment & Plan Note (Signed)
Checking TSH today, she is status post ablation  Will defer Synthroid management to her PCP and who she should see in the next few days.

## 2013-11-06 LAB — HIV ANTIBODY (ROUTINE TESTING W REFLEX): HIV: NONREACTIVE

## 2013-11-06 LAB — RPR

## 2013-11-13 ENCOUNTER — Encounter: Payer: Self-pay | Admitting: Family Medicine

## 2013-11-13 ENCOUNTER — Ambulatory Visit (INDEPENDENT_AMBULATORY_CARE_PROVIDER_SITE_OTHER): Payer: No Typology Code available for payment source | Admitting: Family Medicine

## 2013-11-13 VITALS — BP 127/75 | HR 65 | Temp 98.2°F | Ht 64.0 in | Wt 158.7 lb

## 2013-11-13 DIAGNOSIS — E890 Postprocedural hypothyroidism: Secondary | ICD-10-CM

## 2013-11-13 DIAGNOSIS — N920 Excessive and frequent menstruation with regular cycle: Secondary | ICD-10-CM

## 2013-11-13 DIAGNOSIS — D509 Iron deficiency anemia, unspecified: Secondary | ICD-10-CM

## 2013-11-13 MED ORDER — LEVOTHYROXINE SODIUM 200 MCG PO TABS: 200.0000 ug | ORAL_TABLET | Freq: Every day | ORAL | Status: DC

## 2013-11-13 NOTE — Assessment & Plan Note (Signed)
Stable, followed by heme/onc.  Etiology-menorrhagia.

## 2013-11-13 NOTE — Assessment & Plan Note (Signed)
TSH elevated at 9.481. Increasing Synthroid to 200 MCG daily

## 2013-11-13 NOTE — Assessment & Plan Note (Signed)
Once again discussed options today (IUD, endometrial ablation, hysterectomy, OCPs, Depo). Unclear about how she would like to proceed.  Encouraged her to see [facility] if she desires endometrial ablation or hysterectomy.

## 2013-11-13 NOTE — Progress Notes (Signed)
   Subjective:    Patient ID: Miranda Fowler, female    DOB: 1981-01-18, 33 y.o.   MRN: [id number]  HPI 33 year old female presents today for follow up.  1) Hypothyroidism - Patient recently seen and had elevated TSH (this has been a long-standing issue for her). - Continues to endorse fatigue. - No recent weight gain, hair loss, cold intolerance. - Endorses compliance with synthroid.  2) Menorrhagia and Iron deficiency anemia - Patient reports that this is stable. - However, she continues to have heavy periods with passing of large clots - We have discussed this several times in the past, including treatment options. - Patient has not had her menorrhagia addressed as she is unclear how she wants to proceed with treatment. - Iron deficiency stable she continues to receive her regular IV iron infusions.   Review of Systems Per HPI    Objective:   Physical Exam Filed Vitals:   11/13/13 1353  BP: 127/75  Pulse: 65  Temp: 98.2 F (36.8 C)   Exam: General: well appearing young female in no acute distress. Cardiovascular: RRR. No murmurs, rubs, or gallops. Respiratory: CTAB. No rales, rhonchi, or wheeze. Abdomen: soft, nontender, nondistended.    Assessment & Plan:  See problem list

## 2014-03-08 ENCOUNTER — Ambulatory Visit: Payer: Self-pay | Admitting: Oncology

## 2014-03-08 ENCOUNTER — Other Ambulatory Visit: Payer: Self-pay

## 2014-04-01 ENCOUNTER — Ambulatory Visit (INDEPENDENT_AMBULATORY_CARE_PROVIDER_SITE_OTHER): Payer: No Typology Code available for payment source | Admitting: Sports Medicine

## 2014-04-01 ENCOUNTER — Encounter: Payer: Self-pay | Admitting: Sports Medicine

## 2014-04-01 VITALS — BP 117/78 | HR 66 | Temp 98.2°F | Ht 64.0 in | Wt 159.4 lb

## 2014-04-01 DIAGNOSIS — M25511 Pain in right shoulder: Secondary | ICD-10-CM | POA: Insufficient documentation

## 2014-04-01 DIAGNOSIS — M25519 Pain in unspecified shoulder: Secondary | ICD-10-CM

## 2014-04-01 MED ORDER — MELOXICAM 15 MG PO TABS: 15.0000 mg | ORAL_TABLET | Freq: Every day | ORAL | Status: DC

## 2014-04-01 NOTE — Assessment & Plan Note (Signed)
Subacute condition  - likely reflective of subscapularis strain/chronic tendinosis on clinical exam 1. Trial of Mobic x2 weeks then when necessary 2. Both internal and external rotation isometric strengthening exercises given > If not improved in 2 weeks recommend advanced imaging with either [facility] ultrasound or MRI.

## 2014-04-01 NOTE — Progress Notes (Signed)
  Miranda Fowler - 33 y.o. female MRN [id number]  Date of birth: March 30, 1981  SUBJECTIVE:  Including CC & ROS.  Chief Complaint  Patient presents with  . Shoulder Pain   two-month plus history of right anterior shoulder pain worsened with activity.  Ibuprofen/Motrin has been ineffective.  No additional treatments.  No numbness, tingling, weakness.  No changes in activity.  Has been stable at job at daycare.   HISTORY: Past Medical, Surgical, Social, and Family History Reviewed & Updated per EMR. Pertinent Historical Findings include: Hypothyroidism status post radiation, menorrhagia, iron deficiency anemia,  PHYSICAL EXAM:  VS: BP:117/78 mmHg  HR:66bpm  TEMP:98.2 F (36.8 C)(Oral)  RESP:   HT:5\' 4"  (162.6 cm)   WT:159 lb 6.4 oz (72.303 kg)  BMI:27.4 PHYSICAL EXAM: GENERAL:  Adult African American female. In no discomfort; no respiratory distress  PSYCH:  alert and appropriate, good insight  Right shoulder Exam: Gen:  Normal-appearing Palp:  No marked deformity, minimal tenderness to palpation over bicipital tendon. ROM:  Full active range of motion of bilateral shoulders, slight scapular dyskinesia on right NV:  The bilateral upper extremity myotomes are 5+/5.  Sensation is grossly intact to light sensation in bilateral upper extremity dermatomes. Tests:  Negative empty can, Hawkins, Neer's, Yergason's, Painful resisted internal rotation  ASSESSMENT & PLAN: See problem based charting & AVS for pt instructions.

## 2014-04-01 NOTE — Patient Instructions (Signed)
Start taking meloxicam daily for the next 14 days then as needed.  Remember to do the strengthening exercises against the door frame both inwards and outwards 3-4 times per day.  Do 5-6 of these exercises in each direction limiting yourself to pain level of 2-3.  If not improved in 2 weeks please return for further evaluation

## 2014-04-24 ENCOUNTER — Encounter: Payer: No Typology Code available for payment source | Admitting: Family Medicine

## 2014-04-25 ENCOUNTER — Encounter: Payer: Self-pay | Admitting: Family Medicine

## 2014-04-25 ENCOUNTER — Ambulatory Visit (INDEPENDENT_AMBULATORY_CARE_PROVIDER_SITE_OTHER): Payer: No Typology Code available for payment source | Admitting: Family Medicine

## 2014-04-25 VITALS — BP 107/49 | HR 69 | Temp 99.3°F | Ht 64.0 in | Wt 162.0 lb

## 2014-04-25 DIAGNOSIS — E890 Postprocedural hypothyroidism: Secondary | ICD-10-CM

## 2014-04-25 DIAGNOSIS — Z0000 Encounter for general adult medical examination without abnormal findings: Secondary | ICD-10-CM

## 2014-04-25 NOTE — Progress Notes (Signed)
   Subjective:    Patient ID: Miranda Fowler, female    DOB: 1981/06/09, 33 y.o.   MRN: [id number]  HPI Concerns: Chronic right shoulder pain and hypothyroidism s/p radiation.  Past Medical History  Diagnosis Date  . Hypothyroidism     post-radiation at age 72  . Anemia     Chronic problem for patient.  Followed by Heme, receives IV iron transfusions secondary to inability to tolerate PO supplements.     Past Surgical History  Procedure Laterality Date  . Bilateral salpingectomy Bilateral 2008   History reviewed. No pertinent family history.  History   Social History  . Marital Status: Single    Spouse Name: N/A    Number of Children: N/A  . Years of Education: N/A   Social History Main Topics  . Smoking status: Never Smoker   . Smokeless tobacco: None  . Alcohol Use: No  . Drug Use: No  . Sexual Activity: Yes    Birth Control/ Protection: Surgical   Other Topics Concern  . None   Social History Narrative  . None    No Known Allergies   Review of Systems  Musculoskeletal: Positive for arthralgias (Right shoulder).  All other systems reviewed and are negative.      Objective:   Physical Exam  Constitutional: She is oriented to person, place, and time. She appears well-developed and well-nourished.  HENT:  Right Ear: Tympanic membrane normal.  Left Ear: Tympanic membrane normal.  Mouth/Throat: Uvula is midline, oropharynx is clear and moist and mucous membranes are normal.  Eyes: Conjunctivae and EOM are normal. Pupils are equal, round, and reactive to light.  Neck: Normal range of motion.  Cardiovascular: Normal rate, regular rhythm and normal heart sounds.   No murmur heard. Pulmonary/Chest: Effort normal and breath sounds normal. No respiratory distress. She has no wheezes. She has no rales.  Abdominal: Soft. Bowel sounds are normal. She exhibits no distension. There is no tenderness. There is no rebound.  Musculoskeletal: Normal range of motion.  She exhibits no edema and no tenderness.  Neurological: She is alert and oriented to person, place, and time. No cranial nerve deficit. Coordination normal.  Skin: Skin is warm and dry.  Psychiatric: She has a normal mood and affect.        Assessment & Plan:   Well adult exam. Hypothyroidism.  Will obtain TSH since last TSH high. Patient compliant with levothyroxine. To follow-up with PCP.

## 2014-04-25 NOTE — Patient Instructions (Signed)
Miranda Fowler, it was a pleasure seeing you today. Today you had a physical. Everything looked good on your examination.   Please schedule an appointment to see your PCP, Dr. Lacinda Axon in 4 weeks, or sooner, for your shoulder and to follow-up on your hypothyroidism.  If you have any questions or concerns, please do not hesitate to call the office at [phone].  Sincerely,  Cordelia Poche, MD

## 2014-04-26 LAB — TSH: TSH: 0.995 u[IU]/mL (ref 0.350–4.500)

## 2014-04-30 ENCOUNTER — Telehealth: Payer: Self-pay | Admitting: *Deleted

## 2014-04-30 NOTE — Telephone Encounter (Signed)
Message copied by Johny Shears on Tue Apr 30, 2014  9:41 AM ------      Message from: Cordelia Poche A      Created: Mon Apr 29, 2014  5:24 PM       Please inform patient that TSH is normal and to please continue taking levothyroxine as prescribed. ------

## 2014-04-30 NOTE — Telephone Encounter (Signed)
Spoke with patient and informed her of below 

## 2014-12-06 ENCOUNTER — Other Ambulatory Visit: Payer: Self-pay | Admitting: Family Medicine

## 2015-04-06 ENCOUNTER — Emergency Department (INDEPENDENT_AMBULATORY_CARE_PROVIDER_SITE_OTHER)
Admission: EM | Admit: 2015-04-06 | Discharge: 2015-04-06 | Disposition: A | Discharge status: Home / Self Care | Payer: Self-pay | Source: Home / Self Care | Attending: Emergency Medicine | Admitting: Emergency Medicine

## 2015-04-06 ENCOUNTER — Encounter (HOSPITAL_COMMUNITY): Payer: Self-pay

## 2015-04-06 DIAGNOSIS — E038 Other specified hypothyroidism: Secondary | ICD-10-CM

## 2015-04-06 DIAGNOSIS — H1131 Conjunctival hemorrhage, right eye: Secondary | ICD-10-CM

## 2015-04-06 DIAGNOSIS — E034 Atrophy of thyroid (acquired): Secondary | ICD-10-CM

## 2015-04-06 LAB — TSH: TSH: 87 u[IU]/mL — ABNORMAL HIGH (ref 0.350–4.500)

## 2015-04-06 MED ORDER — LEVOTHYROXINE SODIUM 100 MCG PO TABS: 100.0000 ug | ORAL_TABLET | Freq: Every day | ORAL | Status: DC

## 2015-04-06 NOTE — ED Notes (Signed)
Dr Bridgett Larsson aware of report for thyroid levels.

## 2015-04-06 NOTE — Discharge Instructions (Signed)
A blood vessel in your eye burst causing the redness. This will resolve on its own over the next week or so. You can use over-the-counter Visine-A to see if that will help with the tearing and swelling.  We drew a thyroid level today. I have restarted her thyroid medication at a lower dose. Please take 100 mcg daily. This will need to be monitored.  You can call the family practice center and asked to speak to Horald Pollen about getting the orange card. If they are unable to assist you, you can call our office and asked to speak to [facility].  Follow-up as needed.

## 2015-04-06 NOTE — ED Notes (Signed)
Needs thyroid Rx . Also concerned about eye redness

## 2015-04-06 NOTE — ED Provider Notes (Addendum)
CSN: [id number]     Arrival date & time 04/06/15  1506 History   First MD Initiated Contact with Patient 04/06/15 1555     No chief complaint on file. CC: eye redness; med refill  (Consider location/radiation/quality/duration/timing/severity/associated sxs/prior Treatment) HPI  She is a 34 year old woman here for evaluation of right eye redness. She states her right eye has been tearing more than usual and will intermittently get swollen for the last several weeks. Last night she noticed a small spot of red, and this morning it is larger. She denies any injury or trauma to the eye. She has not tried any eyedrops. No blurred vision or double vision. She does report seeing a glare in the right eye.  She also reports that she has been out of her thyroid medication for the last 4 months. This is due to losing her insurance.  Past Medical History  Diagnosis Date  . Hypothyroidism     post-radiation at age 50  . Anemia     Chronic problem for patient.  Followed by Heme, receives IV iron transfusions secondary to inability to tolerate PO supplements.     Past Surgical History  Procedure Laterality Date  . Bilateral salpingectomy Bilateral 2008   No family history on file. History  Substance Use Topics  . Smoking status: Never Smoker   . Smokeless tobacco: Not on file  . Alcohol Use: No   OB History    No data available     Review of Systems As in history of present illness Allergies  Review of patient's allergies indicates no known allergies.  Home Medications   Prior to Admission medications   Medication Sig Start Date End Date Taking? Authorizing Provider  levothyroxine (SYNTHROID, LEVOTHROID) 100 MCG tablet Take 1 tablet (100 mcg total) by mouth daily before breakfast. 04/06/15   Melony Overly, MD  meloxicam (MOBIC) 15 MG tablet Take 1 tablet (15 mg total) by mouth daily. Take 1 pill daily X 14 days then as needed 04/01/14   Gerda Diss, DO   BP 110/75 mmHg  Pulse 61   Temp(Src) 98.4 F (36.9 C) (Oral)  Resp 18  SpO2 100% Physical Exam  Constitutional: She is oriented to person, place, and time. She appears well-developed and well-nourished. No distress.  Eyes: EOM are normal. Pupils are equal, round, and reactive to light.  Right eye with a sub-conjunctival hemorrhage in the medial aspect. There is increased tearing in the right eye. No swelling.  Neck: Neck supple.  Cardiovascular: Normal rate.   Pulmonary/Chest: Effort normal.  Neurological: She is alert and oriented to person, place, and time.    ED Course  Procedures (including critical care time) Labs Review Labs Reviewed  TSH    Imaging Review No results found.   MDM   1. Subconjunctival hemorrhage of right eye   2. Hypothyroidism due to acquired atrophy of thyroid    Reassurance given about the subconjunctival hemorrhage. Recommended use of over-the-counter allergy eyedrop to see if that helps the tearing. We have drawn a TSH here. I have restarted her Synthroid at 100 mcg. Instructed her to call the family practice center and speak to Horald Pollen about getting the orange card. She will need a follow-up thyroid level in 6 weeks.    Melony Overly, MD 04/06/15 Rosa Sanchez Melesio Madara, MD 04/06/15 [phone]

## 2015-04-17 ENCOUNTER — Ambulatory Visit: Payer: Self-pay

## 2015-08-06 ENCOUNTER — Emergency Department (INDEPENDENT_AMBULATORY_CARE_PROVIDER_SITE_OTHER)
Admission: EM | Admit: 2015-08-06 | Discharge: 2015-08-06 | Disposition: A | Discharge status: Home / Self Care | Payer: Self-pay | Source: Home / Self Care | Attending: Family Medicine | Admitting: Family Medicine

## 2015-08-06 ENCOUNTER — Encounter (HOSPITAL_COMMUNITY): Payer: Self-pay | Admitting: *Deleted

## 2015-08-06 DIAGNOSIS — J45909 Unspecified asthma, uncomplicated: Secondary | ICD-10-CM

## 2015-08-06 LAB — TSH: TSH: 88 u[IU]/mL — AB (ref 0.350–4.500)

## 2015-08-06 MED ORDER — METHYLPREDNISOLONE SODIUM SUCC 125 MG IJ SOLR
125.0000 mg | Freq: Once | INTRAMUSCULAR | Status: AC
  Administered 2015-08-06: 125 mg via INTRAMUSCULAR

## 2015-08-06 MED ORDER — ALBUTEROL SULFATE (2.5 MG/3ML) 0.083% IN NEBU
INHALATION_SOLUTION | RESPIRATORY_TRACT | Status: AC
  Filled 2015-08-06: qty 6

## 2015-08-06 MED ORDER — IPRATROPIUM BROMIDE 0.02 % IN SOLN
RESPIRATORY_TRACT | Status: AC
  Filled 2015-08-06: qty 2.5

## 2015-08-06 MED ORDER — IPRATROPIUM BROMIDE 0.06 % NA SOLN: 2.0000 | Freq: Four times a day (QID) | NASAL | Status: DC

## 2015-08-06 MED ORDER — AZITHROMYCIN 250 MG PO TABS: ORAL_TABLET | ORAL | Status: DC

## 2015-08-06 MED ORDER — IPRATROPIUM BROMIDE 0.02 % IN SOLN
0.5000 mg | Freq: Once | RESPIRATORY_TRACT | Status: AC
  Administered 2015-08-06: 0.5 mg via RESPIRATORY_TRACT

## 2015-08-06 MED ORDER — ALBUTEROL SULFATE (2.5 MG/3ML) 0.083% IN NEBU
5.0000 mg | INHALATION_SOLUTION | Freq: Once | RESPIRATORY_TRACT | Status: AC
  Administered 2015-08-06: 5 mg via RESPIRATORY_TRACT

## 2015-08-06 MED ORDER — METHYLPREDNISOLONE SODIUM SUCC 125 MG IJ SOLR
INTRAMUSCULAR | Status: AC
  Filled 2015-08-06: qty 2

## 2015-08-06 NOTE — ED Provider Notes (Signed)
CSN: [id number]     Arrival date & time 08/06/15  1821 History   First MD Initiated Contact with Patient 08/06/15 1902     Chief Complaint  Patient presents with  . Cough   (Consider location/radiation/quality/duration/timing/severity/associated sxs/prior Treatment) Patient is a 34 y.o. female presenting with cough. The history is provided by the patient.  Cough Cough characteristics:  Non-productive, harsh and hacking Severity:  Moderate Onset quality:  Gradual Duration:  3 weeks Progression:  Unchanged Chronicity:  New Smoker: no   Context: sick contacts, upper respiratory infection and weather changes   Relieved by:  Nothing Associated symptoms: rhinorrhea, sinus congestion and wheezing   Associated symptoms: no chest pain, no chills and no fever     Past Medical History  Diagnosis Date  . Hypothyroidism     post-radiation at age 40  . Anemia     Chronic problem for patient.  Followed by Heme, receives IV iron transfusions secondary to inability to tolerate PO supplements.     Past Surgical History  Procedure Laterality Date  . Bilateral salpingectomy Bilateral 2008   History reviewed. No pertinent family history. Social History  Substance Use Topics  . Smoking status: Never Smoker   . Smokeless tobacco: None  . Alcohol Use: No   OB History    No data available     Review of Systems  Constitutional: Negative for fever and chills.  HENT: Positive for congestion, postnasal drip and rhinorrhea.   Respiratory: Positive for cough and wheezing.   Cardiovascular: Negative for chest pain.  All other systems reviewed and are negative.   Allergies  Review of patient's allergies indicates no known allergies.  Home Medications   Prior to Admission medications   Medication Sig Start Date End Date Taking? Authorizing Provider  azithromycin (ZITHROMAX Z-PAK) 250 MG tablet Take as directed on pack 08/06/15   Billy Fischer, MD  ipratropium (ATROVENT) 0.06 % nasal spray  Place 2 sprays into both nostrils 4 (four) times daily. 08/06/15   Billy Fischer, MD  levothyroxine (SYNTHROID, LEVOTHROID) 100 MCG tablet Take 1 tablet (100 mcg total) by mouth daily before breakfast. 04/06/15   Melony Overly, MD  meloxicam (MOBIC) 15 MG tablet Take 1 tablet (15 mg total) by mouth daily. Take 1 pill daily X 14 days then as needed 04/01/14   Gerda Diss, MD   Meds Ordered and Administered this Visit   Medications  methylPREDNISolone sodium succinate (SOLU-MEDROL) 125 mg/2 mL injection 125 mg (not administered)  ipratropium (ATROVENT) nebulizer solution 0.5 mg (not administered)  albuterol (PROVENTIL) (2.5 MG/3ML) 0.083% nebulizer solution 5 mg (not administered)    BP 111/76 mmHg  Pulse 78  Temp(Src) 99.3 F (37.4 C) (Oral)  Resp 78  SpO2 98%  LMP 07/26/2015 No data found.   Physical Exam  Constitutional: She is oriented to person, place, and time. She appears well-developed and well-nourished. No distress.  HENT:  Head: Normocephalic.  Right Ear: External ear normal.  Left Ear: External ear normal.  Nose: Mucosal edema and rhinorrhea present.  Mouth/Throat: Oropharynx is clear and moist.  Neck: Normal range of motion. Neck supple.  Cardiovascular: Normal rate and normal heart sounds.   Pulmonary/Chest: Effort normal. She has wheezes.  Abdominal: Soft.  Lymphadenopathy:    She has no cervical adenopathy.  Neurological: She is alert and oriented to person, place, and time.  Skin: Skin is warm and dry.  Nursing note and vitals reviewed.   ED Course  Procedures (  including critical care time)  Labs Review Labs Reviewed  TSH    Imaging Review No results found.   Visual Acuity Review  Right Eye Distance:   Left Eye Distance:   Bilateral Distance:    Right Eye Near:   Left Eye Near:    Bilateral Near:         MDM   1. Asthma due to environmental allergies    Sx improved after neb.meds    Billy Fischer, MD 08/06/15 1946

## 2015-08-06 NOTE — ED Notes (Signed)
Telephone  Number  Verified   With    Pt  -  Pt  Advised   Would be   Notified  Of  The    thyriod   Test         Pt  Informed   Of  The  Importance  Of  followup  With a  PCP          REVIEWED   WITH PT

## 2015-08-06 NOTE — Discharge Instructions (Signed)
Drink plenty of fluids as discussed, use medicine as prescribed, and mucinex or delsym for cough. Return or see your doctor if further problems. We will call with your thyroid report in a couple days. Continue your thyroid medicine until then.

## 2015-08-06 NOTE — ED Notes (Signed)
Pt  Has  Symptoms  Of  Cough   /    Congestion              And   Cough           X  1  Week  Daughter    Is  Ill  As  Well       Pt  Has   History  Of  thyriod  Disease

## 2015-08-07 NOTE — ED Notes (Signed)
Patient called to inquire about her thyroid test results. After verifying ID, discussed elevated TSH report. Patient expressed desire to have her Rx called in to [place], [place] After discussion w JD Kindl, called synthroid 10 mcg to [facility]. Spoke directly w pharmacist

## 2015-08-15 ENCOUNTER — Emergency Department (HOSPITAL_COMMUNITY): Payer: Self-pay

## 2015-08-15 ENCOUNTER — Encounter (HOSPITAL_COMMUNITY): Payer: Self-pay | Admitting: Emergency Medicine

## 2015-08-15 ENCOUNTER — Emergency Department (HOSPITAL_COMMUNITY)
Admission: EM | Admit: 2015-08-15 | Discharge: 2015-08-15 | Disposition: A | Discharge status: Home / Self Care | Payer: Self-pay | Attending: Emergency Medicine | Admitting: Emergency Medicine

## 2015-08-15 DIAGNOSIS — R05 Cough: Secondary | ICD-10-CM

## 2015-08-15 DIAGNOSIS — G933 Postviral fatigue syndrome: Secondary | ICD-10-CM | POA: Insufficient documentation

## 2015-08-15 DIAGNOSIS — R058 Other specified cough: Secondary | ICD-10-CM

## 2015-08-15 DIAGNOSIS — Z862 Personal history of diseases of the blood and blood-forming organs and certain disorders involving the immune mechanism: Secondary | ICD-10-CM | POA: Insufficient documentation

## 2015-08-15 DIAGNOSIS — Z79899 Other long term (current) drug therapy: Secondary | ICD-10-CM | POA: Insufficient documentation

## 2015-08-15 DIAGNOSIS — E039 Hypothyroidism, unspecified: Secondary | ICD-10-CM | POA: Insufficient documentation

## 2015-08-15 DIAGNOSIS — J45909 Unspecified asthma, uncomplicated: Secondary | ICD-10-CM | POA: Insufficient documentation

## 2015-08-15 MED ORDER — CETIRIZINE HCL 10 MG PO TABS: 10.0000 mg | ORAL_TABLET | Freq: Every day | ORAL | Status: DC

## 2015-08-15 MED ORDER — HYDROCODONE-HOMATROPINE 5-1.5 MG/5ML PO SYRP: 5.0000 mL | ORAL_SOLUTION | Freq: Four times a day (QID) | ORAL | Status: DC | PRN

## 2015-08-15 NOTE — ED Provider Notes (Signed)
CSN: [id number]     Arrival date & time 08/15/15  1150 History  By signing my name below, I, [name], attest that this documentation has been prepared under the direction and in the presence of [facility], PA-C. Electronically Signed: [name] ED Scribe. 08/15/2015. 12:58 PM.    No chief complaint on file.  The history is provided by the patient. No language interpreter was used.   HPI Comments: Miranda Fowler is a 34 y.o. female who presents to the Emergency Department complaining of an intermittent nonproductive cough onset 1 month ago; unimproved by robitussin, alka seltzer, albuterol, and zithromax.  Associated symptoms include CP and abdominal wall pain only with cough.  Patient was seen at urgent care on 10/19 for the same where she was dx'd with asthma, given a breathing treatment, and prescribe antibiotics/albuterol.  She denies fever, sore throat.   Past Medical History  Diagnosis Date  . Hypothyroidism     post-radiation at age 11  . Anemia     Chronic problem for patient.  Followed by Heme, receives IV iron transfusions secondary to inability to tolerate PO supplements.     Past Surgical History  Procedure Laterality Date  . Bilateral salpingectomy Bilateral 2008   No family history on file. Social History  Substance Use Topics  . Smoking status: Never Smoker   . Smokeless tobacco: Not on file  . Alcohol Use: No   OB History    No data available     Review of Systems  Constitutional: Negative for fever.  HENT: Negative for sore throat.   Respiratory: Positive for cough.   All other systems reviewed and are negative.     Allergies  Review of patient's allergies indicates no known allergies.  Home Medications   Prior to Admission medications   Medication Sig Start Date End Date Taking? Authorizing Provider  azithromycin (ZITHROMAX Z-PAK) 250 MG tablet Take as directed on pack 08/06/15   Billy Fischer, MD  ipratropium (ATROVENT) 0.06 % nasal  spray Place 2 sprays into both nostrils 4 (four) times daily. 08/06/15   Billy Fischer, MD  levothyroxine (SYNTHROID, LEVOTHROID) 100 MCG tablet Take 1 tablet (100 mcg total) by mouth daily before breakfast. 04/06/15   Melony Overly, MD  meloxicam (MOBIC) 15 MG tablet Take 1 tablet (15 mg total) by mouth daily. Take 1 pill daily X 14 days then as needed 04/01/14   Gerda Diss, MD   LMP 07/26/2015 Physical Exam  Constitutional: She is oriented to person, place, and time. She appears well-developed and well-nourished. No distress.  HENT:  Head: Normocephalic and atraumatic.  Mouth/Throat: Oropharynx is clear and moist. No oropharyngeal exudate.  Eyes: Conjunctivae and EOM are normal.  Neck: Neck supple. No tracheal deviation present.  Cardiovascular: Normal rate and regular rhythm.  Exam reveals no gallop and no friction rub.   No murmur heard. Pulmonary/Chest: Effort normal and breath sounds normal. No respiratory distress. She has no wheezes. She has no rales. She exhibits no tenderness.  Abdominal: Soft. There is no tenderness.  Musculoskeletal: Normal range of motion.  Neurological: She is alert and oriented to person, place, and time.  Speech is goal-oriented. Moves limbs without ataxia.   Skin: Skin is warm and dry.  Psychiatric: She has a normal mood and affect. Her behavior is normal.  Nursing note and vitals reviewed.   ED Course  Procedures (including critical care time)  DIAGNOSTIC STUDIES: Oxygen Saturation is 98% on RA, normal  by my interpretation.    COORDINATION OF CARE:  12:57 PM Discussed treatment plan with patient at bedside.  Patient acknowledges and agrees with plan.    Labs Review Labs Reviewed - No data to display  Imaging Review Dg Chest 2 View  08/15/2015  CLINICAL DATA:  Shortness of breath for 1 month, asthma EXAM: CHEST  2 VIEW COMPARISON:  01/01/2010 FINDINGS: Upper normal heart size. Mediastinal contours and pulmonary vascularity normal. Lungs  clear. No pleural effusion or pneumothorax. Bones unremarkable. IMPRESSION: No acute abnormalities. Electronically Signed   By: Lavonia Dana M.D.   On: 08/15/2015 12:51   I have personally reviewed and evaluated these images and lab results as part of my medical decision-making.   EKG Interpretation None      MDM   Final diagnoses:  Post-viral cough syndrome    2:34 PM Patient likely have post viral cough syndrome following a virus from last week. Patient will be treated with hycodan and zyrtec. Patient advised symptoms could last for up to 3 months. She will be treated symptomatically. Chest xray unremarkable for pneumonia. Vitals stable and patient afebrile.   I personally performed the services described in this documentation, which was scribed in my presence. The recorded information has been reviewed and is accurate.    Alvina Chou, PA-C 08/15/15 [place], MD 08/15/15 1530

## 2015-08-15 NOTE — Discharge Instructions (Signed)
Take hycodan as needed for cough. Take zyrtec daily as needed for nasal congestion. Refer to attached documents for more information.

## 2015-08-15 NOTE — ED Notes (Signed)
Patient states dry cough x 1 month.  Patient states was seen at [facility] last week for same.  Patient states didn't help.   Patient denies other symptoms.

## 2015-11-03 ENCOUNTER — Other Ambulatory Visit (HOSPITAL_COMMUNITY)
Admission: RE | Admit: 2015-11-03 | Discharge: 2015-11-03 | Disposition: A | Discharge status: Home / Self Care | Payer: BLUE CROSS/BLUE SHIELD | Source: Ambulatory Visit | Attending: Family Medicine | Admitting: Family Medicine

## 2015-11-03 ENCOUNTER — Telehealth: Payer: Self-pay | Admitting: Family Medicine

## 2015-11-03 ENCOUNTER — Ambulatory Visit (INDEPENDENT_AMBULATORY_CARE_PROVIDER_SITE_OTHER): Payer: BLUE CROSS/BLUE SHIELD | Admitting: Family Medicine

## 2015-11-03 ENCOUNTER — Encounter: Payer: Self-pay | Admitting: Family Medicine

## 2015-11-03 VITALS — BP 111/68 | HR 69 | Temp 98.3°F | Ht 64.0 in | Wt 162.2 lb

## 2015-11-03 DIAGNOSIS — Z1151 Encounter for screening for human papillomavirus (HPV): Secondary | ICD-10-CM | POA: Diagnosis not present

## 2015-11-03 DIAGNOSIS — Z113 Encounter for screening for infections with a predominantly sexual mode of transmission: Secondary | ICD-10-CM | POA: Diagnosis present

## 2015-11-03 DIAGNOSIS — Z114 Encounter for screening for human immunodeficiency virus [HIV]: Secondary | ICD-10-CM

## 2015-11-03 DIAGNOSIS — Z13220 Encounter for screening for lipoid disorders: Secondary | ICD-10-CM

## 2015-11-03 DIAGNOSIS — Z01419 Encounter for gynecological examination (general) (routine) without abnormal findings: Secondary | ICD-10-CM | POA: Insufficient documentation

## 2015-11-03 DIAGNOSIS — D509 Iron deficiency anemia, unspecified: Secondary | ICD-10-CM

## 2015-11-03 DIAGNOSIS — E890 Postprocedural hypothyroidism: Secondary | ICD-10-CM

## 2015-11-03 DIAGNOSIS — Z0000 Encounter for general adult medical examination without abnormal findings: Secondary | ICD-10-CM

## 2015-11-03 DIAGNOSIS — D649 Anemia, unspecified: Secondary | ICD-10-CM

## 2015-11-03 LAB — IRON AND TIBC
%SAT: 7 % — AB (ref 11–50)
IRON: 30 ug/dL — AB (ref 40–190)
TIBC: 456 ug/dL — ABNORMAL HIGH (ref 250–450)
UIBC: 426 ug/dL — ABNORMAL HIGH (ref 125–400)

## 2015-11-03 LAB — CBC
HCT: 23.3 % — ABNORMAL LOW (ref 36.0–46.0)
Hemoglobin: 6.5 g/dL — CL (ref 12.0–15.0)
MCH: 20 pg — AB (ref 26.0–34.0)
MCHC: 27.9 g/dL — ABNORMAL LOW (ref 30.0–36.0)
MCV: 71.7 fL — AB (ref 78.0–100.0)
MPV: 9.1 fL (ref 8.6–12.4)
PLATELETS: 275 10*3/uL (ref 150–400)
RBC: 3.25 MIL/uL — AB (ref 3.87–5.11)
RDW: 22.8 % — ABNORMAL HIGH (ref 11.5–15.5)
WBC: 3.9 10*3/uL — ABNORMAL LOW (ref 4.0–10.5)

## 2015-11-03 LAB — LIPID PANEL
CHOL/HDL RATIO: 1.9 ratio (ref <=5.0)
Cholesterol: 142 mg/dL (ref 125–200)
HDL: 75 mg/dL (ref >=46)
LDL CALC: 59 mg/dL (ref <130)
Triglycerides: 41 mg/dL (ref <150)
VLDL: 8 mg/dL (ref <30)

## 2015-11-03 LAB — TSH: TSH: 50.482 u[IU]/mL — ABNORMAL HIGH (ref 0.350–4.500)

## 2015-11-03 MED ORDER — MELOXICAM 15 MG PO TABS: 15.0000 mg | ORAL_TABLET | Freq: Every day | ORAL | Status: DC | PRN

## 2015-11-03 MED ORDER — LEVOTHYROXINE SODIUM 100 MCG PO TABS: 100.0000 ug | ORAL_TABLET | Freq: Every day | ORAL | Status: DC

## 2015-11-03 MED ORDER — CETIRIZINE HCL 10 MG PO TABS: 10.0000 mg | ORAL_TABLET | Freq: Every day | ORAL | Status: DC

## 2015-11-03 NOTE — Patient Instructions (Signed)
Thank you so much for coming to visit me today! We will check several labs today. I will contact you by phone or letter with the results. We also obtained a pap smear today. I will let you know the results. Please take Mobic daily as needed for knee pain. If it fails to improve or worsens, please return for further workup. Please follow up in one year or sooner depending on your lab results. Thanks again! Dr. Gerlean Ren

## 2015-11-03 NOTE — Progress Notes (Signed)
Subjective:     Patient ID: Miranda Fowler, female   DOB: [birth year], 35 y.o.   MRN: [id number]  HPI Miranda Fowler is a 35yo female presenting today for annual exam. - History of hypothyroidism s/p radiation at age 43. TSH elevated to 88 in 07/2015 and synthroid adjusted to 12mcg. Reports being out of her thyroid medication for one week. - History of anemia noted with last CBC showing hemoglobin of 10.3. Notes symptoms of fatigue, but denies dizziness. History of heavy periods noted throughout chart. Was previously followed by heme/onc for regular IV iron infusions. Unable to tolerate PO iron. - Complains of worsening knee pain. Notes popping and cracking. Sometimes knees give out. Does not take any medications for this. - Last pap smear 2012 - Denies changes in personal or family history since last visit - Denies history of smoking - Requests refills of Cetirizine, Mobic, and Synthroid  Review of Systems Per HPI. Other systems negative.    Objective:   Physical Exam  Constitutional: She appears well-developed and well-nourished. No distress.  HENT:  Head: Normocephalic and atraumatic.  Right Ear: External ear normal.  Left Ear: External ear normal.  Mouth/Throat: Oropharynx is clear and moist.  Eyes: Pupils are equal, round, and reactive to light. No scleral icterus.  Conjunctiva pale  Neck: Normal range of motion. No thyromegaly present.  Cardiovascular: Normal rate and regular rhythm.  Exam reveals no gallop and no friction rub.   No murmur heard. Pulmonary/Chest: Effort normal. No respiratory distress. She has no wheezes.  Abdominal: Soft. Bowel sounds are normal. She exhibits no distension. There is no tenderness.  Genitourinary: Vagina normal.  No cervical tenderness noted. Cervix normal.  Musculoskeletal:  Knees symmetric bilaterally. No effusions noted. No joint line tenderness noted. Mild crepitus noted bilaterally. No patellar apprehension. Lateral and Medial Collateral  ligament intact. Anterior drawer symmetric bilateral. Gait normal.   Lymphadenopathy:    She has no cervical adenopathy.  Skin: No rash noted.  Psychiatric: She has a normal mood and affect. Her behavior is normal.      Assessment and Plan:     Iron deficiency anemia - Recheck CBC and Iron Studies today. CBC with significantly lower hemoglobin of 6.5 and Iron Studies with Iron low at 30, low Saturation of 7%, and elevated TIBC of 456, indicated iron deficiency anemia as etiology. History of Menorrhagia noted, previously followed by Heme/Onc for Iron Infusions due to intolerance of oral Iron. - Results returned late 11/03/15. Miranda Fowler stable with no symptoms of anemia and stable vitals, so will contact in morning to discuss results. - Ensure follow up with Heme/Onc for Iron Infusions - Follow CBC closely with Heme/Onc or [place]. To present to ED for transfusion if becomes symptomatic.   Hypothyroidism - Recheck TSH. Adjust Synthroid accordingly.  Preventative health care - Pap smear today - Lipid panel normal.  - HIV, Gonorrhea, Chlamydia Screen

## 2015-11-03 NOTE — Assessment & Plan Note (Addendum)
-   Pap smear today - Lipid panel normal.  - HIV, Gonorrhea, Chlamydia Screen

## 2015-11-03 NOTE — Assessment & Plan Note (Signed)
-   Recheck TSH. Adjust Synthroid accordingly.

## 2015-11-03 NOTE — Assessment & Plan Note (Signed)
-   Recheck CBC and Iron Studies today. CBC with significantly lower hemoglobin of 6.5 and Iron Studies with Iron low at 30, low Saturation of 7%, and elevated TIBC of 456, indicated iron deficiency anemia as etiology. History of Menorrhagia noted, previously followed by Heme/Onc for Iron Infusions due to intolerance of oral Iron. - Results returned late 11/03/15. Miranda Fowler stable with no symptoms of anemia and stable vitals, so will contact in morning to discuss results. - Ensure follow up with Heme/Onc for Iron Infusions - Follow CBC closely with Heme/Onc or [place]. To present to ED for transfusion if becomes symptomatic.

## 2015-11-03 NOTE — Telephone Encounter (Signed)
Received a call from [facility] with critical Hgb 6.5.  Patient seen in clinic today for routine well woman exam.  Conjunctiva at that time noted to be pale.  Patient also with some malaise at home.  No tachycardia, SOB.  Discussed results with both Dr Gerlean Ren and Dr Ree Kida.  Per discussion, appears to be a slow downtrend of hgb.  No concern for acute blood loss.  Dr Gerlean Ren to call patient tomorrow with results.  Iron studies consistent with Iron deficiency anemia.  No known bleed.  Dr Gerlean Ren to order outpatient iron therapies.   Results for orders placed or performed in visit on 11/03/15 (from the past 24 hour(s))  TSH     Status: None (Preliminary result)   Collection Time: 11/03/15  9:24 AM  Result Value Ref Range   TSH  0.350 - 4.500 uIU/mL   Narrative   Performed at:  [address], [id number]                [place], [place] [address]  CBC     Status: Abnormal   Collection Time: 11/03/15  9:24 AM  Result Value Ref Range   WBC 3.9 (L) 4.0 - 10.5 K/uL   RBC 3.25 (L) 3.87 - 5.11 MIL/uL   Hemoglobin 6.5 (LL) 12.0 - 15.0 g/dL   HCT 23.3 (L) 36.0 - 46.0 %   MCV 71.7 (L) 78.0 - 100.0 fL   MCH 20.0 (L) 26.0 - 34.0 pg   MCHC 27.9 (L) 30.0 - 36.0 g/dL   RDW 22.8 (H) 11.5 - 15.5 %   Platelets 275 150 - 400 K/uL   MPV 9.1 8.6 - 12.4 fL   Narrative   Performed at:  [address], [id number]                [place], [place] [address]  Lipid panel     Status: None   Collection Time: 11/03/15  9:24 AM  Result Value Ref Range   Cholesterol 142 125 - 200 mg/dL   Triglycerides 41 <150 mg/dL   HDL 75 >=46 mg/dL   Total CHOL/HDL Ratio 1.9 <=5.0 Ratio   VLDL 8 <30 mg/dL   LDL Cholesterol 59 <130 mg/dL   Narrative   Performed at:  [place], [id number]                [place], [place] [address]  HIV antibody     Status: None (Preliminary result)   Collection Time: 11/03/15  9:24 AM  Result  Value Ref Range   HIV 1&2 Ab, 4th Generation  NONREACTIVE   Narrative   Performed at:  [facility]                [id number]                [place], [place] [address]  Iron and TIBC     Status: Abnormal   Collection Time: 11/03/15  9:24 AM  Result Value Ref Range   Iron 30 (L) 40 - 190 ug/dL   UIBC 426 (H) 125 - 400 ug/dL   TIBC 456 (H) 250 - 450 ug/dL   %  SAT 7 (L) 11 - 50 %   Narrative   Performed at:  [place], [id number]                [place], [place] [address] Critical result called to, read back by, and verified with: DR Vada Swift AT 734PM [id number] MEIER,R/HEMOGLOBIN 6.5 CL/ Result repeated and verified.   Elise Knobloch M. Lajuana Ripple, DO PGY-2, [place]

## 2015-11-04 ENCOUNTER — Telehealth: Payer: Self-pay | Admitting: Family Medicine

## 2015-11-04 ENCOUNTER — Other Ambulatory Visit: Payer: Self-pay | Admitting: Family Medicine

## 2015-11-04 DIAGNOSIS — D500 Iron deficiency anemia secondary to blood loss (chronic): Secondary | ICD-10-CM

## 2015-11-04 LAB — CERVICOVAGINAL ANCILLARY ONLY
CHLAMYDIA, DNA PROBE: NEGATIVE
NEISSERIA GONORRHEA: NEGATIVE

## 2015-11-04 LAB — HIV ANTIBODY (ROUTINE TESTING W REFLEX): HIV 1&2 Ab, 4th Generation: NONREACTIVE

## 2015-11-04 MED ORDER — LEVOTHYROXINE SODIUM 25 MCG PO TABS: 25.0000 ug | ORAL_TABLET | Freq: Every day | ORAL | Status: DC

## 2015-11-04 NOTE — Telephone Encounter (Signed)
Reports anemia due to menorrhagia, which is stable. Hasn't been to Heme/Onc in several months because insurance problems. Used to follow with [facility]. Notes some fatigue. Dizziness rare, but often occurs after walking some distance. Verified unable to take PO iron. Requires iron infusions. Instructed to go to ED immediately for admission for transfusion if she develops worsening dizziness.

## 2015-11-04 NOTE — Telephone Encounter (Signed)
Contacted concerning thyroid levels. Will increase Synthroid to 164mcg daily. Stressed importance of compliance. Follow up in 8weeks to recheck TSH.

## 2015-11-05 ENCOUNTER — Encounter: Payer: Self-pay | Admitting: Family Medicine

## 2015-11-05 LAB — CYTOLOGY - PAP

## 2015-11-06 ENCOUNTER — Telehealth: Payer: Self-pay | Admitting: Family Medicine

## 2015-11-06 DIAGNOSIS — D500 Iron deficiency anemia secondary to blood loss (chronic): Secondary | ICD-10-CM

## 2015-11-06 NOTE — Telephone Encounter (Signed)
Contacted concerning hematology follow up. States she has been trying to contact to set up an appointment, but has not been able to get in touch with them. Recommended coming tomorrow for CBC. Will discuss need for admission for transfusion after CBC results return. Notes worsening fatigue and shortness of breath with exertion. Recommended going to ED at this time for transfusion however Miranda Fowler refuses, stating she has her kids and she wants to see what her hemoglobin is tomorrow instead.  Continue to monitor for worsening signs of anemia. To call 911 if symptoms continue to worsen.

## 2015-11-07 ENCOUNTER — Other Ambulatory Visit: Payer: Self-pay | Admitting: Family Medicine

## 2015-11-07 ENCOUNTER — Observation Stay (HOSPITAL_COMMUNITY)
Admission: AD | Admit: 2015-11-07 | Discharge: 2015-11-08 | Disposition: A | Discharge status: Home / Self Care | Payer: BLUE CROSS/BLUE SHIELD | Source: Ambulatory Visit | Attending: Family Medicine | Admitting: Family Medicine

## 2015-11-07 ENCOUNTER — Other Ambulatory Visit (INDEPENDENT_AMBULATORY_CARE_PROVIDER_SITE_OTHER): Payer: BLUE CROSS/BLUE SHIELD

## 2015-11-07 DIAGNOSIS — R0602 Shortness of breath: Secondary | ICD-10-CM | POA: Diagnosis not present

## 2015-11-07 DIAGNOSIS — D500 Iron deficiency anemia secondary to blood loss (chronic): Secondary | ICD-10-CM

## 2015-11-07 DIAGNOSIS — E039 Hypothyroidism, unspecified: Secondary | ICD-10-CM | POA: Diagnosis not present

## 2015-11-07 DIAGNOSIS — E032 Hypothyroidism due to medicaments and other exogenous substances: Secondary | ICD-10-CM

## 2015-11-07 DIAGNOSIS — D509 Iron deficiency anemia, unspecified: Secondary | ICD-10-CM

## 2015-11-07 DIAGNOSIS — D649 Anemia, unspecified: Secondary | ICD-10-CM | POA: Insufficient documentation

## 2015-11-07 DIAGNOSIS — N920 Excessive and frequent menstruation with regular cycle: Secondary | ICD-10-CM | POA: Diagnosis not present

## 2015-11-07 LAB — COMPREHENSIVE METABOLIC PANEL
ALK PHOS: 34 U/L — AB (ref 38–126)
ALT: 14 U/L (ref 14–54)
AST: 25 U/L (ref 15–41)
Albumin: 3.6 g/dL (ref 3.5–5.0)
Anion gap: 8 (ref 5–15)
BILIRUBIN TOTAL: 0.4 mg/dL (ref 0.3–1.2)
BUN: 11 mg/dL (ref 6–20)
CALCIUM: 9.3 mg/dL (ref 8.9–10.3)
CO2: 26 mmol/L (ref 22–32)
CREATININE: 1.06 mg/dL — AB (ref 0.44–1.00)
Chloride: 106 mmol/L (ref 101–111)
GFR calc Af Amer: >60 mL/min (ref >60)
Glucose, Bld: 90 mg/dL (ref 65–99)
POTASSIUM: 3.7 mmol/L (ref 3.5–5.1)
Sodium: 140 mmol/L (ref 135–145)
TOTAL PROTEIN: 7.3 g/dL (ref 6.5–8.1)

## 2015-11-07 LAB — CBC WITH DIFFERENTIAL/PLATELET
BASOS PCT: 0 %
Basophils Absolute: 0 10*3/uL (ref 0.0–0.1)
EOS ABS: 0 10*3/uL (ref 0.0–0.7)
Eosinophils Relative: 0 %
HCT: 21.5 % — ABNORMAL LOW (ref 36.0–46.0)
Hemoglobin: 6 g/dL — CL (ref 12.0–15.0)
LYMPHS ABS: 2.2 10*3/uL (ref 0.7–4.0)
Lymphocytes Relative: 50 %
MCH: 20.8 pg — AB (ref 26.0–34.0)
MCHC: 27.9 g/dL — AB (ref 30.0–36.0)
MCV: 74.4 fL — AB (ref 78.0–100.0)
MONO ABS: 0.5 10*3/uL (ref 0.1–1.0)
Monocytes Relative: 10 %
NEUTROS ABS: 1.8 10*3/uL (ref 1.7–7.7)
Neutrophils Relative %: 40 %
PLATELETS: 289 10*3/uL (ref 150–400)
RBC: 2.89 MIL/uL — ABNORMAL LOW (ref 3.87–5.11)
RDW: 23.6 % — ABNORMAL HIGH (ref 11.5–15.5)
WBC: 4.5 10*3/uL (ref 4.0–10.5)

## 2015-11-07 LAB — PREPARE RBC (CROSSMATCH)

## 2015-11-07 LAB — RETICULOCYTES
RBC.: 2.89 MIL/uL — AB (ref 3.87–5.11)
Retic Count, Absolute: 31.8 10*3/uL (ref 19.0–186.0)
Retic Ct Pct: 1.1 % (ref 0.4–3.1)

## 2015-11-07 LAB — IRON AND TIBC
Iron: 8 ug/dL — ABNORMAL LOW (ref 28–170)
SATURATION RATIOS: 2 % — AB (ref 10.4–31.8)
TIBC: 486 ug/dL — ABNORMAL HIGH (ref 250–450)
UIBC: 478 ug/dL

## 2015-11-07 LAB — PROTIME-INR
INR: 1.14 (ref 0.00–1.49)
PROTHROMBIN TIME: 14.8 s (ref 11.6–15.2)

## 2015-11-07 LAB — VITAMIN B12: Vitamin B-12: 344 pg/mL (ref 180–914)

## 2015-11-07 LAB — FOLATE: FOLATE: 21.1 ng/mL (ref >5.9)

## 2015-11-07 LAB — FERRITIN: Ferritin: 3 ng/mL — ABNORMAL LOW (ref 11–307)

## 2015-11-07 LAB — POCT HEMOGLOBIN: HEMOGLOBIN: 5.5 g/dL — AB (ref 12.2–16.2)

## 2015-11-07 MED ORDER — SODIUM CHLORIDE 0.9 % IJ SOLN
3.0000 mL | Freq: Two times a day (BID) | INTRAMUSCULAR | Status: DC
  Administered 2015-11-08: 3 mL via INTRAVENOUS

## 2015-11-07 MED ORDER — SODIUM CHLORIDE 0.9 % IJ SOLN: 3.0000 mL | INTRAMUSCULAR | Status: DC | PRN

## 2015-11-07 MED ORDER — SODIUM CHLORIDE 0.9 % IJ SOLN
3.0000 mL | Freq: Two times a day (BID) | INTRAMUSCULAR | Status: DC
  Administered 2015-11-07: 3 mL via INTRAVENOUS

## 2015-11-07 MED ORDER — LEVOTHYROXINE SODIUM 25 MCG PO TABS
125.0000 ug | ORAL_TABLET | Freq: Every day | ORAL | Status: DC
  Administered 2015-11-08: 125 ug via ORAL
  Filled 2015-11-07: qty 1

## 2015-11-07 MED ORDER — SODIUM CHLORIDE 0.9 % IV SOLN: 250.0000 mL | INTRAVENOUS | Status: DC | PRN

## 2015-11-07 MED ORDER — SODIUM CHLORIDE 0.9 % IV SOLN
Freq: Once | INTRAVENOUS | Status: AC
  Administered 2015-11-07: 20:00:00 via INTRAVENOUS

## 2015-11-07 MED ORDER — DIPHENHYDRAMINE HCL 25 MG PO CAPS
25.0000 mg | ORAL_CAPSULE | Freq: Once | ORAL | Status: AC
  Administered 2015-11-07: 25 mg via ORAL
  Filled 2015-11-07: qty 1

## 2015-11-07 MED ORDER — ACETAMINOPHEN 325 MG PO TABS
650.0000 mg | ORAL_TABLET | Freq: Once | ORAL | Status: AC
  Administered 2015-11-07: 650 mg via ORAL
  Filled 2015-11-07: qty 2

## 2015-11-07 NOTE — H&P (Signed)
[facility] Admission History and Physical Service Pager: [phone]  Patient name: Miranda Fowler Medical record number: [id number] Date of birth: Nov 25, 1980 Age: 35 y.o. Gender: female  Primary Care Provider: Junie Panning, DO Consultants: none Code Status: full  Chief Complaint: anemia  Assessment and Plan: Miranda Fowler is a 35 y.o. female presenting with anemia . PMH is significant for anemia, menorrhagia, and hypothyroidism 2/2 Ablation therapy for hyperthyroidism.   Anemia of blood loss: Hgb 5.5. Anemia panel and CBC suggestive for iron deficiency anemia. This is likely secondary to her menorrhagia. Differential diagnosis are polyp, adenomyosis, leiomyoma, malignancy and hyperplasia, coagulopathy, ovulatory dysfunction, endometrial, iatrogenic and hypothyroidism. Patient has family history of bleeding d/o (mother, cousin and nephew). Gonadal axis dysfunction is also a possibility with history irradiation to her neck at age 52 for "heavy bleeding", and subsequent hypothyroidism. Her TSH was 50.48 four days ago. Less suspicion for malignancy given regular cycle and her age. She had a normal pelvic US in 2011. Has been a chronic problem for her for years 2009 last record in the chart. Pt. Has been treated by Dr. Alen Blew ([facility]) in the past with IV Iron given intolerance to PO iron. Has tried OCP's without success, but has not undergone further intervention. Additionally, unable to find coagulopathy workup in the chart. Pt. Does not report personal history of bleeding. She is currently on day 2 of her period.   -Admit to telemetry bed. Attending Dr. Ree Kida -Anemia panel with ferritin -Retic -Type and cross - PT, aPTT, INR, B12, Folate -Transfuse 3 units after anemia panel - post-transfusion H&H - am CBC - Can consider coagulopathy labs but patient with private insurance and may not have good coverage. - consider pelvic US - Echo given chronic severe  anemia and significantly elevated TSH with SOB, and Dizziness.  - She would benefit from further coagulopathy workup as an outpatient. Preliminary / general workup here.  - Additionally, consider OB/GYN outpt. referral for possible hysterectomy pending coagulopathy workup.   Hypothyroidism: last TSH 50.48. On synthroid 125 mcg at home -Will continue synthroid at 125 mcg - Will start her here, but she will likely need more than this given significantly elevated TSH. She says she has been as high as 200 in the past. 125 mcg started earlier this week.  - Close outpatient follow up as well.   FEN/GI: -regular diet  Prophylaxis: SCD given menorrhagia  Disposition: home after blood transfusion if Hgb stable.  History of Present Illness:  Miranda Fowler is a 35 y.o. female presenting as a direct admit with anemia.  Patient reports fatigue, palpitation, lightheadedness and feeling cold. She has history of anemia. She reports heavy period every 30 days. She says it usually lasts about 5 days but very heavy. She is on period right now. In the past, she gets her blood checked at least once in a year. However, she couldn't follow up in over a year because she didn't have insurance. She now have a private insurance though [place]. She presented to [facility] on 01/16 and found to have Hgb of 6.6. Anemia profile was significant for low iron, marginally elevated TIBC and low saturation level. Ferritin wasn't check. Repeat Hgb in clinic today 5.5.     Patient reports not tolerating by mouth iron in the past due to emesis. Patient had thyroid radioiodine ablation  at the age of 89 for hyperthyroidism and ? due to "heavy bleeding". It is also indicated in her chart  that patient was seen by heme/onc for iron infusion in the past.  She tried OCP that didn't help. She is not on any birth control now. She also reports going to gyn in the past. There was a discussion about possible partial hysterectomy at one point but she  lost her insurance. She hasn't had an explanation for her bleeding yet. She never had blood transfusion in the past. She reports intermittent NSAID use. Denies blood in stool or dark stool. She has history of c/s.  She reports family history of menorrhagia (mother and cousin). Nephew also with history of bleeding disorder. Mother also with SLE.  Patient has history of hypothyroidism since irradiation. TSH  50.48 on 11/03/2015. Her synthroid was increased to 125 mcg at that time.  Denies smoking, drinking and recreational drug use.  Review Of Systems: Per HPI Otherwise the remainder of the systems were negative.  Patient Active Problem List   Diagnosis Date Noted  . Anemia 11/07/2015  . Right shoulder pain 04/01/2014  . Screen for STD (sexually transmitted disease) 11/05/2013  . Preventative health care 02/28/2013  . Hypothyroidism 04/06/2010  . Iron deficiency anemia 04/06/2010  . MENORRHAGIA 04/06/2010    Past Medical History: Past Medical History  Diagnosis Date  . Hypothyroidism     post-radiation at age 71  . Anemia     Chronic problem for patient.  Followed by Heme, receives IV iron transfusions secondary to inability to tolerate PO supplements.      Past Surgical History: Past Surgical History  Procedure Laterality Date  . Bilateral salpingectomy Bilateral 2008    Social History: Social History  Substance Use Topics  . Smoking status: Never Smoker   . Smokeless tobacco: Not on file  . Alcohol Use: No    Family History: No family history on file.  Allergies and Medications: No Known Allergies No current facility-administered medications on file prior to encounter.   Current Outpatient Prescriptions on File Prior to Encounter  Medication Sig Dispense Refill  . cetirizine (ZYRTEC ALLERGY) 10 MG tablet Take 1 tablet (10 mg total) by mouth daily. 30 tablet 5  . levothyroxine (SYNTHROID, LEVOTHROID) 100 MCG tablet Take 1 tablet (100 mcg total) by mouth daily  before breakfast. 90 tablet 4  . levothyroxine (SYNTHROID, LEVOTHROID) 25 MCG tablet Take 1 tablet (25 mcg total) by mouth daily before breakfast. 90 tablet 3  . meloxicam (MOBIC) 15 MG tablet Take 1 tablet (15 mg total) by mouth daily as needed for pain. 30 tablet 3    Objective: BP 127/75 mmHg  Pulse 68  Temp(Src) 99 F (37.2 C) (Oral)  Resp 23  Ht 5\' 4"  (1.626 m)  Wt 162 lb 3.2 oz (73.573 kg)  BMI 27.83 kg/m2  SpO2 100%  LMP 10/11/2015 Exam: Gen: appears well, able to sit up in bed easily Eyes: conjunctivae pale Oropharynx: clear, moist but pale Neck: supple, no LAD CV: regular rate and rythm. S1 & S2 audible, 1/6 SEM over RUSB Resp: no apparent work of breathing, clear to auscultation bilaterally. GI: bowel sounds normal, mild tenderness over epigastric area with deep palpation, no rebound or guarding, no mass.  MSK: finger nails pale, mild clubbing    Labs and Imaging: CBC BMET   Recent Labs Lab 11/07/15 1950  WBC 4.5  HGB 6.0*  HCT 21.5*  PLT 289    Recent Labs Lab 11/07/15 1950  NA 140  K 3.7  CL 106  CO2 26  BUN 11  CREATININE 1.06*  GLUCOSE 90  CALCIUM 9.3     Aquilla Hacker, MD 11/07/2015, 9:11 PM PGY-1, [place] Intern pager: [phone], text pages welcome  I agree with the above evaluation, assessment, and plan. Any correctional changes can be noted in [facility].   Aquilla Hacker, MD Family Medicine Resident - PGY 2

## 2015-11-07 NOTE — Progress Notes (Signed)
CRITICAL VALUE ALERT  Critical value received:  Hgb 6.0  Date of notification:  11/07/2015  Time of notification:  8:55 PM  Critical value read back:Yes.    Nurse who received alert:  Kandyce Rud RN  MD notified (1st page): MD aware, new orders placed  Time of first page: 8:59 PM

## 2015-11-08 ENCOUNTER — Encounter (HOSPITAL_COMMUNITY): Payer: Self-pay | Admitting: *Deleted

## 2015-11-08 ENCOUNTER — Observation Stay (HOSPITAL_COMMUNITY): Payer: BLUE CROSS/BLUE SHIELD

## 2015-11-08 DIAGNOSIS — D500 Iron deficiency anemia secondary to blood loss (chronic): Secondary | ICD-10-CM | POA: Diagnosis not present

## 2015-11-08 DIAGNOSIS — R0602 Shortness of breath: Secondary | ICD-10-CM | POA: Diagnosis not present

## 2015-11-08 DIAGNOSIS — E032 Hypothyroidism due to medicaments and other exogenous substances: Secondary | ICD-10-CM | POA: Diagnosis not present

## 2015-11-08 LAB — URINALYSIS, ROUTINE W REFLEX MICROSCOPIC
BILIRUBIN URINE: NEGATIVE
Glucose, UA: NEGATIVE mg/dL
Ketones, ur: 15 mg/dL — AB
Nitrite: POSITIVE — AB
PROTEIN: 100 mg/dL — AB
Specific Gravity, Urine: 1.025 (ref 1.005–1.030)
pH: 6.5 (ref 5.0–8.0)

## 2015-11-08 LAB — URINE MICROSCOPIC-ADD ON: SQUAMOUS EPITHELIAL / LPF: NONE SEEN

## 2015-11-08 LAB — BASIC METABOLIC PANEL
Anion gap: 9 (ref 5–15)
BUN: 11 mg/dL (ref 6–20)
CALCIUM: 8.7 mg/dL — AB (ref 8.9–10.3)
CO2: 25 mmol/L (ref 22–32)
CREATININE: 1.18 mg/dL — AB (ref 0.44–1.00)
Chloride: 106 mmol/L (ref 101–111)
GFR calc Af Amer: >60 mL/min (ref >60)
GFR, EST NON AFRICAN AMERICAN: 59 mL/min — AB (ref >60)
Glucose, Bld: 92 mg/dL (ref 65–99)
POTASSIUM: 3.9 mmol/L (ref 3.5–5.1)
SODIUM: 140 mmol/L (ref 135–145)

## 2015-11-08 LAB — CBC
HCT: 34.4 % — ABNORMAL LOW (ref 36.0–46.0)
Hemoglobin: 10.9 g/dL — ABNORMAL LOW (ref 12.0–15.0)
MCH: 24.9 pg — ABNORMAL LOW (ref 26.0–34.0)
MCHC: 31.7 g/dL (ref 30.0–36.0)
MCV: 78.7 fL (ref 78.0–100.0)
PLATELETS: 230 10*3/uL (ref 150–400)
RBC: 4.37 MIL/uL (ref 3.87–5.11)
RDW: 20.3 % — AB (ref 11.5–15.5)
WBC: 5.9 10*3/uL (ref 4.0–10.5)

## 2015-11-08 MED ORDER — MEDROXYPROGESTERONE ACETATE 150 MG/ML IM SUSP
150.0000 mg | Freq: Once | INTRAMUSCULAR | Status: AC
  Administered 2015-11-08: 150 mg via INTRAMUSCULAR
  Filled 2015-11-08: qty 1

## 2015-11-08 NOTE — Discharge Instructions (Signed)
We are glad that you are feeling better.   You have an appointment with Dr. Gerlean Ren at the [facility] on 1/27 at 1:45 pm. Please be sure to go to this appointment to get referred to OB/GYN and to get further blood testing.   If you develop lightheadedness, pass out, chest pain or palpitations, or severe weakness, or worse bleeding then come back to the hospital.   Thanks for letting us take care of you.   Sincerely, Paula Compton, MD Family Medicine - PGY 2  Iron Deficiency Anemia, Adult Anemia is a condition in which there are less red blood cells or hemoglobin in the blood than normal. Hemoglobin is the part of red blood cells that carries oxygen. Iron deficiency anemia is anemia caused by too little iron. It is the most common type of anemia. It may leave you tired and short of breath. CAUSES   Lack of iron in the diet.  Poor absorption of iron, as seen with intestinal disorders.  Intestinal bleeding.  Heavy periods. SIGNS AND SYMPTOMS  Mild anemia may not be noticeable. Symptoms may include:  Fatigue.  Headache.  Pale skin.  Weakness.  Tiredness.  Shortness of breath.  Dizziness.  Cold hands and feet.  Fast or irregular heartbeat. DIAGNOSIS  Diagnosis requires a thorough evaluation and physical exam by your health care provider. Blood tests are generally used to confirm iron deficiency anemia. Additional tests may be done to find the underlying cause of your anemia. These may include:  Testing for blood in the stool (fecal occult blood test).  A procedure to see inside the colon and rectum (colonoscopy).  A procedure to see inside the esophagus and stomach (endoscopy). TREATMENT  Iron deficiency anemia is treated by correcting the cause of the deficiency. Treatment may involve:  Adding iron-rich foods to your diet.  Taking iron supplements. Pregnant or breastfeeding women need to take extra iron because their normal diet usually does not provide  the required amount.  Taking vitamins. Vitamin C improves the absorption of iron. Your health care provider may recommend that you take your iron tablets with a glass of orange juice or vitamin C supplement.  Medicines to make heavy menstrual flow lighter.  Surgery. HOME CARE INSTRUCTIONS   Take iron as directed by your health care provider.  If you cannot tolerate taking iron supplements by mouth, talk to your health care provider about taking them through a vein (intravenously) or an injection into a muscle.  For the best iron absorption, iron supplements should be taken on an empty stomach. If you cannot tolerate them on an empty stomach, you may need to take them with food.  Do not drink milk or take antacids at the same time as your iron supplements. Milk and antacids may interfere with the absorption of iron.  Iron supplements can cause constipation. Make sure to include fiber in your diet to prevent constipation. A stool softener may also be recommended.  Take vitamins as directed by your health care provider.  Eat a diet rich in iron. Foods high in iron include liver, lean beef, whole-grain bread, eggs, dried fruit, and dark green leafy vegetables. SEEK IMMEDIATE MEDICAL CARE IF:   You faint. If this happens, do not drive. Call your local emergency services (911 in [place] if no other help is available.  You have chest pain.  You feel nauseous or vomit.  You have severe or increased shortness of breath with activity.  You feel weak.  You  have a rapid heartbeat.  You have unexplained sweating.  You become light-headed when getting up from a chair or bed. MAKE SURE YOU:   Understand these instructions.  Will watch your condition.  Will get help right away if you are not doing well or get worse.   This information is not intended to replace advice given to you by your health care provider. Make sure you discuss any questions you have with your health care provider.    Document Released: 10/01/2000 Document Revised: 10/25/2014 Document Reviewed: 06/11/2013 Elsevier Interactive Patient Education [employer].

## 2015-11-08 NOTE — Progress Notes (Signed)
Family Medicine Teaching Service Daily Progress Note Intern Pager: [phone]  Patient name: Miranda Fowler Medical record number: [id number] Date of birth: 05-Nov-1980 Age: 35 y.o. Gender: female  Primary Care Provider: Junie Panning, DO Consultants: None Code Status: Full  Pt Overview and Major Events to Date:  11/07/2015 - admitted with symptomatic blood loss anemia.   Assessment and Plan: Miranda Fowler is a 35 y.o. female presenting with anemia . PMH is significant for anemia, menorrhagia, and hypothyroidism 2/2 Ablation therapy for hyperthyroidism.   Anemia of blood loss: Improving symptoms with transfusion x 3 overnight. This is a chronic problem for her and will need further workup / intervention. The question remains whether this is simply menorrhagia induced iron deficiency anemia or if she has an inherited coagulopathy that is contributing. Her laboratory evaluation is reflective of iron deficiency anemia with low iron, feritin, increased TIBC, and decreased saturation. MCV is decreased to 74. Platelets and WBC cell lines appear normal. PT and INR are normal. APTT pending. Retic was cancelled  - Depo provera for help controlling her menorrhagia - referral to OB/GYN for evaluation  - follow up in the clinic next week for further coagulopathy testing - Echocardiogram for cardiac evaluation given severe chronic anemia, and Chronic severe hypothyroidism.  - Post transfusion H/H pending.  - Possible referral back to Heme for further workup / management as well.   - U/A for proteinuria, and APTT pending.   Hypothyroidism: last TSH 50.48. On synthroid 125 mcg at home -Will continue synthroid at 125 mcg - Will start her here, but she will likely need more than this given significantly elevated TSH. She says she has been as high as 200 in the past. 125 mcg started earlier this week.  - Close outpatient follow up as well.   FEN/GI: -regular diet  Prophylaxis: SCD given  menorrhagia  Disposition: home if Hgb stable.   Subjective:  Pt. Feeling much better this morning. Mild headache. She says she is agreeable to depo provera to help with the bleeding and she understands the importance of following up as an outpatient to continue ongoing workup.   Objective: Temp:  [97.8 F (36.6 C)-99 F (37.2 C)] 98.2 F (36.8 C) (01/21 0718) Pulse Rate:  [55-68] 57 (01/21 0718) Resp:  [16-23] 16 (01/21 0718) BP: (98-127)/(55-85) 108/80 mmHg (01/21 0718) SpO2:  [100 %] 100 % (01/21 0718) Weight:  [162 lb 3.2 oz (73.573 kg)] 162 lb 3.2 oz (73.573 kg) (01/20 1750) Physical Exam: General: NAD, resting comfortably in bed.  Cardiovascular: RRR, very soft SEM, 2+ distal pulses, Cap refill < 3 sec.  Respiratory: CTA Bilaterally, appropriate rate, unlabored.  Abdomen: S, NT, ND, +BS.  Extremities: WWP, 2+ distal pulses. Nail spooning noted.   Laboratory:  Recent Labs Lab 11/03/15 0924 11/07/15 1555 11/07/15 1950  WBC 3.9*  --  4.5  HGB 6.5* 5.5* 6.0*  HCT 23.3*  --  21.5*  PLT 275  --  289    Recent Labs Lab 11/07/15 1950  NA 140  K 3.7  CL 106  CO2 26  BUN 11  CREATININE 1.06*  CALCIUM 9.3  PROT 7.3  BILITOT 0.4  ALKPHOS 34*  ALT 14  AST 25  GLUCOSE 90     Aquilla Hacker, MD 11/08/2015, 9:12 AM PGY-2, [place] Intern pager: [phone], text pages welcome

## 2015-11-08 NOTE — Progress Notes (Signed)
  Echocardiogram 2D Echocardiogram has been performed.  Darlina Sicilian M 11/08/2015, 1:15 PM

## 2015-11-08 NOTE — Discharge Summary (Deleted)
[facility] Discharge Summary  Patient name: Miranda Fowler Medical record number: [id number] Date of birth: [birth year] Age: 35 y.o. Gender: female Date of Admission: 11/07/2015  Date of Discharge: 11/07/2014 Admitting Physician: Lupita Dawn, MD  Primary Care Provider: Junie Panning, DO Consultants: none  Indication for Hospitalization: Symptomatic Blood Loss Anemia  Discharge Diagnoses/Problem List:  Iron Deficiency Anemia Menorrhagia Blood Loss Anemia Hypothyroidism  Disposition: Home  Discharge Condition: Stable  Discharge Exam:  Temp: [97.8 F (36.6 C)-99 F (37.2 C)] 98.2 F (36.8 C) (01/21 0718) Pulse Rate: [55-68] 57 (01/21 0718) Resp: [16-23] 16 (01/21 0718) BP: (98-127)/(55-85) 108/80 mmHg (01/21 0718) SpO2: [100 %] 100 % (01/21 0718) Weight: [162 lb 3.2 oz (73.573 kg)] 162 lb 3.2 oz (73.573 kg) (01/20 1750) Physical Exam: General: NAD, resting comfortably in bed.  Cardiovascular: RRR, very soft SEM, 2+ distal pulses, Cap refill < 3 sec.  Respiratory: CTA Bilaterally, appropriate rate, unlabored.  Abdomen: S, NT, ND, +BS.  Extremities: WWP, 2+ distal pulses. Nail spooning noted.   Brief Hospital Course:  Pt. Is a 35 y/o AAF with hx of menorrhagia and severe hypothyroidism who presented to clinic with symptomatic anemia due to heavy menstrual period. This has been going on for some time and she has been followed by Heme (Dr. Alen Blew) in the past who were prescribing her IV Iron monthly for her anemia. She has anemia of iron deficiency due to monthly blood loss 2/2 menorrhagia. She has no clear inherited disorder, though it is unclear whether this workup has been performed yet. She has had a uterine ultrasound in the past with one small fibroid. She was supposed to follow up with OB/GYN for surgical evaluation / possible hysterectomy, but she lost her insurance and has not seen anyone in the past year. She has also not been  taking her Synthroid over the past year either. She was admitted for symptomatic anemia with Hgb of 6.0 on lab evaluation here. She was transfused 3 units of blood which she tolerated well. We performed Echocardiogram prior to discharge for further cardiac evaluation given her chronic severe anemia and hypothyroidism. She was given Depo Provera here in the hospital to reduce the bleeding in her periods while she is followed up as an outpatient and a definitive plan for treatment is determined. She was no longer symptomatic on day of discharge, and safe for discharge to home.   Issues for Follow Up:  1. Hemoglobin / Ongoing bleeding 2. Needs coagulopathy workup / referral to Heme possibly.  3. Referral to OB/GYN for surgical evaluation 4. Titrate up Synthroid given her markedly elevated TSH. She says she has been as high as 200 on Synthroid in the past.   Significant Procedures: None.   Significant Labs and Imaging:   Recent Labs Lab 11/03/15 0924 11/07/15 1555 11/07/15 1950 11/08/15 1020  WBC 3.9*  --  4.5 5.9  HGB 6.5* 5.5* 6.0* 10.9*  HCT 23.3*  --  21.5* 34.4*  PLT 275  --  289 230    Recent Labs Lab 11/07/15 1950 11/08/15 1020  NA 140 140  K 3.7 3.9  CL 106 106  CO2 26 25  GLUCOSE 90 92  BUN 11 11  CREATININE 1.06* 1.18*  CALCIUM 9.3 8.7*  ALKPHOS 34*  --   AST 25  --   ALT 14  --   ALBUMIN 3.6  --       Results/Tests Pending at Time of Discharge: None.  Discharge Medications:    Medication List    TAKE these medications        cetirizine 10 MG tablet  Commonly known as:  ZYRTEC ALLERGY  Take 1 tablet (10 mg total) by mouth daily.     levothyroxine 125 MCG tablet  Commonly known as:  SYNTHROID, LEVOTHROID  Take 125 mcg by mouth daily before breakfast.     meloxicam 15 MG tablet  Commonly known as:  MOBIC  Take 1 tablet (15 mg total) by mouth daily as needed for pain.        Discharge Instructions: Please refer to Patient Instructions section of  EMR for full details.  Patient was counseled important signs and symptoms that should prompt return to medical care, changes in medications, dietary instructions, activity restrictions, and follow up appointments.   Follow-Up Appointments: Follow-up Information    Follow up with [facility], DO. Go on 11/14/2015.   Specialty:  Family Medicine   Why:  Hospital Follow Up - Further Lab Testing and Referral.    Contact information:   [id number] [address] [phone]       Aquilla Hacker, MD 11/08/2015, 12:24 PM PGY-2, [place]

## 2015-11-08 NOTE — Progress Notes (Signed)
Nsg Discharge Note  Admit Date:  11/07/2015 Discharge date: 11/08/2015   Alvin Critchley Merk to be D/C'd Home per MD order.  AVS completed.  Copy for chart, and copy for patient signed, and dated. Patient/caregiver able to verbalize understanding.  Discharge Medication:   Medication List    TAKE these medications        cetirizine 10 MG tablet  Commonly known as:  ZYRTEC ALLERGY  Take 1 tablet (10 mg total) by mouth daily.     levothyroxine 125 MCG tablet  Commonly known as:  SYNTHROID, LEVOTHROID  Take 125 mcg by mouth daily before breakfast.     meloxicam 15 MG tablet  Commonly known as:  MOBIC  Take 1 tablet (15 mg total) by mouth daily as needed for pain.        Discharge Assessment: Filed Vitals:   11/08/15 0718 11/08/15 1337  BP: 108/80 98/65  Pulse: 57 54  Temp: 98.2 F (36.8 C) 98.7 F (37.1 C)  Resp: 16 18   Skin clean, dry and intact without evidence of skin break down, no evidence of skin tears noted. IV catheter discontinued intact. Site without signs and symptoms of complications - no redness or edema noted at insertion site, patient denies c/o pain - only slight tenderness at site.  Dressing with slight pressure applied.  D/c Instructions-Education: Discharge instructions given to patient/family with verbalized understanding. D/c education completed with patient/family including follow up instructions, medication list, d/c activities limitations if indicated, with other d/c instructions as indicated by MD - patient able to verbalize understanding, all questions fully answered. Patient instructed to return to ED, call 911, or call MD for any changes in condition.  Patient escorted via [place], and D/C home via private auto.  Salley Slaughter, RN 11/08/2015 3:00 PM

## 2015-11-08 NOTE — Discharge Summary (Signed)
[facility] Discharge Summary  Patient name: Miranda Fowler record number: [id number] Date of birth: 1982/08/08Age: 35 y.o.Gender: female Date of Admission: 1/20/2017Date of Discharge: 11/07/2014 Admitting Physician: Lupita Dawn, MD  Primary Care Provider: Junie Panning, DO Consultants: none  Indication for Hospitalization: Symptomatic Blood Loss Anemia  Discharge Diagnoses/Problem List:  Iron Deficiency Anemia Menorrhagia Blood Loss Anemia Hypothyroidism  Disposition: Home  Discharge Condition: Stable  Discharge Exam:  Temp: [97.8 F (36.6 C)-99 F (37.2 C)] 98.2 F (36.8 C) (01/21 0718) Pulse Rate: [55-68] 57 (01/21 0718) Resp: [16-23] 16 (01/21 0718) BP: (98-127)/(55-85) 108/80 mmHg (01/21 0718) SpO2: [100 %] 100 % (01/21 0718) Weight: [162 lb 3.2 oz (73.573 kg)] 162 lb 3.2 oz (73.573 kg) (01/20 1750) Physical Exam: General: NAD, resting comfortably in bed.  Cardiovascular: RRR, very soft SEM, 2+ distal pulses, Cap refill < 3 sec.  Respiratory: CTA Bilaterally, appropriate rate, unlabored.  Abdomen: S, NT, ND, +BS.  Extremities: WWP, 2+ distal pulses. Nail spooning noted.   Brief Hospital Course:  Pt. Is a 35 y/o AAF with hx of menorrhagia and severe hypothyroidism who presented to clinic with symptomatic anemia due to heavy menstrual period. This has been going on for some time and she has been followed by Heme (Dr. Alen Blew) in the past who were prescribing her IV Iron monthly for her anemia. She has anemia of iron deficiency due to monthly blood loss 2/2 menorrhagia. She has no clear inherited disorder, though it is unclear whether this workup has been performed yet. She has had a uterine ultrasound in the past with one small fibroid. She was supposed to follow up with OB/GYN for surgical evaluation / possible hysterectomy, but she lost her insurance and has not seen anyone in the past  year. She has also not been taking her Synthroid over the past year either. She was admitted for symptomatic anemia with Hgb of 6.0 on lab evaluation here. She was transfused 3 units of blood which she tolerated well. We performed Echocardiogram prior to discharge for further cardiac evaluation given her chronic severe anemia and hypothyroidism. She was given Depo Provera here in the hospital to reduce the bleeding in her periods while she is followed up as an outpatient and a definitive plan for treatment is determined. She was no longer symptomatic on day of discharge, and safe for discharge to home.   Issues for Follow Up:  1. Hemoglobin / Ongoing bleeding 2. Needs coagulopathy workup / referral to Heme possibly.  3. Referral to OB/GYN for surgical evaluation 4. Titrate up Synthroid given her markedly elevated TSH. She says she has been as high as 200 on Synthroid in the past. 5. Echo read.  Completed but not read prior to discharge  Significant Procedures: None.   Significant Labs and Imaging:   Last Labs      Recent Labs Lab 11/03/15 0924 11/07/15 1555 11/07/15 1950 11/08/15 1020  WBC 3.9* --  4.5 5.9  HGB 6.5* 5.5* 6.0* 10.9*  HCT 23.3* --  21.5* 34.4*  PLT 275 --  289 230      Last Labs      Recent Labs Lab 11/07/15 1950 11/08/15 1020  NA 140 140  K 3.7 3.9  CL 106 106  CO2 26 25  GLUCOSE 90 92  BUN 11 11  CREATININE 1.06* 1.18*  CALCIUM 9.3 8.7*  ALKPHOS 34* --   AST 25 --   ALT 14 --   ALBUMIN 3.6 --  Results/Tests Pending at Time of Discharge: None.   Discharge Medications:    Medication List    TAKE these medications       cetirizine 10 MG tablet  Commonly known as: ZYRTEC ALLERGY  Take 1 tablet (10 mg total) by mouth daily.     levothyroxine 125 MCG tablet  Commonly known as: SYNTHROID, LEVOTHROID  Take 125 mcg by mouth daily before  breakfast.     meloxicam 15 MG tablet  Commonly known as: MOBIC  Take 1 tablet (15 mg total) by mouth daily as needed for pain.        Discharge Instructions: Please refer to Patient Instructions section of EMR for full details. Patient was counseled important signs and symptoms that should prompt return to medical care, changes in medications, dietary instructions, activity restrictions, and follow up appointments.   Follow-Up Appointments: Follow-up Information    Follow up with [facility], DO. Go on 11/14/2015.   Specialty: Family Medicine   Why: Hospital Follow Up - Further Lab Testing and Referral.    Contact information:   [id number] [address] [phone]       Aquilla Hacker, MD 11/08/2015, 12:24 PM PGY-2, [place]

## 2015-11-09 LAB — TYPE AND SCREEN
ABO/RH(D): B POS
ANTIBODY SCREEN: NEGATIVE
Unit division: 0
Unit division: 0
Unit division: 0

## 2015-11-10 ENCOUNTER — Telehealth: Payer: Self-pay | Admitting: *Deleted

## 2015-11-10 ENCOUNTER — Telehealth: Payer: Self-pay | Admitting: Oncology

## 2015-11-10 NOTE — Telephone Encounter (Signed)
[facility] MESS REGADING NEW PT REFERRAL

## 2015-11-10 NOTE — Telephone Encounter (Signed)
Patient called stating she has been having a headache, dizziness and nausea since she left the hospital on Saturday.  Per patient she received 3 Units of blood and a Depo Provera injection for her menstrual cycle.  Patient has tried Ibuprofen and Goodies power with no relief.  Per Dr. Gerlean Ren patient should be seen.  Offered an appointment for the AM; however patient does not get off work until 3:30 PM.  Appointment for tomorrow at 4:15 PM with Dr. Lonny Prude.  Derl Barrow, RN

## 2015-11-11 ENCOUNTER — Ambulatory Visit (INDEPENDENT_AMBULATORY_CARE_PROVIDER_SITE_OTHER): Payer: BLUE CROSS/BLUE SHIELD | Admitting: Family Medicine

## 2015-11-11 ENCOUNTER — Encounter: Payer: Self-pay | Admitting: Family Medicine

## 2015-11-11 VITALS — BP 130/85 | HR 62 | Temp 98.5°F | Ht 64.0 in | Wt 162.6 lb

## 2015-11-11 DIAGNOSIS — R519 Headache, unspecified: Secondary | ICD-10-CM

## 2015-11-11 DIAGNOSIS — R51 Headache: Secondary | ICD-10-CM

## 2015-11-11 MED ORDER — IBUPROFEN 800 MG PO TABS: 800.0000 mg | ORAL_TABLET | Freq: Three times a day (TID) | ORAL | Status: DC | PRN

## 2015-11-11 MED ORDER — ONDANSETRON HCL 4 MG PO TABS: 4.0000 mg | ORAL_TABLET | Freq: Three times a day (TID) | ORAL | Status: DC | PRN

## 2015-11-11 NOTE — Progress Notes (Signed)
    Subjective   Miranda Fowler is a 35 y.o. female that presents for a same day visit  1. Headache: Symptoms started three days ago. She reports having received a blood transfusion and the depo-provera injection four days. Headache was located frontal, achy and non-radiating. She had some photosensitivity. She took ibuprofen which did not help. She took Mobic last night, which helped with her symptoms. Symptoms today have improved greatly. She still has some nausea. No fevers. She reports a history of migraine about 15 years ago that have not recurred. She has a sister that has bad migraines.   ROS Per HPI  Social History  Substance Use Topics  . Smoking status: Never Smoker   . Smokeless tobacco: None  . Alcohol Use: No    No Known Allergies  Objective   BP 130/85 mmHg  Pulse 62  Temp(Src) 98.5 F (36.9 C) (Oral)  Ht 5\' 4"  (1.626 m)  Wt 162 lb 9.6 oz (73.755 kg)  BMI 27.90 kg/m2  LMP 11/06/2015  General: Well appearing, no distress HEENT:   Head:  Normocephalic, no scalp tenderness  Eyes: Pupils equal and reactive to light/accomodation. Extraocular movements intact bilaterally. Patient has some photosensitivity. Conjunctiva normal  Assessment and Plan   Meds ordered this encounter  Medications  . ondansetron (ZOFRAN) 4 MG tablet    Sig: Take 1 tablet (4 mg total) by mouth every 8 (eight) hours as needed for nausea or vomiting.    Dispense:  20 tablet    Refill:  0  . ibuprofen (ADVIL,MOTRIN) 800 MG tablet    Sig: Take 1 tablet (800 mg total) by mouth every 8 (eight) hours as needed.    Dispense:  30 tablet    Refill:  0    Headache: likely migrainous. Patient recently increased to synthroid 129mcg for elevated TSH. Symptoms possibly related to hypothyroid. Do not suspect hyperthyroid disease. - zofran for nausea - ibuprofen 800mg  q8hrs prn headache - advised not to use mobic and ibuprofen - follow-up if symptoms fail to improve

## 2015-11-11 NOTE — Patient Instructions (Signed)
Thank you for coming to see me today. It was a pleasure. Today we talked about:   Headache: This sounds like a migraine. I will prescribe ibuprofen to help with your pain and Zofran to help with the nausea. If symptoms do not improve, please call back  Please make an appointment to see Dr Gerlean Ren for your scheduled follow-up  If you have any questions or concerns, please do not hesitate to call the office at [phone].  Sincerely,  Cordelia Poche, MD

## 2015-11-13 ENCOUNTER — Telehealth: Payer: Self-pay | Admitting: Oncology

## 2015-11-13 NOTE — Telephone Encounter (Signed)
Lt mess for pt regarding new pt referral  °

## 2015-11-14 ENCOUNTER — Ambulatory Visit (INDEPENDENT_AMBULATORY_CARE_PROVIDER_SITE_OTHER): Payer: BLUE CROSS/BLUE SHIELD | Admitting: Family Medicine

## 2015-11-14 ENCOUNTER — Encounter: Payer: Self-pay | Admitting: Family Medicine

## 2015-11-14 VITALS — BP 112/90 | HR 67 | Temp 98.3°F | Wt 163.0 lb

## 2015-11-14 DIAGNOSIS — E038 Other specified hypothyroidism: Secondary | ICD-10-CM | POA: Diagnosis not present

## 2015-11-14 DIAGNOSIS — N920 Excessive and frequent menstruation with regular cycle: Secondary | ICD-10-CM | POA: Diagnosis not present

## 2015-11-14 DIAGNOSIS — D500 Iron deficiency anemia secondary to blood loss (chronic): Secondary | ICD-10-CM

## 2015-11-14 NOTE — Patient Instructions (Signed)
Thank you so much for coming to visit me today! We will check your blood levels today. I have placed a referral to gynecology. They should contact you to set up an appointment. Please contact Hematology at [phone] to set up an appointment so we can prevent you from getting so anemic again. Please follow up in 6-7 weeks so we can recheck your thyroid level and adjust your medication.  Thanks again! Dr. Gerlean Ren

## 2015-11-15 LAB — CBC WITH DIFFERENTIAL/PLATELET
BASOS PCT: 0 % (ref 0–1)
Basophils Absolute: 0 10*3/uL (ref 0.0–0.1)
Eosinophils Absolute: 0 10*3/uL (ref 0.0–0.7)
Eosinophils Relative: 0 % (ref 0–5)
HEMATOCRIT: 34.7 % — AB (ref 36.0–46.0)
HEMOGLOBIN: 10.8 g/dL — AB (ref 12.0–15.0)
LYMPHS PCT: 35 % (ref 12–46)
Lymphs Abs: 2.1 10*3/uL (ref 0.7–4.0)
MCH: 25.2 pg — AB (ref 26.0–34.0)
MCHC: 31.1 g/dL (ref 30.0–36.0)
MCV: 80.9 fL (ref 78.0–100.0)
MONO ABS: 0.9 10*3/uL (ref 0.1–1.0)
MONOS PCT: 14 % — AB (ref 3–12)
MPV: 9.2 fL (ref 8.6–12.4)
NEUTROS ABS: 3.1 10*3/uL (ref 1.7–7.7)
Neutrophils Relative %: 51 % (ref 43–77)
Platelets: 484 10*3/uL — ABNORMAL HIGH (ref 150–400)
RBC: 4.29 MIL/uL (ref 3.87–5.11)
RDW: 23.3 % — ABNORMAL HIGH (ref 11.5–15.5)
WBC: 6.1 10*3/uL (ref 4.0–10.5)

## 2015-11-15 NOTE — Assessment & Plan Note (Signed)
-   Follow up in 6-7 weeks to recheck TSH. Will adjust medication at that visit.

## 2015-11-15 NOTE — Assessment & Plan Note (Addendum)
-   Improved. Denies symptoms. - Recheck CBC. Will hold off on further coagulation workup since this has been done by Hematology in past and patient is unsure what labs have already been done. Defer to Hematology. - Contact number for Hematology given. Instructed to call and schedule appointment to restart iron infusions and prevent further hospitalizations. - Referral to Ob/Gyn to discuss menorrhagia and revisit possibility of hysterectomy given significant anemia. - Return if symptoms worsen.

## 2015-11-15 NOTE — Progress Notes (Signed)
Subjective:     Patient ID: Miranda Fowler, female   DOB: 09/11/1981, 35 y.o.   MRN: [id number]  HPI [name] is a 35yo female presenting today for hospital follow up. - Summary of Hospitalization: Noted at lab follow up at clinic on 11/07/2015 to have Hemoglobin 5.5 with significant menorrhagia and symptoms including shortness of breath, dizziness, and fatigue. Admitted directly to inpatient teaching service. Transfused three units of blood during hospitalization. Echocardiogram with EF 45-50% and slightly decreased systolic function. Discharged on 1/21 with hemoglobin of 10.9 and improvement of symptoms. - Continues to deny symptoms of anemia, including shortness of breath, dizziness, fatigue, chest pain - Denies lower extremity edema and orthopnea. Reports occasionally waking up gasping for breath, but believes it is always associated with a bad dream and does not normally occur otherwise. - LMP 1/19-1/22. Seemed to be lighter than previous, using only 4 pads per day. Normally used 1 large pack of pads in two months. Notes improvement with Depo.  - Reports history of fibroid back in 2008 with plan by Ob/Gyn for hysterectomy, however her insurance would not pay for it. Note that pelvic US in 2011 did not show fibroids. Would like referral to Ob/Gyn. - States she did not respond to Hematology phone calls because she was at work when they called. Was unsure if she still needed to see them since she was feeling better. - Unsure of lab workup already done by Hematology in past. - No further concerns today.  - Denies history of smoking.  Review of Systems Per HPI. Other systems negative.    Objective:   Physical Exam  Constitutional: She appears well-developed and well-nourished. No distress.  HENT:  Head: Normocephalic and atraumatic.  Eyes:  Pink conjunctiva noted  Cardiovascular: Normal rate and regular rhythm.  Exam reveals no gallop and no friction rub.   No murmur  heard. Pulmonary/Chest: Effort normal. No respiratory distress. She has no wheezes.  Abdominal: Soft. She exhibits no distension. There is no tenderness.  Musculoskeletal: She exhibits no edema.  Psychiatric: She has a normal mood and affect. Her behavior is normal.       Assessment and Plan:     Hypothyroidism - Follow up in 6-7 weeks to recheck TSH. Will adjust medication at that visit.  Anemia - Improved. Denies symptoms. - Recheck CBC. Will hold off on further coagulation workup since this has been done by Hematology in past and patient is unsure what labs have already been done. Defer to Hematology. - Contact number for Hematology given. Instructed to call and schedule appointment to restart iron infusions and prevent further hospitalizations. - Referral to Ob/Gyn to discuss menorrhagia and revisit possibility of hysterectomy given significant anemia. - Return if symptoms worsen.

## 2015-11-20 ENCOUNTER — Telehealth: Payer: Self-pay | Admitting: Oncology

## 2015-11-20 NOTE — Telephone Encounter (Signed)
PT CALLED IN DUE MISSING HER LAST F/U.  SHE HAD A TRANSFUSION AND UNABLE TO KEEP APPT.  CONFIRMED 2/22 AT 4

## 2015-11-21 ENCOUNTER — Telehealth: Payer: Self-pay | Admitting: Family Medicine

## 2015-11-21 NOTE — Telephone Encounter (Signed)
Please let Miranda Fowler know her labs are stable without worsening anemia.

## 2015-11-26 NOTE — Telephone Encounter (Signed)
LMOVM for pt to return call. Evonna Stoltz Dawn, CMA  

## 2015-11-26 NOTE — Telephone Encounter (Signed)
Pt returned call.  Informed of the below. Miranda Fowler, [name]

## 2015-12-10 ENCOUNTER — Ambulatory Visit (HOSPITAL_BASED_OUTPATIENT_CLINIC_OR_DEPARTMENT_OTHER): Payer: BLUE CROSS/BLUE SHIELD | Admitting: Oncology

## 2015-12-10 ENCOUNTER — Telehealth: Payer: Self-pay | Admitting: Oncology

## 2015-12-10 ENCOUNTER — Telehealth: Payer: Self-pay | Admitting: *Deleted

## 2015-12-10 VITALS — BP 122/72 | HR 74 | Temp 98.2°F | Resp 18 | Ht 64.0 in | Wt 160.3 lb

## 2015-12-10 DIAGNOSIS — D509 Iron deficiency anemia, unspecified: Secondary | ICD-10-CM

## 2015-12-10 DIAGNOSIS — N920 Excessive and frequent menstruation with regular cycle: Secondary | ICD-10-CM

## 2015-12-10 NOTE — Telephone Encounter (Signed)
Per staff message and POF I have scheduled appts. Advised scheduler of appts. [name]  

## 2015-12-10 NOTE — Progress Notes (Signed)
Hematology and Oncology Follow Up Visit  Miranda Fowler [id number] 1981-01-02 35 y.o. 12/10/2015 4:26 PM  CC: Miranda Crafts, DO    Principle Diagnosis:  A 35 year old female with iron-deficiency anemia diagnosed in August 2009 who presented with hemoglobin of 7.2, ferritin of 3. Her iron deficiency is related due to menorrhagia.  Prior Therapy: Patient was treated with iron replacement on June 14, 2008, normalization of her iron study. She had repeat IV iron infusion on multiple occasions the most recent of which in 2015.  Current therapy: Oral iron therapy which she have not taken consistently because of poor tolerance.  Interim History: [name] presents today for a followup visit.  Very pleasant woman have not been seen in clinic for the last 2 years that have been evaluated in the past because of recurrent iron deficiency. She presented to her primary care provider and found to have a hemoglobin of 5.5 in January 2017. She was hospitalized briefly and received packed red cell transfusions. During her hospitalization her hemoglobin improved to 10.8. Iron studies were obtained on 11/07/2015 showed an iron level of 8, saturation of 7% and ferritin of less than 3%.  Upon her discharge, she has been doing fairly well. She received a Depo-Provera injection which have decreased her menses at this time. She does not report any difficulty breathing or dyspnea on exertion. She had reported some fatigue and lower extremity pain which has improved. She resumed all her work-related duties without any decline.   She does not report any headaches, blurry vision, syncope or seizures. She does not report any fevers, chills or sweats. She does not report any cough, wheezing or hemoptysis. Does not report any nausea, vomiting or abdominal pain. She does not report any hematochezia, melena or hematemesis. She does not report any frequency urgency or hesitancy. She does not report any skeletal  complaints. Remaining review of systems unremarkable.  Medications: I have reviewed the patient's current medications.  Current outpatient prescriptions:  .  cetirizine (ZYRTEC ALLERGY) 10 MG tablet, Take 1 tablet (10 mg total) by mouth daily., Disp: 30 tablet, Rfl: 5 .  ibuprofen (ADVIL,MOTRIN) 800 MG tablet, Take 1 tablet (800 mg total) by mouth every 8 (eight) hours as needed., Disp: 30 tablet, Rfl: 0 .  levothyroxine (SYNTHROID, LEVOTHROID) 125 MCG tablet, Take 125 mcg by mouth daily before breakfast., Disp: , Rfl:  .  meloxicam (MOBIC) 15 MG tablet, Take 1 tablet (15 mg total) by mouth daily as needed for pain., Disp: 30 tablet, Rfl: 3 .  ondansetron (ZOFRAN) 4 MG tablet, Take 1 tablet (4 mg total) by mouth every 8 (eight) hours as needed for nausea or vomiting., Disp: 20 tablet, Rfl: 0  Allergies: No Known Allergies  Past Medical History, Surgical history, Social history, and Family History were reviewed and updated.   Physical Exam: Blood pressure 122/72, pulse 74, temperature 98.2 F (36.8 C), temperature source Oral, resp. rate 18, height 5\' 4"  (1.626 m), weight 160 lb 4.8 oz (72.712 kg), last menstrual period 11/06/2015, SpO2 100 %. ECOG: 0 General appearance: alert: Without distress. Head: Normocephalic, without obvious abnormality Neck: no adenopathy Lymph nodes: Cervical, supraclavicular, and axillary nodes normal. Heart:regular rate and rhythm, S1, S2 normal, no murmur, click, rub or gallop Lung:chest clear, no wheezing, rales, normal symmetric air entry Abdomen: soft, non-tender, without masses or organomegaly no shifting dullness or ascites. EXT:no erythema, induration, or nodules   Lab Results: Lab Results  Component Value Date   WBC 6.1 11/14/2015  HGB 10.8* 11/14/2015   HCT 34.7* 11/14/2015   MCV 80.9 11/14/2015   PLT 484* 11/14/2015     Chemistry      Component Value Date/Time   NA 140 11/08/2015 1020   NA 140 11/02/2013 1336   K 3.9 11/08/2015 1020    K 3.8 11/02/2013 1336   CL 106 11/08/2015 1020   CL 108* 12/14/2012 1342   CO2 25 11/08/2015 1020   CO2 27 11/02/2013 1336   BUN 11 11/08/2015 1020   BUN 9.1 11/02/2013 1336   CREATININE 1.18* 11/08/2015 1020   CREATININE 0.8 11/02/2013 1336   CREATININE 1.23* 07/20/2011 1121      Component Value Date/Time   CALCIUM 8.7* 11/08/2015 1020   CALCIUM 8.9 11/02/2013 1336   ALKPHOS 34* 11/07/2015 1950   ALKPHOS 47 11/02/2013 1336   AST 25 11/07/2015 1950   AST 16 11/02/2013 1336   ALT 14 11/07/2015 1950   ALT 8 11/02/2013 1336   BILITOT 0.4 11/07/2015 1950   BILITOT 0.36 11/02/2013 1336      Impression and Plan:  A 35 year old female with the following issues:   1. Iron-deficiency anemia. This is likely related to have menorrhagia dating back to 2009. She had a repeated IV iron infusion in the past and poor tolerance to oral iron. Her most recent iron studies in January 2017 continue to be profoundly low. I have recommended total iron replacement with a total of 1 g of Feraheme. Risks and benefits of this infusion was discussed again today she is agreeable to proceed. I will repeat iron studies in 3 months and supplement intravenously as needed. 2. Menorrhagia. She received Depo-Provera to decrease her menses which have helped at this time.    3. Follow-up. In 3 months.  [facility] 2/22/20174:26 PM

## 2015-12-10 NOTE — Telephone Encounter (Signed)
per pof to sch pt appt-gave pt opy of avs °

## 2015-12-10 NOTE — Telephone Encounter (Signed)
MW sch fera-gave pt copy of avs

## 2015-12-12 ENCOUNTER — Ambulatory Visit (HOSPITAL_BASED_OUTPATIENT_CLINIC_OR_DEPARTMENT_OTHER): Payer: BLUE CROSS/BLUE SHIELD

## 2015-12-12 ENCOUNTER — Encounter: Payer: Self-pay | Admitting: *Deleted

## 2015-12-12 VITALS — BP 122/71 | HR 67 | Temp 98.6°F | Resp 18

## 2015-12-12 DIAGNOSIS — D509 Iron deficiency anemia, unspecified: Secondary | ICD-10-CM | POA: Diagnosis not present

## 2015-12-12 MED ORDER — FERUMOXYTOL INJECTION 510 MG/17 ML
510.0000 mg | Freq: Once | INTRAVENOUS | Status: AC
  Administered 2015-12-12: 510 mg via INTRAVENOUS
  Filled 2015-12-12: qty 17

## 2015-12-12 MED ORDER — SODIUM CHLORIDE 0.9 % IV SOLN
Freq: Once | INTRAVENOUS | Status: AC
  Administered 2015-12-12: 09:00:00 via INTRAVENOUS

## 2015-12-12 NOTE — Patient Instructions (Signed)

## 2015-12-17 ENCOUNTER — Encounter: Payer: Self-pay | Admitting: Women's Health

## 2015-12-17 ENCOUNTER — Ambulatory Visit: Payer: Self-pay | Admitting: Gynecology

## 2015-12-19 ENCOUNTER — Ambulatory Visit (HOSPITAL_BASED_OUTPATIENT_CLINIC_OR_DEPARTMENT_OTHER): Payer: BLUE CROSS/BLUE SHIELD

## 2015-12-19 VITALS — BP 117/78 | HR 110 | Temp 97.8°F | Resp 18

## 2015-12-19 DIAGNOSIS — D509 Iron deficiency anemia, unspecified: Secondary | ICD-10-CM | POA: Diagnosis not present

## 2015-12-19 MED ORDER — SODIUM CHLORIDE 0.9 % IV SOLN
510.0000 mg | Freq: Once | INTRAVENOUS | Status: AC
  Administered 2015-12-19: 510 mg via INTRAVENOUS
  Filled 2015-12-19: qty 17

## 2015-12-19 MED ORDER — SODIUM CHLORIDE 0.9 % IV SOLN
Freq: Once | INTRAVENOUS | Status: AC
  Administered 2015-12-19: 09:00:00 via INTRAVENOUS

## 2015-12-19 NOTE — Patient Instructions (Signed)

## 2015-12-19 NOTE — Progress Notes (Signed)
Patient observed for 30 min post Feraheme infusion. No complications.

## 2016-01-02 ENCOUNTER — Ambulatory Visit (INDEPENDENT_AMBULATORY_CARE_PROVIDER_SITE_OTHER): Payer: BLUE CROSS/BLUE SHIELD | Admitting: Women's Health

## 2016-01-02 ENCOUNTER — Encounter: Payer: Self-pay | Admitting: Women's Health

## 2016-01-02 VITALS — BP 110/80 | Ht 64.0 in | Wt 162.0 lb

## 2016-01-02 DIAGNOSIS — Z01419 Encounter for gynecological examination (general) (routine) without abnormal findings: Secondary | ICD-10-CM

## 2016-01-02 DIAGNOSIS — N920 Excessive and frequent menstruation with regular cycle: Secondary | ICD-10-CM | POA: Diagnosis not present

## 2016-01-02 DIAGNOSIS — D500 Iron deficiency anemia secondary to blood loss (chronic): Secondary | ICD-10-CM | POA: Diagnosis not present

## 2016-01-02 DIAGNOSIS — Z30019 Encounter for initial prescription of contraceptives, unspecified: Secondary | ICD-10-CM

## 2016-01-02 MED ORDER — MEDROXYPROGESTERONE ACETATE 150 MG/ML IM SUSP: 150.0000 mg | INTRAMUSCULAR | Status: DC

## 2016-01-02 NOTE — Patient Instructions (Signed)
HPV (Human Papillomavirus) Vaccine--Gardasil-9:  1. Why get vaccinated? Gardasil-9 prevents human papillomavirus (HPV) types that cause many cancers, including:  cervical cancer in females,  vaginal and vulvar cancers in females,  anal cancer in females and males,  throat cancer in females and males, and  penile cancer in males. In addition, Gardasil-9 prevents HPV types that cause genital warts in both females and males. In the [place] about 12,000 women get cervical cancer every year, and about 4,000 women die from it. Gardasil-9 can prevent most of these cases of cervical cancer. Vaccination is not a substitute for cervical cancer screening. This vaccine does not protect against all HPV types that can cause cervical cancer. Women should still get regular Pap tests. HPV infection usually comes from sexual contact, and most people will become infected at some point in their life. About 14 million Americans, including teens, get infected every year. Most infections will go away and not cause serious problems. But thousands of women and men get cancer and diseases from HPV. 2. HPV vaccine Gardasil-9 is an FDA-approved HPV vaccine. It is recommended for both males and females. It is routinely given at 11 or 35 years of age, but it may be given beginning at age 9 years through age 26 years. Three doses of Gardasil-9 are recommended with the second dose given 1-2 months after the first dose and the third dose given 6 months after the first dose. 3. Some people should not get this vaccine  Anyone who has had a severe, life-threatening allergic reaction to a dose of HPV vaccine should not get another dose.  Anyone who has a severe (life threatening) allergy to any component of HPV vaccine should not get the vaccine. Tell your doctor if you have any severe allergies that you know of, including a severe allergy to yeast.  HPV vaccine is not recommended for pregnant women. If you learn that you were  pregnant when you were vaccinated, there is no reason to expect any problems for you or your baby. Any woman who learns she was pregnant when she got Gardasil-9 vaccine is encouraged to contact the manufacturer's registry for HPV vaccination during pregnancy at 1-800-986-8999. Women who are breastfeeding may be vaccinated.  If you have a mild illness, such as a cold, you can probably get the vaccine today. If you are moderately or severely ill, you should probably wait until you recover. Your doctor can advise you. 4. Risks of a vaccine reaction With any medicine, including vaccines, there is a chance of side effects. These are usually mild and go away on their own, but serious reactions are also possible. Most people who get HPV vaccine do not have any serious problems with it. Mild or moderate problems following Gardasil-9:  Reactions in the arm where the shot was given:  Soreness (about 9 people in 10)  Redness or swelling (about 1 person in 3)  Fever:  Mild (100F) (about 1 person in 10)  Moderate (102F) (about 1 person in 65)  Other problems:  Headache (about 1 person in 3) Problems that could happen after any injected vaccine:  People sometimes faint after a medical procedure, including vaccination. Sitting or lying down for about 15 minutes can help prevent fainting, and injuries caused by a fall. Tell your doctor if you feel dizzy, or have vision changes or ringing in the ears.  Some people get severe pain in the shoulder and have difficulty moving the arm where a shot was given. This happens   very rarely.  Any medication can cause a severe allergic reaction. Such reactions from a vaccine are very rare, estimated at about 1 in a million doses, and would happen within a few minutes to a few hours after the vaccination. As with any medicine, there is a very remote chance of a vaccine causing a serious injury or death. The safety of vaccines is always being monitored. For more  information, visit: http://www.aguilar.org/. 5. What if there is a serious reaction? What should I look for? Look for anything that concerns you, such as signs of a severe allergic reaction, very high fever, or unusual behavior. Signs of a severe allergic reaction can include hives, swelling of the face and throat, difficulty breathing, a fast heartbeat, dizziness, and weakness. These would usually start a few minutes to a few hours after the vaccination. What should I do? If you think it is a severe allergic reaction or other emergency that can't wait, call 9-1-1 or get to the nearest hospital. Otherwise, call your doctor. Afterward, the reaction should be reported to the "Vaccine Adverse Event Reporting System" (VAERS). Your doctor might file this report, or you can do it yourself through the VAERS web site at www.vaers.SamedayNews.es, or by calling 775-755-6542. VAERS does not give medical advice. 6. The National Vaccine Injury Compensation Program The [employer] Vaccine Injury Compensation Program (VICP) is a federal program that was created to compensate people who may have been injured by certain vaccines. Persons who believe they may have been injured by a vaccine can learn about the program and about filing a claim by calling (630)203-0098 or visiting the [employer] website at GoldCloset.com.ee. There is a time limit to file a claim for compensation. 7. How can I learn more?  Ask your health care provider. He or she can give you the vaccine package insert or suggest other sources of information.  Call your local or state health department.  Contact the Centers for Disease Control and Prevention (CDC):  Call (316)346-8028 (1-800-CDC-INFO) or  Visit CDC's website at http://sweeney-todd.com/ Vaccine Information Statement HPV Vaccine Gaylyn Lambert) 01/16/15   This information is not intended to replace advice given to you by your health care provider. Make sure you discuss any questions you  have with your health care provider.   Document Released: 05/01/2014 Document Revised: 02/18/2015 Document Reviewed: 05/01/2014 Elsevier Interactive Patient Education 2016 [employer]. Iron Deficiency Anemia, Adult Anemia is a condition in which there are less red blood cells or hemoglobin in the blood than normal. Hemoglobin is the part of red blood cells that carries oxygen. Iron deficiency anemia is anemia caused by too little iron. It is the most common type of anemia. It may leave you tired and short of breath. CAUSES   Lack of iron in the diet.  Poor absorption of iron, as seen with intestinal disorders.  Intestinal bleeding.  Heavy periods. SIGNS AND SYMPTOMS  Mild anemia may not be noticeable. Symptoms may include:  Fatigue.  Headache.  Pale skin.  Weakness.  Tiredness.  Shortness of breath.  Dizziness.  Cold hands and feet.  Fast or irregular heartbeat. DIAGNOSIS  Diagnosis requires a thorough evaluation and physical exam by your health care provider. Blood tests are generally used to confirm iron deficiency anemia. Additional tests may be done to find the underlying cause of your anemia. These may include:  Testing for blood in the stool (fecal occult blood test).  A procedure to see inside the colon and rectum (colonoscopy).  A procedure to see  inside the esophagus and stomach (endoscopy). TREATMENT  Iron deficiency anemia is treated by correcting the cause of the deficiency. Treatment may involve:  Adding iron-rich foods to your diet.  Taking iron supplements. Pregnant or breastfeeding women need to take extra iron because their normal diet usually does not provide the required amount.  Taking vitamins. Vitamin C improves the absorption of iron. Your health care provider may recommend that you take your iron tablets with a glass of orange juice or vitamin C supplement.  Medicines to make heavy menstrual flow lighter.  Surgery. HOME CARE  INSTRUCTIONS   Take iron as directed by your health care provider.  If you cannot tolerate taking iron supplements by mouth, talk to your health care provider about taking them through a vein (intravenously) or an injection into a muscle.  For the best iron absorption, iron supplements should be taken on an empty stomach. If you cannot tolerate them on an empty stomach, you may need to take them with food.  Do not drink milk or take antacids at the same time as your iron supplements. Milk and antacids may interfere with the absorption of iron.  Iron supplements can cause constipation. Make sure to include fiber in your diet to prevent constipation. A stool softener may also be recommended.  Take vitamins as directed by your health care provider.  Eat a diet rich in iron. Foods high in iron include liver, lean beef, whole-grain bread, eggs, dried fruit, and dark green leafy vegetables. SEEK IMMEDIATE MEDICAL CARE IF:   You faint. If this happens, do not drive. Call your local emergency services (911 in [place] if no other help is available.  You have chest pain.  You feel nauseous or vomit.  You have severe or increased shortness of breath with activity.  You feel weak.  You have a rapid heartbeat.  You have unexplained sweating.  You become light-headed when getting up from a chair or bed. MAKE SURE YOU:   Understand these instructions.  Will watch your condition.  Will get help right away if you are not doing well or get worse.   This information is not intended to replace advice given to you by your health care provider. Make sure you discuss any questions you have with your health care provider.   Document Released: 10/01/2000 Document Revised: 10/25/2014 Document Reviewed: 06/11/2013 Elsevier Interactive Patient Education 2016 [employer]. Hysterectomy Information  A hysterectomy is a surgery in which your uterus is removed. This surgery may be done to treat various  medical problems. After the surgery, you will no longer have menstrual periods. The surgery will also make you unable to become pregnant (sterile). The fallopian tubes and ovaries can be removed (bilateral salpingo-oophorectomy) during this surgery as well.  REASONS FOR A HYSTERECTOMY  Persistent, abnormal bleeding.  Lasting (chronic) pelvic pain or infection.  The lining of the uterus (endometrium) starts growing outside the uterus (endometriosis).  The endometrium starts growing in the muscle of the uterus (adenomyosis).  The uterus falls down into the vagina (pelvic organ prolapse).  Noncancerous growths in the uterus (uterine fibroids) that cause symptoms.  Precancerous cells.  Cervical cancer or uterine cancer. TYPES OF HYSTERECTOMIES  Supracervical hysterectomy--In this type, the top part of the uterus is removed, but not the cervix.  Total hysterectomy--The uterus and cervix are removed.  Radical hysterectomy--The uterus, the cervix, and the fibrous tissue that holds the uterus in place in the pelvis (parametrium) are removed. WAYS A HYSTERECTOMY CAN BE  PERFORMED  Abdominal hysterectomy--A large surgical cut (incision) is made in the abdomen. The uterus is removed through this incision.  Vaginal hysterectomy--An incision is made in the vagina. The uterus is removed through this incision. There are no abdominal incisions.  Conventional laparoscopic hysterectomy--Three or four small incisions are made in the abdomen. A thin, lighted tube with a camera (laparoscope) is inserted into one of the incisions. Other tools are put through the other incisions. The uterus is cut into small pieces. The small pieces are removed through the incisions, or they are removed through the vagina.  Laparoscopically assisted vaginal hysterectomy (LAVH)--Three or four small incisions are made in the abdomen. Part of the surgery is performed laparoscopically and part vaginally. The uterus is removed  through the vagina.  Robot-assisted laparoscopic hysterectomy--A laparoscope and other tools are inserted into 3 or 4 small incisions in the abdomen. A computer-controlled device is used to give the surgeon a 3D image and to help control the surgical instruments. This allows for more precise movements of surgical instruments. The uterus is cut into small pieces and removed through the incisions or removed through the vagina. RISKS AND COMPLICATIONS  Possible complications associated with this procedure include:  Bleeding and risk of blood transfusion. Tell your health care provider if you do not want to receive any blood products.  Blood clots in the legs or lung.  Infection.  Injury to surrounding organs.  Problems or side effects related to anesthesia.  Conversion to an abdominal hysterectomy from one of the other techniques. WHAT TO EXPECT AFTER A HYSTERECTOMY  You will be given pain medicine.  You will need to have someone with you for the first 3-5 days after you go home.  You will need to follow up with your surgeon in 2-4 weeks after surgery to evaluate your progress.  You may have early menopause symptoms such as hot flashes, night sweats, and insomnia.  If you had a hysterectomy for a problem that was not cancer or not a condition that could lead to cancer, then you no longer need Pap tests. However, even if you no longer need a Pap test, a regular exam is a good idea to make sure no other problems are starting.   This information is not intended to replace advice given to you by your health care provider. Make sure you discuss any questions you have with your health care provider.   Document Released: 03/30/2001 Document Revised: 07/25/2013 Document Reviewed: 06/11/2013 Elsevier Interactive Patient Education [employer].

## 2016-01-02 NOTE — Progress Notes (Signed)
Miranda Fowler September 15, 1981 [id number]    History:    Presents for new patient referral  requesting hysterectomy . Reports monthly cycle/BTL with chronic anemia and menorrhagia, often soaking through 6-7 tampons/pads a day and like to proceed with hysterectomy. History of transfusions, lowest Hgb 5.5 on 11/07/2015 requiring several transfusions, she states last hemoglobin was 12, we do not have that record.  Reports has often has hemoglobins of 7 over the last few years. Given Depo-Provera  on 11/07/2015, and had an extremely light period after. Gets IV iron, cannot tolerate PO.  History of hypothyroidism since age 62 on 125 mcg Synthroid, TSH 2 months ago was 50.4, improved from 88  2 months earlier. Reports normal pap history.   Past medical history, past surgical history, family history and social history were all reviewed and documented in the EPIC chart. Works in [employer]. 4 children, ages 69-15, married.  ROS:  A ROS was performed and pertinent positives and negatives are included.  Exam:  Filed Vitals:   01/02/16 1604  BP: 110/80    General appearance:  Normal Thyroid:  Symmetrical, normal in size, without palpable masses or nodularity. Respiratory  Auscultation:  Clear without wheezing or rhonchi Cardiovascular  Auscultation:  Regular rate, without rubs, murmurs or gallops  Edema/varicosities:  Not grossly evident Abdominal  Soft,nontender, without masses, guarding or rebound.  Liver/spleen:  No organomegaly noted  Hernia:  None appreciated  Skin  Inspection:  Grossly normal   Breasts: Examined lying and sitting.     Right: Without masses, retractions, discharge or axillary adenopathy.     Left: Without masses, retractions, discharge or axillary adenopathy. Gentitourinary   Inguinal/mons:  Normal without inguinal adenopathy  External genitalia:  Normal  BUS/Urethra/Skene's glands:  Normal  Vagina:  Normal  Cervix:  Normal  Uterus:  Normal in size, shape and contour.   Midline and mobile  Adnexa/parametria:     Rt: Without masses or tenderness.   Lt: Without masses or tenderness.  Anus and perineum: Normal    Assessment/Plan:  35 y.o. MBF G4P4 for annual exam with complaint of menorrhagia requesting  hysterectomy.  Iron-deficiency anemia from menorrhagia with history of transfusions Hypothyroidisn- meds and labs managed by primary care  Plan: SBEs, increasing exercise and decreasing calories, MVI daily encouraged. Discussed need for stable Hgb before proceeding with hysterectomy. Rx for Depo-provera injection given, will receive the week of April 14th. Will also have US performed at that time and meet with Dr. Toney Rakes to discuss hysterectomy. Will fax lab work. Urinalysis pending. January 2017 Pap normal with negative HR HPV typing.   Huel Cote [facility], 4:57 PM 01/02/2016

## 2016-01-02 NOTE — Progress Notes (Deleted)
Patient ID: Miranda Fowler, female   DOB: 01-17-1981, 35 y.o.   MRN: [id number] Presents with complaint of chronic menorrhagia, often soaking through 6-7 tampons/pads a day. Is interested in hysterectomy. History of transfusions, lowest Hgb 5.5 on 11/07/2015 requiring several transfusions, she is not sure what her last Hgb measured. Given Depo-Provera shot on 11/07/2015, and had an extremely light period after. History of hypothyroidism on 125 mcg Synthroid, TSH 2 months ago was 50.4, improved from   Secondary iron deficiency anemia from menorrhagia Hypothyroidism- labs and meds managed by primary care  Plan:

## 2016-01-03 LAB — URINALYSIS W MICROSCOPIC + REFLEX CULTURE
Bilirubin Urine: NEGATIVE
CASTS: NONE SEEN [LPF]
Crystals: NONE SEEN [HPF]
Glucose, UA: NEGATIVE
HGB URINE DIPSTICK: NEGATIVE
Ketones, ur: NEGATIVE
LEUKOCYTES UA: NEGATIVE
NITRITE: NEGATIVE
PH: 6 (ref 5.0–8.0)
PROTEIN: NEGATIVE
Specific Gravity, Urine: 1.031 (ref 1.001–1.035)
WBC, UA: NONE SEEN WBC/HPF (ref <=5)
YEAST: NONE SEEN [HPF]

## 2016-01-05 LAB — URINE CULTURE: Colony Count: >=100000

## 2016-01-08 ENCOUNTER — Ambulatory Visit (INDEPENDENT_AMBULATORY_CARE_PROVIDER_SITE_OTHER): Payer: BLUE CROSS/BLUE SHIELD | Admitting: Internal Medicine

## 2016-01-08 ENCOUNTER — Encounter: Payer: Self-pay | Admitting: Internal Medicine

## 2016-01-08 VITALS — BP 118/62 | HR 68 | Temp 98.2°F | Ht 64.0 in | Wt 163.6 lb

## 2016-01-08 DIAGNOSIS — R51 Headache: Secondary | ICD-10-CM

## 2016-01-08 DIAGNOSIS — M7989 Other specified soft tissue disorders: Secondary | ICD-10-CM | POA: Insufficient documentation

## 2016-01-08 DIAGNOSIS — R519 Headache, unspecified: Secondary | ICD-10-CM

## 2016-01-08 MED ORDER — IBUPROFEN 600 MG PO TABS: 600.0000 mg | ORAL_TABLET | Freq: Three times a day (TID) | ORAL | Status: DC | PRN

## 2016-01-08 NOTE — Patient Instructions (Addendum)
Please take Ibuprofen for pain. If not improved in the next 5 days, I will send in a referral to sports medicine

## 2016-01-08 NOTE — Assessment & Plan Note (Signed)
2 by 3 cm circular lesion of the dorsal surface of left foot. Non-infectious looking. Subcutaneous tissue swelling on unknown origin. Possibly a lipoma  - Continue ibuprofen for the next 5 days - If no improvement in 5 days will refer to sports medicine

## 2016-01-08 NOTE — Progress Notes (Signed)
   [facility] Kerrin Mo, MD Phone: [phone]  Reason For Visit: Left foot swelling, acute visit  # Left foot swelling. History of left foot swelling on dorsal surface, between metatarsal and tarsal joint line, occurring in November. This self resolved. Patient was unsure of how long she had this swelling for at that time. Swelling again on left foot with pain upon walking or moving her foot. No history of any changes in walking habits. No history of trauma. No history of changes in foot wear.  Past Medical History Reviewed problem list.  Medications- reviewed and updated No additions to family history Social history- patient is a non- smoker  Objective: BP 118/62 mmHg  Pulse 68  Temp(Src) 98.2 F (36.8 C) (Oral)  Ht 5\' 4"  (1.626 m)  Wt 163 lb 9.6 oz (74.208 kg)  BMI 28.07 kg/m2  SpO2 100%  LMP 11/07/2015 Gen: NAD, alert, cooperative with exam Foot: 2 by 3 cm circular lesion of the dorsal surface of left foot, midline at the metatarsal -tarsal joint line. Non- erythematous, slightly painful to palpation on swollen area with no tenderness to palpation throughout the rest of the left foot. Equal strength with flexion and extension of bilateral feet. DP pulse intact.     Assessment/Plan:  Foot swelling 2 by 3 cm circular lesion of the dorsal surface of left foot. Non-infectious looking. Subcutaneous tissue swelling on unknown origin. Possibly a lipoma  - Continue ibuprofen for the next 5 days - If no improvement in 5 days will refer to sports medicine

## 2016-01-13 ENCOUNTER — Telehealth: Payer: Self-pay | Admitting: Family Medicine

## 2016-01-13 DIAGNOSIS — M7989 Other specified soft tissue disorders: Secondary | ICD-10-CM

## 2016-01-13 NOTE — Telephone Encounter (Signed)
Patient's foot not better.  New knot have formed on same foot.  Need referral to a foot doctor.  Please contact patient regarding referral and appointment.

## 2016-01-14 NOTE — Telephone Encounter (Signed)
Patient states she called and set up her own appointment with a podiatrist. Doesn't want sports med referral.

## 2016-01-14 NOTE — Telephone Encounter (Signed)
I have sent in a referral to sports medicine for patient's foot nodule. Please let them know.

## 2016-01-17 DIAGNOSIS — H40033 Anatomical narrow angle, bilateral: Secondary | ICD-10-CM | POA: Diagnosis not present

## 2016-01-17 DIAGNOSIS — H1013 Acute atopic conjunctivitis, bilateral: Secondary | ICD-10-CM | POA: Diagnosis not present

## 2016-01-21 DIAGNOSIS — M7752 Other enthesopathy of left foot: Secondary | ICD-10-CM | POA: Diagnosis not present

## 2016-01-21 DIAGNOSIS — M2408 Loose body, other site: Secondary | ICD-10-CM | POA: Diagnosis not present

## 2016-02-04 ENCOUNTER — Ambulatory Visit (INDEPENDENT_AMBULATORY_CARE_PROVIDER_SITE_OTHER): Payer: BLUE CROSS/BLUE SHIELD

## 2016-02-04 ENCOUNTER — Encounter: Payer: Self-pay | Admitting: Gynecology

## 2016-02-04 ENCOUNTER — Ambulatory Visit (INDEPENDENT_AMBULATORY_CARE_PROVIDER_SITE_OTHER): Payer: BLUE CROSS/BLUE SHIELD | Admitting: Gynecology

## 2016-02-04 ENCOUNTER — Other Ambulatory Visit: Payer: Self-pay | Admitting: Gynecology

## 2016-02-04 VITALS — BP 126/82

## 2016-02-04 DIAGNOSIS — N920 Excessive and frequent menstruation with regular cycle: Secondary | ICD-10-CM

## 2016-02-04 DIAGNOSIS — D500 Iron deficiency anemia secondary to blood loss (chronic): Secondary | ICD-10-CM

## 2016-02-04 DIAGNOSIS — N852 Hypertrophy of uterus: Secondary | ICD-10-CM | POA: Diagnosis not present

## 2016-02-04 DIAGNOSIS — N921 Excessive and frequent menstruation with irregular cycle: Secondary | ICD-10-CM

## 2016-02-04 LAB — CBC WITH DIFFERENTIAL/PLATELET
Basophils Absolute: 0 cells/uL (ref 0–200)
Basophils Relative: 0 %
EOS PCT: 0 %
Eosinophils Absolute: 0 cells/uL — ABNORMAL LOW (ref 15–500)
HCT: 32.9 % — ABNORMAL LOW (ref 35.0–45.0)
HEMOGLOBIN: 10.8 g/dL — AB (ref 11.7–15.5)
LYMPHS ABS: 2451 {cells}/uL (ref 850–3900)
Lymphocytes Relative: 43 %
MCH: 31.3 pg (ref 27.0–33.0)
MCHC: 32.8 g/dL (ref 32.0–36.0)
MCV: 95.4 fL (ref 80.0–100.0)
MPV: 8.6 fL (ref 7.5–12.5)
Monocytes Absolute: 627 cells/uL (ref 200–950)
Monocytes Relative: 11 %
NEUTROS ABS: 2622 {cells}/uL (ref 1500–7800)
NEUTROS PCT: 46 %
PLATELETS: 251 10*3/uL (ref 140–400)
RBC: 3.45 MIL/uL — AB (ref 3.80–5.10)
RDW: 16 % — ABNORMAL HIGH (ref 11.0–15.0)
WBC: 5.7 10*3/uL (ref 3.8–10.8)

## 2016-02-04 MED ORDER — MEDROXYPROGESTERONE ACETATE 150 MG/ML IM SUSP
150.0000 mg | Freq: Once | INTRAMUSCULAR | Status: AC
  Administered 2016-02-04: 150 mg via INTRAMUSCULAR

## 2016-02-04 NOTE — Patient Instructions (Signed)
Laparoscopically Assisted Vaginal Hysterectomy A laparoscopically assisted vaginal hysterectomy (LAVH) is a surgical procedure to remove the uterus and cervix, and sometimes the ovaries and fallopian tubes. During an LAVH, some of the surgical removal is done through the vagina, and the rest is done through a few small surgical cuts (incisions) in the abdomen.  This procedure is usually considered in women when a vaginal hysterectomy is not an option. Your health care provider will discuss the risks and benefits of the different surgical techniques at your appointment. Generally, recovery time is faster and there are fewer complications after laparoscopic procedures than after open incisional procedures. LET YOUR HEALTH CARE PROVIDER KNOW ABOUT:   Any allergies you have.  All medicines you are taking, including vitamins, herbs, eye drops, creams, and over-the-counter medicines.  Previous problems you or members of your family have had with the use of anesthetics.  Any blood disorders you have.  Previous surgeries you have had.  Medical conditions you have. RISKS AND COMPLICATIONS Generally, this is a safe procedure. However, as with any procedure, complications can occur. Possible complications include:  Allergies to medicines.  Difficulty breathing.  Bleeding.  Infection.  Damage to other structures near your uterus and cervix. BEFORE THE PROCEDURE  Ask your health care provider about changing or stopping your regular medicines.  Take certain medicines, such as a colon-emptying preparation, as directed.  Do not eat or drink anything for at least 8 hours before your surgery.  Stop smoking if you smoke. Stopping will improve your health after surgery.  Arrange for a ride home after surgery and for help at home during recovery. PROCEDURE   An IV tube will be put into one of your veins in order to give you fluids and medicines.  You will receive medicines to relax you and  medicines that make you sleep (general anesthetic).  You may have a flexible tube (catheter) put into your bladder to drain urine.  You may have a tube put through your nose or mouth that goes into your stomach (nasogastric tube). The nasogastric tube removes digestive fluids and prevents you from feeling nauseated and from vomiting.  Tight-fitting (compression) stockings will be placed on your legs to promote circulation.  Three to four small incisions will be made in your abdomen. An incision also will be made in your vagina. Probes and tools will be inserted into the small incisions. The uterus and cervix are removed (and possibly your ovaries and fallopian tubes) through your vagina as well as through the small incisions that were made in the abdomen.  Your vagina is then sewn back to normal. AFTER THE PROCEDURE  You may have a liquid diet temporarily. You will most likely return to, and tolerate, your usual diet the day after surgery.  You will be passing urine through a catheter. It will be removed the day after surgery.  Your temperature, breathing rate, heart rate, blood pressure, and oxygen level will be monitored regularly.  You will still wear compression stockings on your legs until you are able to move around.  You will use a special device or do breathing exercises to keep your lungs clear.  You will be encouraged to walk as soon as possible.   This information is not intended to replace advice given to you by your health care provider. Make sure you discuss any questions you have with your health care provider.   Document Released: 09/23/2011 Document Revised: 10/25/2014 Document Reviewed: 04/19/2013 Elsevier Interactive Patient Education 2016 Elsevier   Inc.  

## 2016-02-04 NOTE — Progress Notes (Signed)
   Patient is a 35 year old gravida 6 para 4 (3 cesarean sections one vaginal delivery/tubal ligation) who presented to the office today for an ultrasound as part of her workup because of her metromenorrhagia. She is also due for her second Depo-Provera injection. Patient has suffered from metromenorrhagia and has contributed to her anemia as a result of her chronic blood loss. Patient has history of transfusions, lowest Hgb 5.5 on 11/07/2015 requiring several transfusions, she states last hemoglobin was 12, we do not have that record. Reports has often has hemoglobins of 7 over the last few years. Given Depo-Provera on 11/07/2015, and had an extremely light period after. Gets IV iron, cannot tolerate PO. History of hypothyroidism since age 37 on 125 mcg Synthroid, TSH 2 months ago was 50.4, improved from 88 2 months earlier. Reports normal pap history. Patient in the past had attempted oral contraceptive pills because her blood pressure be elevated.  Patient's last CBC as follows: Results for SHIMIKA, BAILER (MRN [id number]) as of 02/04/2016 17:04  Ref. Range 11/08/2015 10:20 11/14/2015 14:50  WBC Latest Ref Range: 4.0-10.5 K/uL 5.9 6.1  RBC Latest Ref Range: 3.87-5.11 MIL/uL 4.37 4.29  Hemoglobin Latest Ref Range: 12.0-15.0 g/dL 10.9 (L) 10.8 (L)  HCT Latest Ref Range: 36.0-46.0 % 34.4 (L) 34.7 (L)  MCV Latest Ref Range: 78.0-100.0 fL 78.7 80.9  MCH Latest Ref Range: 26.0-34.0 pg 24.9 (L) 25.2 (L)  MCHC Latest Ref Range: 30.0-36.0 g/dL 31.7 31.1  RDW Latest Ref Range: 11.5-15.5 % 20.3 (H) 23.3 (H)  Platelets Latest Ref Range: 150-400 K/uL 230 484 (H)  MPV Latest Ref Range: 8.6-12.4 fL  9.2   Patient's ultrasound today demonstrated the following: Uterus measured 10.9 x 5.8 x 3.3 cm with endometrial stripe of 4.6 mm. Normal-appearing uterus right and left ovary were normal no fluid in the cul-de-sac.   Assessment for/plan: 35 year old gravida 6 para 4 AB 2 (one vaginal delivery followed by 3  cesarean sections tubal ligation) with metromenorrhagia to the point that her severe anemia had brought her hemoglobin to low levels recurring IV infusion of iron since she cannot tolerate oral iron. Patient unable to tolerate oral contraceptive pill in the past which elevated her blood pressure. Patient will be scheduled for laparoscopic-assisted vaginal hysterectomy in July allow her hemoglobin to return back to normal. She is receiving her second Depo-Provera 150 mg IM injection today which is help control her bleeding. Literature information on [facility] was provided.

## 2016-02-09 ENCOUNTER — Ambulatory Visit (INDEPENDENT_AMBULATORY_CARE_PROVIDER_SITE_OTHER): Payer: BLUE CROSS/BLUE SHIELD | Admitting: Medical

## 2016-02-09 ENCOUNTER — Encounter: Payer: Self-pay | Admitting: Medical

## 2016-02-09 VITALS — BP 110/72 | HR 63 | Wt 168.0 lb

## 2016-02-09 DIAGNOSIS — D509 Iron deficiency anemia, unspecified: Secondary | ICD-10-CM

## 2016-02-09 DIAGNOSIS — Z9289 Personal history of other medical treatment: Secondary | ICD-10-CM | POA: Insufficient documentation

## 2016-02-09 DIAGNOSIS — E038 Other specified hypothyroidism: Secondary | ICD-10-CM

## 2016-02-09 DIAGNOSIS — Z130 Encounter for screening for diseases of the blood and blood-forming organs and certain disorders involving the immune mechanism: Secondary | ICD-10-CM

## 2016-02-09 NOTE — Progress Notes (Signed)
Subjective: Chief Complaint  Patient presents with  . New Patient (Initial Visit)    said her thyroid levels have not been checked since january. brought medication bottles. still has some medication but needs them refilled.    Here as a new patient.  Was seeing [facility] originally but could never see "her doctor."  So wanted to change providers.  Here for thyroid check.  Hasn't checked thyroid labs since January.   Takes 100 mcg + 25 mcg Levoxyl daily.   At last check levels were off.  Is gaining some weight, but no other noted problems on the medication.   Does note some fatigue, but no skin or hair changes.  Has headaches occasion.   Exercise with walking most days.   Works at [employer].  Takes medication on empty stomach every morning.   Had ablation when younger for overactive thyroid.  Went on medication for hypothyroid in 2002.   Normally was getting labs frequently sometimes every 6 weeks.  She is anemia, on iron, and had to have transfusion back in January 2017.  Was having heavy periods before going on Depo Provera in January.   No blood in stool or hx/o hemorrhoids.   Sister's son/nephew has a blood disorder, and there is sickle cell trait in the family.   She notes than there thyroid levels have always been difficult to control.  Has seen endocrinology in the past.   Past Medical History  Diagnosis Date  . Hypothyroidism     post-radiation at age 35  . Anemia     Chronic problem for patient.  Followed by Heme, receives IV iron transfusions secondary to inability to tolerate PO supplements.     ROS as in subjective    Objective: BP 110/72 mmHg  Pulse 63  Wt 168 lb (76.204 kg)  LMP 11/07/2015  General appearance: alert, no distress, WD/WN Neck: supple, no lymphadenopathy, no thyromegaly, no masses Heart: RRR, normal S1, S2, no murmurs Lungs: CTA bilaterally, no wheezes, rhonchi, or rales Extremities: no edema, no cyanosis, no clubbing Pulses: 2+ symmetric,  upper and lower extremities, normal cap refill Psychiatric: normal affect, behavior normal, pleasant     Assessment: Encounter Diagnoses  Name Primary?  . Other specified hypothyroidism Yes  . Iron deficiency anemia   . Encounter for sickle-cell screening   . History of transfusion     Plan: discussed her anemia, other eval to rule out other causes of anemia.  F/u with gynecology and she will return stool cards x 3.    hypothyroidism - labs today.  Considering switching to name brand Synthroid, discussed narrow therapeutic window, proper use of medication.    Labs today.   Tareva was seen today for new patient (initial visit).  Diagnoses and all orders for this visit:  Other specified hypothyroidism -     Sickle cell screen -     TSH -     T4, free -     T3, Free -     Comprehensive metabolic panel  Iron deficiency anemia -     Sickle cell screen -     Comprehensive metabolic panel  Encounter for sickle-cell screening -     Sickle cell screen  History of transfusion

## 2016-02-10 ENCOUNTER — Other Ambulatory Visit: Payer: Self-pay | Admitting: Medical

## 2016-02-10 LAB — COMPREHENSIVE METABOLIC PANEL
ALBUMIN: 3.9 g/dL (ref 3.6–5.1)
ALT: 19 U/L (ref 6–29)
AST: 21 U/L (ref 10–30)
Alkaline Phosphatase: 45 U/L (ref 33–115)
BILIRUBIN TOTAL: 0.4 mg/dL (ref 0.2–1.2)
BUN: 16 mg/dL (ref 7–25)
CALCIUM: 9.3 mg/dL (ref 8.6–10.2)
CHLORIDE: 106 mmol/L (ref 98–110)
CO2: 25 mmol/L (ref 20–31)
Creat: 0.83 mg/dL (ref 0.50–1.10)
GLUCOSE: 101 mg/dL — AB (ref 65–99)
POTASSIUM: 4 mmol/L (ref 3.5–5.3)
Sodium: 140 mmol/L (ref 135–146)
Total Protein: 7 g/dL (ref 6.1–8.1)

## 2016-02-10 LAB — SICKLE CELL SCREEN: SICKLE CELL SCREEN: NEGATIVE

## 2016-02-10 LAB — TSH: TSH: 5.41 m[IU]/L — AB

## 2016-02-10 LAB — T3, FREE: T3, Free: 2.1 pg/mL — ABNORMAL LOW (ref 2.3–4.2)

## 2016-02-10 LAB — T4, FREE: FREE T4: 1.2 ng/dL (ref 0.8–1.8)

## 2016-02-10 MED ORDER — SYNTHROID 137 MCG PO TABS: 137.0000 ug | ORAL_TABLET | Freq: Every day | ORAL | Status: DC

## 2016-02-13 ENCOUNTER — Other Ambulatory Visit: Payer: BLUE CROSS/BLUE SHIELD

## 2016-02-13 DIAGNOSIS — D509 Iron deficiency anemia, unspecified: Secondary | ICD-10-CM

## 2016-02-20 ENCOUNTER — Telehealth: Payer: Self-pay

## 2016-02-20 NOTE — Telephone Encounter (Signed)
Ok thanks 

## 2016-02-20 NOTE — Telephone Encounter (Signed)
I called patient to discuss scheduling surgery.  Her insuance ded and oop max are met and ins benefits are being paid at 100%.  We discussed dates and scheduled her for June 20 at 10:30am. She will see Dr. Moshe Salisbury on 03/29/16 3:30pm for pre op.  Dr. JF-Patient mentioned that she has her regular appointment with hematologist on May 24. She said they will check her labs and if not where they need to be they will infuse her that day.  Just FYI

## 2016-03-03 DIAGNOSIS — Z0289 Encounter for other administrative examinations: Secondary | ICD-10-CM

## 2016-03-05 ENCOUNTER — Ambulatory Visit (INDEPENDENT_AMBULATORY_CARE_PROVIDER_SITE_OTHER): Payer: BLUE CROSS/BLUE SHIELD | Admitting: Family Medicine

## 2016-03-05 ENCOUNTER — Encounter: Payer: Self-pay | Admitting: Family Medicine

## 2016-03-05 ENCOUNTER — Encounter: Payer: Self-pay | Admitting: Anesthesiology

## 2016-03-05 VITALS — BP 126/70 | HR 75 | Wt 174.6 lb

## 2016-03-05 DIAGNOSIS — G43809 Other migraine, not intractable, without status migrainosus: Secondary | ICD-10-CM | POA: Diagnosis not present

## 2016-03-05 DIAGNOSIS — E038 Other specified hypothyroidism: Secondary | ICD-10-CM

## 2016-03-05 MED ORDER — IBUPROFEN 800 MG PO TABS: 800.0000 mg | ORAL_TABLET | Freq: Three times a day (TID) | ORAL | Status: DC | PRN

## 2016-03-05 NOTE — Progress Notes (Signed)
   Subjective:    Patient ID: Miranda Fowler, female    DOB: 01-19-1981, 35 y.o.   MRN: [id number]  HPI  he developed a dull headache over her entire head on Wednesday. Thursday developed and a bilateral throbbing type headache with some nausea and photophobia. She did take 1 ibuprofen without much excess. She had a previous episode of this in January and was treated with Zofran and 800 mg ibuprofen. She has no aura , phonophobia , vomiting. She then mentioned something about having blood drawn for her thyroid function.   Review of Systems     Objective:   Physical Exam Alert and in no distress. Tympanic membranes and canals are normal. Pharyngeal area is normal. Neck is supple without adenopathy or thyromegaly. Cardiac exam shows a regular sinus rhythm without murmurs or gallops. Lungs are clear to auscultation.        Assessment & Plan:  Other specified hypothyroidism  Migraine variant with headache  she does seem to have a   migraine variant. I will treat her with ibuprofen. Discussed the possible use of the triptan at a later date if no benefit. We'll also put in a future order for TSH in 1 month.

## 2016-03-10 ENCOUNTER — Telehealth: Payer: Self-pay | Admitting: Oncology

## 2016-03-10 ENCOUNTER — Ambulatory Visit (HOSPITAL_BASED_OUTPATIENT_CLINIC_OR_DEPARTMENT_OTHER): Payer: BLUE CROSS/BLUE SHIELD | Admitting: Oncology

## 2016-03-10 ENCOUNTER — Other Ambulatory Visit (HOSPITAL_BASED_OUTPATIENT_CLINIC_OR_DEPARTMENT_OTHER): Payer: BLUE CROSS/BLUE SHIELD

## 2016-03-10 VITALS — BP 130/71 | HR 67 | Temp 98.1°F | Resp 18 | Ht 64.0 in | Wt 172.4 lb

## 2016-03-10 DIAGNOSIS — N920 Excessive and frequent menstruation with regular cycle: Secondary | ICD-10-CM

## 2016-03-10 DIAGNOSIS — D509 Iron deficiency anemia, unspecified: Secondary | ICD-10-CM

## 2016-03-10 LAB — CBC WITH DIFFERENTIAL/PLATELET
BASO%: 1 % (ref 0.0–2.0)
BASOS ABS: 0.1 10*3/uL (ref 0.0–0.1)
EOS%: 0.1 % (ref 0.0–7.0)
Eosinophils Absolute: 0 10*3/uL (ref 0.0–0.5)
HCT: 35.3 % (ref 34.8–46.6)
HGB: 11.8 g/dL (ref 11.6–15.9)
LYMPH%: 36.8 % (ref 14.0–49.7)
MCH: 33.7 pg (ref 25.1–34.0)
MCHC: 33.5 g/dL (ref 31.5–36.0)
MCV: 100.7 fL (ref 79.5–101.0)
MONO#: 0.6 10*3/uL (ref 0.1–0.9)
MONO%: 11.8 % (ref 0.0–14.0)
NEUT%: 50.3 % (ref 38.4–76.8)
NEUTROS ABS: 2.6 10*3/uL (ref 1.5–6.5)
PLATELETS: 259 10*3/uL (ref 145–400)
RBC: 3.5 10*6/uL — AB (ref 3.70–5.45)
RDW: 12.1 % (ref 11.2–14.5)
WBC: 5.3 10*3/uL (ref 3.9–10.3)
lymph#: 1.9 10*3/uL (ref 0.9–3.3)

## 2016-03-10 NOTE — Telephone Encounter (Signed)
per pof to sch pt appt-sent MW email to sch fera-will call pt after reply °

## 2016-03-10 NOTE — Progress Notes (Signed)
Hematology and Oncology Follow Up Visit  Miranda Fowler [id number] 1981-02-12 35 y.o. 03/10/2016 3:24 PM  CC: Rocky Crafts, DO    Principle Diagnosis:  A 35 year old female with iron-deficiency anemia diagnosed in August 2009 who presented with hemoglobin of 7.2, ferritin of 3. Her iron deficiency is related due to menorrhagia.  Prior Therapy: She is status post IV iron infusion on few occasions most recent switch in January 2017.  Current therapy: Oral iron therapy which she have not taken consistently because of poor tolerance.  Interim History: Miranda Fowler presents today for a followup visit. Since the last visit, she received IV iron infusion on tolerated it well. She felt some energy improvement and activity level. However, she did report some slight decline as of late including more lethargy and sleepiness. She has been on Depo-Provera which have helped decrease her heavy menses and have not had any menorrhagia. She denied any hematochezia or melena. She is scheduled to have a hysterectomy on June 20.  She does not report any headaches, blurry vision, syncope or seizures. She does not report any fevers, chills or sweats. She does not report any cough, wheezing or hemoptysis. Does not report any nausea, vomiting or abdominal pain. She does not report any hematochezia, melena or hematemesis. She does not report any frequency urgency or hesitancy. She does not report any skeletal complaints. Remaining review of systems unremarkable.  Medications: I have reviewed the patient's current medications.  Current outpatient prescriptions:  .  cetirizine (ZYRTEC ALLERGY) 10 MG tablet, Take 1 tablet (10 mg total) by mouth daily., Disp: 30 tablet, Rfl: 5 .  ibuprofen (ADVIL,MOTRIN) 800 MG tablet, Take 1 tablet (800 mg total) by mouth every 8 (eight) hours as needed., Disp: 30 tablet, Rfl: 0 .  medroxyPROGESTERone (DEPO-PROVERA) 150 MG/ML injection, Inject 1 mL (150 mg total) into the muscle every  3 (three) months., Disp: 1 mL, Rfl: 3 .  meloxicam (MOBIC) 15 MG tablet, Take 1 tablet (15 mg total) by mouth daily as needed for pain., Disp: 30 tablet, Rfl: 3 .  SYNTHROID 137 MCG tablet, Take 1 tablet (137 mcg total) by mouth daily before breakfast., Disp: 30 tablet, Rfl: 2  Allergies: No Known Allergies  Past Medical History, Surgical history, Social history, and Family History were reviewed and updated.   Physical Exam: Blood pressure 130/71, pulse 67, temperature 98.1 F (36.7 C), temperature source Oral, resp. rate 18, height 5\' 4"  (1.626 m), weight 172 lb 6.4 oz (78.2 kg), SpO2 100 %. ECOG: 0 General appearance: alert, awake woman without distress. Head: Normocephalic, without obvious abnormality no oral ulcers or lesions. Neck: no adenopathy Lymph nodes: Cervical, supraclavicular, and axillary nodes normal. Heart:regular rate and rhythm, S1, S2 normal, no murmur, click, rub or gallop Lung:chest clear, no wheezing, rales, normal symmetric air entry Abdomen: soft, non-tender, without masses or organomegaly no rebound or guarding. EXT:no erythema, induration, or nodules   Lab Results: Lab Results  Component Value Date   WBC 5.3 03/10/2016   HGB 11.8 03/10/2016   HCT 35.3 03/10/2016   MCV 100.7 03/10/2016   PLT 259 03/10/2016     Chemistry      Component Value Date/Time   NA 140 02/09/2016 0001   NA 140 11/02/2013 1336   K 4.0 02/09/2016 0001   K 3.8 11/02/2013 1336   CL 106 02/09/2016 0001   CL 108* 12/14/2012 1342   CO2 25 02/09/2016 0001   CO2 27 11/02/2013 1336   BUN 16 02/09/2016  0001   BUN 9.1 11/02/2013 1336   CREATININE 0.83 02/09/2016 0001   CREATININE 1.18* 11/08/2015 1020   CREATININE 0.8 11/02/2013 1336      Component Value Date/Time   CALCIUM 9.3 02/09/2016 0001   CALCIUM 8.9 11/02/2013 1336   ALKPHOS 45 02/09/2016 0001   ALKPHOS 47 11/02/2013 1336   AST 21 02/09/2016 0001   AST 16 11/02/2013 1336   ALT 19 02/09/2016 0001   ALT 8 11/02/2013  1336   BILITOT 0.4 02/09/2016 0001   BILITOT 0.36 11/02/2013 1336      Impression and Plan:  A 35 year old female with the following issues:   1. Iron-deficiency anemia. This is likely related to have menorrhagia dating back to 2009. She Is status post IV iron infusion on multiple occasions most recent of which in January 2017. Her hemoglobin has normalized at this time although her iron studies are pending. She is becoming more symptomatic indicating the possible development of worsening iron deficiency again. She is scheduled to have surgery in the next 4 weeks and in the anticipation of low iron we will schedule her IV iron infusion in the next couple weeks in case her iron studies are low. We wanted to ensure adequate hemoglobin and preparation for her hysterectomy. After her hysterectomy, she might not require further IV iron replacement. 2. Menorrhagia. She received Depo-Provera to decrease her menses which have helped at this time.   She is scheduled to have a hysterectomy for definitive treatment for menorrhagia. 3. Follow-up. In 4 months.  Vernie Vinciguerra 5/24/20173:24 PM

## 2016-03-11 ENCOUNTER — Telehealth: Payer: Self-pay | Admitting: Oncology

## 2016-03-11 ENCOUNTER — Telehealth: Payer: Self-pay | Admitting: *Deleted

## 2016-03-11 LAB — IRON AND TIBC
%SAT: 16 % — ABNORMAL LOW (ref 21–57)
IRON: 35 ug/dL — AB (ref 41–142)
TIBC: 218 ug/dL — AB (ref 236–444)
UIBC: 182 ug/dL (ref 120–384)

## 2016-03-11 LAB — FERRITIN: Ferritin: 127 ng/ml (ref 9–269)

## 2016-03-11 NOTE — Telephone Encounter (Signed)
Open my miskae

## 2016-03-11 NOTE — Telephone Encounter (Signed)
cld & spoke to pt and gave pt time & date of appt for 6/2 &6/9@10 :15

## 2016-03-16 ENCOUNTER — Telehealth: Payer: Self-pay

## 2016-03-16 NOTE — Telephone Encounter (Addendum)
Patient's husband called because I had previously faxed completed FMLA forms to his job so that they would excuse him intermittently to assist patient as she needs him during her recovery.  He said that company had contacted him and were not satisfied with how I completed the forms. He had specific question #'s that they noted.  He said they told him it appeared I was taking him out for the whole 8 weeks of her recovery. However, in several places I did state that he would only be needed "off and on" and days she would not need him at all and other times she would.  I pulled the forms and went back and wrote" intermittently"  On most of the answers. I refaxed the forms and on the cover asked the company to call me if there were any other issues and I provided my direct phone number. THis was refaxed this morning around 10:00am.  I discussed all of this with husband, Miranda Fowler'.

## 2016-03-19 ENCOUNTER — Ambulatory Visit (HOSPITAL_BASED_OUTPATIENT_CLINIC_OR_DEPARTMENT_OTHER): Payer: BLUE CROSS/BLUE SHIELD

## 2016-03-19 ENCOUNTER — Telehealth: Payer: Self-pay | Admitting: *Deleted

## 2016-03-19 VITALS — BP 117/84 | HR 71 | Temp 98.4°F | Resp 18

## 2016-03-19 DIAGNOSIS — D509 Iron deficiency anemia, unspecified: Secondary | ICD-10-CM

## 2016-03-19 MED ORDER — SODIUM CHLORIDE 0.9 % IV SOLN
510.0000 mg | Freq: Once | INTRAVENOUS | Status: AC
  Administered 2016-03-19: 510 mg via INTRAVENOUS
  Filled 2016-03-19: qty 17

## 2016-03-19 MED ORDER — SODIUM CHLORIDE 0.9 % IV SOLN
Freq: Once | INTRAVENOUS | Status: AC
  Administered 2016-03-19: 11:00:00 via INTRAVENOUS

## 2016-03-19 NOTE — Patient Instructions (Signed)

## 2016-03-19 NOTE — Telephone Encounter (Signed)
Per patient request I have moved 6/9 appt to earlier in the morning

## 2016-03-25 NOTE — Patient Instructions (Signed)
Your procedure is scheduled on:  Tuesday, April 06, 2016  Enter through the Main Entrance of [facility] at:  9:00 AM  Pick up the phone at the desk and dial [phone].  Call this number if you have problems the morning of surgery: [phone].  Remember: Do NOT eat food or drink after:  Midnight Monday, April 05, 2016  Take these medicines the morning of surgery with a SIP OF WATER:  Synthroid  Stop taking fish oil at this time.  Do NOT wear jewelry (body piercing), metal hair clips/bobby pins, make-up, or nail polish. Do NOT wear lotions, powders, or perfumes.  You may wear deodorant. Do NOT shave for 48 hours prior to surgery. Do NOT bring valuables to the hospital. Contacts, dentures, or bridgework may not be worn into surgery.  Leave suitcase in car.  After surgery it may be brought to your room.  For patients admitted to the hospital, checkout time is 11:00 AM the day of discharge.

## 2016-03-26 ENCOUNTER — Encounter (HOSPITAL_COMMUNITY): Payer: Self-pay

## 2016-03-26 ENCOUNTER — Encounter (HOSPITAL_COMMUNITY)
Admission: RE | Admit: 2016-03-26 | Discharge: 2016-03-26 | Disposition: A | Discharge status: Home / Self Care | Payer: BLUE CROSS/BLUE SHIELD | Source: Ambulatory Visit | Attending: Gynecology | Admitting: Gynecology

## 2016-03-26 ENCOUNTER — Ambulatory Visit (HOSPITAL_BASED_OUTPATIENT_CLINIC_OR_DEPARTMENT_OTHER): Payer: BLUE CROSS/BLUE SHIELD

## 2016-03-26 VITALS — BP 98/59 | HR 69 | Temp 98.2°F | Resp 18

## 2016-03-26 DIAGNOSIS — D509 Iron deficiency anemia, unspecified: Secondary | ICD-10-CM

## 2016-03-26 DIAGNOSIS — Z01812 Encounter for preprocedural laboratory examination: Secondary | ICD-10-CM | POA: Insufficient documentation

## 2016-03-26 HISTORY — DX: Reserved for inherently not codable concepts without codable children: IMO0001

## 2016-03-26 HISTORY — DX: Headache, unspecified: R51.9

## 2016-03-26 HISTORY — DX: Unspecified osteoarthritis, unspecified site: M19.90

## 2016-03-26 HISTORY — DX: Headache: R51

## 2016-03-26 MED ORDER — SODIUM CHLORIDE 0.9 % IV SOLN
510.0000 mg | Freq: Once | INTRAVENOUS | Status: AC
  Administered 2016-03-26: 510 mg via INTRAVENOUS
  Filled 2016-03-26: qty 17

## 2016-03-26 MED ORDER — SODIUM CHLORIDE 0.9 % IV SOLN
Freq: Once | INTRAVENOUS | Status: AC
  Administered 2016-03-26: 09:00:00 via INTRAVENOUS

## 2016-03-26 NOTE — Patient Instructions (Signed)

## 2016-03-26 NOTE — Pre-Procedure Instructions (Signed)
Dr. Jillyn Hidden was ok with not drawing another CBC since patient last had CBC with diff 03/10/16 and received an iron infusion 03/26/16.

## 2016-03-29 ENCOUNTER — Ambulatory Visit (INDEPENDENT_AMBULATORY_CARE_PROVIDER_SITE_OTHER): Payer: BLUE CROSS/BLUE SHIELD | Admitting: Gynecology

## 2016-03-29 ENCOUNTER — Encounter: Payer: Self-pay | Admitting: Gynecology

## 2016-03-29 VITALS — BP 128/76

## 2016-03-29 DIAGNOSIS — N920 Excessive and frequent menstruation with regular cycle: Secondary | ICD-10-CM

## 2016-03-29 DIAGNOSIS — E038 Other specified hypothyroidism: Secondary | ICD-10-CM | POA: Diagnosis not present

## 2016-03-29 DIAGNOSIS — Z01818 Encounter for other preprocedural examination: Secondary | ICD-10-CM

## 2016-03-29 DIAGNOSIS — D500 Iron deficiency anemia secondary to blood loss (chronic): Secondary | ICD-10-CM | POA: Diagnosis not present

## 2016-03-29 LAB — CBC WITH DIFFERENTIAL/PLATELET
BASOS ABS: 0 {cells}/uL (ref 0–200)
Basophils Relative: 0 %
EOS ABS: 0 {cells}/uL — AB (ref 15–500)
Eosinophils Relative: 0 %
HEMATOCRIT: 34.2 % — AB (ref 35.0–45.0)
HEMOGLOBIN: 11.6 g/dL — AB (ref 11.7–15.5)
LYMPHS ABS: 1886 {cells}/uL (ref 850–3900)
Lymphocytes Relative: 46 %
MCH: 33.1 pg — AB (ref 27.0–33.0)
MCHC: 33.9 g/dL (ref 32.0–36.0)
MCV: 97.7 fL (ref 80.0–100.0)
MONO ABS: 574 {cells}/uL (ref 200–950)
MPV: 8.9 fL (ref 7.5–12.5)
Monocytes Relative: 14 %
NEUTROS ABS: 1640 {cells}/uL (ref 1500–7800)
NEUTROS PCT: 40 %
Platelets: 258 10*3/uL (ref 140–400)
RBC: 3.5 MIL/uL — ABNORMAL LOW (ref 3.80–5.10)
RDW: 12.1 % (ref 11.0–15.0)
WBC: 4.1 10*3/uL (ref 3.8–10.8)

## 2016-03-29 LAB — TSH: TSH: 3.04 m[IU]/L

## 2016-03-29 MED ORDER — METOCLOPRAMIDE HCL 10 MG PO TABS: 10.0000 mg | ORAL_TABLET | Freq: Three times a day (TID) | ORAL | Status: DC

## 2016-03-29 MED ORDER — OXYCODONE-ACETAMINOPHEN 5-325 MG PO TABS: 1.0000 | ORAL_TABLET | ORAL | Status: DC | PRN

## 2016-03-29 MED ORDER — PHENAZOPYRIDINE HCL 200 MG PO TABS: ORAL_TABLET | ORAL | Status: DC

## 2016-03-29 NOTE — Progress Notes (Signed)
Miranda Fowler is an 35 y.o. female gravida 6 para 4 (3 cesarean sections and one vaginal delivery /tubal ligation, and 2 miscarriages). who is scheduled for laparoscopic-assisted vaginal hysterectomy possible laparotomy next week as a result so of her menorrhagia contributing to her chronic anemia that she has had to receive blood transfusion the last as well as IV iron infusion by the hematologist. See previous notes for detail. Patient's last hemoglobin was 11.8 on May 24. And she received her last IV iron infusion by her medical oncologist on June 9. She also has been on Depo-Provera 150 mg IM every 3 months to help control her bleeding she's barely spotting now. Patient had a normal ultrasound in April of this year.  Pertinent Gynecological History: Menses: See above Bleeding: See above Contraception: Bilateral salpingectomy DES exposure: unknown Blood transfusions: Once Sexually transmitted diseases: no past history Previous GYN Procedures: 3 cesarean sections, one vaginal delivery, bilateral salpingectomy  Last mammogram: Not indicated Date: Not indicated Last pap: normal Date: 2017 OB History: G 6, P 4 A2   Menstrual History: Menarche age: 13 No LMP recorded. Patient has had an injection.    Past Medical History  Diagnosis Date  . Hypothyroidism     post-radiation at age 35  . Anemia     Chronic problem for patient.  Followed by Heme, receives IV iron transfusions secondary to inability to tolerate PO supplements.    . Shortness of breath dyspnea   . Headache     Migraines  . Arthritis     bilateral knees    Past Surgical History  Procedure Laterality Date  . Bilateral salpingectomy Bilateral 2008    pt denies  . Tubal ligation    . Cesarean section      Family History  Problem Relation Age of Onset  . Diabetes Maternal Grandmother   . Cancer Paternal Aunt   . Heart disease Paternal Grandmother     Social History:  reports that she has never smoked. She  has never used smokeless tobacco. She reports that she drinks alcohol. She reports that she does not use illicit drugs.  Allergies: No Known Allergies   (Not in a hospital admission)  REVIEW OF SYSTEMS: A ROS was performed and pertinent positives and negatives are included in the history.  GENERAL: No fevers or chills. HEENT: No change in vision, no earache, sore throat or sinus congestion. NECK: No pain or stiffness. CARDIOVASCULAR: No chest pain or pressure. No palpitations. PULMONARY: No shortness of breath, cough or wheeze. GASTROINTESTINAL: No abdominal pain, nausea, vomiting or diarrhea, melena or bright red blood per rectum. GENITOURINARY: No urinary frequency, urgency, hesitancy or dysuria. MUSCULOSKELETAL: No joint or muscle pain, no back pain, no recent trauma. DERMATOLOGIC: No rash, no itching, no lesions. ENDOCRINE: No polyuria, polydipsia, no heat or cold intolerance. No recent change in weight. HEMATOLOGICAL: No anemia or easy bruising or bleeding. NEUROLOGIC: No headache, seizures, numbness, tingling or weakness. PSYCHIATRIC: No depression, no loss of interest in normal activity or change in sleep pattern.     Blood pressure 128/76.  Physical Exam:  HEENT:unremarkable Neck:Supple, midline, no thyroid megaly, no carotid bruits Lungs:  Clear to auscultation no rhonchi's or wheezes Heart:Regular rate and rhythm, no murmurs or gallops Breast Exam: Symmetrical in appearance no palpable masses or tenderness no supraclavicular axillary lymphadenopathy Abdomen: Soft nontender no rebound or guarding Pfannenstiel scar noted Pelvic:BUS within normal limits Vagina: No lesions or discharge Cervix: No lesions or discharge Uterus: Anteverted normal  size shape and consistency Adnexa: No palpable masses or tenderness Extremities: No cords, no edema Rectal: Unremarkable   Assessment/Plan: 35 year old patient with history of chronic anemia as a result of her menorrhagia requiring blood  transfusion in the past as well as IV iron infusion. Patient received her last infusion of iron last week. A CBC will be checked today along with a TSH because she has history of hypothyroidism and her physician had adjusted her medication not too long ago as had no follow-up TSH. She will be provided with a prescription for Percocet 5/325 to take 1 by mouth every 4-6 hours when necessary postop pain. Also prescription for Reglan 10 mg to take 1 by mouth every 4-6 hours when necessary nausea for postop. We also discussed that I was going to prescribe her Pyridium 200 mg to take 1 tablet the night before the surgery and one in the morning of the surgery which will facilitate assessing her ureteral patency during cystoscopy after her hysterectomy. The risks benefits and pros and cons of the operation discussed as follows below:                         Patient was counseled as to the risk of surgery to include the following:  1. Infection (prohylactic antibiotics will be administered)  2. DVT/Pulmonary Embolism (prophylactic pneumo compression stockings will be used)  3.Trauma to internal organs requiring additional surgical procedure to repair any injury to     Internal organs requiring perhaps additional hospitalization days.  4.Hemmorhage requiring transfusion and blood products which carry risks such as    anaphylactic reaction, hepatitis and AIDS  Patient had received literature information on the procedure scheduled and all her questions were answered and fully accepts all risk.   [facility] HMD4:56 PMTD     Terrance Mass 03/29/2016, 3:48 PM

## 2016-03-29 NOTE — Patient Instructions (Signed)
Laparoscopically Assisted Vaginal Hysterectomy A laparoscopically assisted vaginal hysterectomy (LAVH) is a surgical procedure to remove the uterus and cervix, and sometimes the ovaries and fallopian tubes. During an LAVH, some of the surgical removal is done through the vagina, and the rest is done through a few small surgical cuts (incisions) in the abdomen.  This procedure is usually considered in women when a vaginal hysterectomy is not an option. Your health care provider will discuss the risks and benefits of the different surgical techniques at your appointment. Generally, recovery time is faster and there are fewer complications after laparoscopic procedures than after open incisional procedures. LET YOUR HEALTH CARE PROVIDER KNOW ABOUT:   Any allergies you have.  All medicines you are taking, including vitamins, herbs, eye drops, creams, and over-the-counter medicines.  Previous problems you or members of your family have had with the use of anesthetics.  Any blood disorders you have.  Previous surgeries you have had.  Medical conditions you have. RISKS AND COMPLICATIONS Generally, this is a safe procedure. However, as with any procedure, complications can occur. Possible complications include:  Allergies to medicines.  Difficulty breathing.  Bleeding.  Infection.  Damage to other structures near your uterus and cervix. BEFORE THE PROCEDURE  Ask your health care provider about changing or stopping your regular medicines.  Take certain medicines, such as a colon-emptying preparation, as directed.  Do not eat or drink anything for at least 8 hours before your surgery.  Stop smoking if you smoke. Stopping will improve your health after surgery.  Arrange for a ride home after surgery and for help at home during recovery. PROCEDURE   An IV tube will be put into one of your veins in order to give you fluids and medicines.  You will receive medicines to relax you and  medicines that make you sleep (general anesthetic).  You may have a flexible tube (catheter) put into your bladder to drain urine.  You may have a tube put through your nose or mouth that goes into your stomach (nasogastric tube). The nasogastric tube removes digestive fluids and prevents you from feeling nauseated and from vomiting.  Tight-fitting (compression) stockings will be placed on your legs to promote circulation.  Three to four small incisions will be made in your abdomen. An incision also will be made in your vagina. Probes and tools will be inserted into the small incisions. The uterus and cervix are removed (and possibly your ovaries and fallopian tubes) through your vagina as well as through the small incisions that were made in the abdomen.  Your vagina is then sewn back to normal. AFTER THE PROCEDURE  You may have a liquid diet temporarily. You will most likely return to, and tolerate, your usual diet the day after surgery.  You will be passing urine through a catheter. It will be removed the day after surgery.  Your temperature, breathing rate, heart rate, blood pressure, and oxygen level will be monitored regularly.  You will still wear compression stockings on your legs until you are able to move around.  You will use a special device or do breathing exercises to keep your lungs clear.  You will be encouraged to walk as soon as possible.   This information is not intended to replace advice given to you by your health care provider. Make sure you discuss any questions you have with your health care provider.   Document Released: 09/23/2011 Document Revised: 10/25/2014 Document Reviewed: 04/19/2013 Elsevier Interactive Patient Education 2016 Else[employer]   Inc[employer]   Abdominal Hysterectomy Abdominal hysterectomy is a surgical procedure to remove your womb (uterus). Your uterus is the muscular organ that contains a developing baby. This surgery is done for many reasons. You  may need an abdominal hysterectomy if you have cancer, growths (tumors), long-term pain, or bleeding. You may also have this procedure if your uterus has slipped down into your vagina (uterine prolapse). Depending on why you need an abdominal hysterectomy, you may also have other reproductive organs removed. These could include the part of your vagina that connects with your uterus (cervix), the organs that make eggs (ovaries), and the tubes that connect the ovaries to the uterus (fallopian tubes). LET [facility] CARE PROVIDER KNOW ABOUT:   Any allergies you have.  All medicines you are taking, including vitamins, herbs, eye drops, creams, and over-the-counter medicines.  Previous problems you or members of your family have had with the use of anesthetics.  Any blood disorders you have.  Previous surgeries you have had.  Medical conditions you have. RISKS AND COMPLICATIONS Generally, this is a safe procedure. However, as with any procedure, problems can occur. Infection is the most common problem after an abdominal hysterectomy. Other possible problems include:  Bleeding.  Formation of blood clots that may break free and travel to your lungs.  Injury to other organs near your uterus.  Nerve injury causing nerve pain.  Decreased interest in sex or pain during sexual intercourse. BEFORE THE PROCEDURE  Abdominal hysterectomy is a major surgical procedure. It can affect the way you feel about yourself. Talk to your health care provider about the physical and emotional changes hysterectomy may cause.  You may need to have blood work and X-rays done before surgery.  Quit smoking if you smoke. Ask your health care provider for help if you are struggling to quit.  Stop taking medicines that thin your blood as directed by your health care provider.  You may be instructed to take antibiotic medicines or laxatives before surgery.  Do not eat or drink anything for 6-8 hours before  surgery.  Take your regular medicines with a small sip of water.  Bathe or shower the night or morning before surgery. PROCEDURE  Abdominal hysterectomy is done in the operating room at the hospital.  In most cases, you will be given a medicine that makes you go to sleep (general anesthetic).  The surgeon will make a cut (incision) through the skin in your lower belly.  The incision may be about 5-7 inches long. It may go side-to-side or up-and-down.  The surgeon will move aside the body tissue that covers your uterus. The surgeon will then carefully take out your uterus along with any of your other reproductive organs that need to be removed.  Bleeding will be controlled with clamps or sutures.  The surgeon will close your incision with sutures or metal clips. AFTER THE PROCEDURE  You will have some pain immediately after the procedure.  You will be given pain medicine in the recovery room.  You will be taken to your hospital room when you have recovered from the anesthesia.  You may need to stay in the hospital for 2-5 days.  You will be given instructions for recovery at home.   This information is not intended to replace advice given to you by your health care provider. Make sure you discuss any questions you have with your health care provider.   Document Released: 10/09/2013 Document Reviewed: 10/09/2013 Elsevier Interactive Patient Education 2016  [employer].

## 2016-03-30 NOTE — Progress Notes (Signed)
That's right. Ok thanks

## 2016-04-05 MED ORDER — DEXTROSE 5 % IV SOLN
2.0000 g | INTRAVENOUS | Status: AC
  Administered 2016-04-06: 2 g via INTRAVENOUS
  Filled 2016-04-05: qty 2

## 2016-04-06 ENCOUNTER — Ambulatory Visit (HOSPITAL_COMMUNITY): Payer: BLUE CROSS/BLUE SHIELD | Admitting: Anesthesiology

## 2016-04-06 ENCOUNTER — Encounter (HOSPITAL_COMMUNITY): Payer: Self-pay

## 2016-04-06 ENCOUNTER — Encounter (HOSPITAL_COMMUNITY)
Admission: RE | Disposition: A | Discharge status: Home / Self Care | Payer: Self-pay | Source: Ambulatory Visit | Attending: Gynecology

## 2016-04-06 ENCOUNTER — Observation Stay (HOSPITAL_COMMUNITY)
Admission: RE | Admit: 2016-04-06 | Discharge: 2016-04-07 | Disposition: A | Discharge status: Home / Self Care | Payer: BLUE CROSS/BLUE SHIELD | Source: Ambulatory Visit | Attending: Gynecology | Admitting: Gynecology

## 2016-04-06 DIAGNOSIS — N946 Dysmenorrhea, unspecified: Secondary | ICD-10-CM | POA: Diagnosis not present

## 2016-04-06 DIAGNOSIS — N800 Endometriosis of uterus: Secondary | ICD-10-CM | POA: Diagnosis not present

## 2016-04-06 DIAGNOSIS — D509 Iron deficiency anemia, unspecified: Secondary | ICD-10-CM | POA: Diagnosis not present

## 2016-04-06 DIAGNOSIS — N920 Excessive and frequent menstruation with regular cycle: Principal | ICD-10-CM | POA: Insufficient documentation

## 2016-04-06 DIAGNOSIS — D649 Anemia, unspecified: Secondary | ICD-10-CM | POA: Diagnosis not present

## 2016-04-06 DIAGNOSIS — D500 Iron deficiency anemia secondary to blood loss (chronic): Secondary | ICD-10-CM | POA: Diagnosis not present

## 2016-04-06 DIAGNOSIS — Z9889 Other specified postprocedural states: Secondary | ICD-10-CM

## 2016-04-06 HISTORY — PX: LAPAROSCOPIC ASSISTED VAGINAL HYSTERECTOMY: SHX5398

## 2016-04-06 HISTORY — PX: CYSTOSCOPY: SHX5120

## 2016-04-06 LAB — PREGNANCY, URINE: PREG TEST UR: NEGATIVE

## 2016-04-06 SURGERY — HYSTERECTOMY, VAGINAL, LAPAROSCOPY-ASSISTED
Anesthesia: General | Site: Urethra

## 2016-04-06 MED ORDER — LACTATED RINGERS IV SOLN
INTRAVENOUS | Status: DC
  Administered 2016-04-06 (×2): via INTRAVENOUS

## 2016-04-06 MED ORDER — FENTANYL CITRATE (PF) 250 MCG/5ML IJ SOLN
INTRAMUSCULAR | Status: AC
  Filled 2016-04-06: qty 5

## 2016-04-06 MED ORDER — OXYCODONE-ACETAMINOPHEN 5-325 MG PO TABS
1.0000 | ORAL_TABLET | ORAL | Status: DC | PRN
  Administered 2016-04-07 (×2): 1 via ORAL
  Filled 2016-04-06 (×2): qty 1

## 2016-04-06 MED ORDER — FENTANYL CITRATE (PF) 100 MCG/2ML IJ SOLN
INTRAMUSCULAR | Status: DC | PRN
  Administered 2016-04-06: 100 ug via INTRAVENOUS
  Administered 2016-04-06: 50 ug via INTRAVENOUS
  Administered 2016-04-06: 100 ug via INTRAVENOUS
  Administered 2016-04-06 (×2): 50 ug via INTRAVENOUS

## 2016-04-06 MED ORDER — ACETAMINOPHEN 160 MG/5ML PO SOLN: 325.0000 mg | ORAL | Status: DC | PRN

## 2016-04-06 MED ORDER — SCOPOLAMINE 1 MG/3DAYS TD PT72
MEDICATED_PATCH | TRANSDERMAL | Status: AC
  Administered 2016-04-06: 1.5 mg via TRANSDERMAL
  Filled 2016-04-06: qty 1

## 2016-04-06 MED ORDER — LIDOCAINE HCL (CARDIAC) 20 MG/ML IV SOLN
INTRAVENOUS | Status: AC
  Filled 2016-04-06: qty 5

## 2016-04-06 MED ORDER — FENTANYL CITRATE (PF) 100 MCG/2ML IJ SOLN
INTRAMUSCULAR | Status: AC
  Filled 2016-04-06: qty 2

## 2016-04-06 MED ORDER — ROCURONIUM BROMIDE 100 MG/10ML IV SOLN
INTRAVENOUS | Status: DC | PRN
  Administered 2016-04-06: 50 mg via INTRAVENOUS

## 2016-04-06 MED ORDER — ROCURONIUM BROMIDE 100 MG/10ML IV SOLN
INTRAVENOUS | Status: AC
  Filled 2016-04-06: qty 1

## 2016-04-06 MED ORDER — LIDOCAINE-EPINEPHRINE 1 %-1:100000 IJ SOLN
INTRAMUSCULAR | Status: AC
  Filled 2016-04-06: qty 1

## 2016-04-06 MED ORDER — LIDOCAINE-EPINEPHRINE 1 %-1:100000 IJ SOLN
INTRAMUSCULAR | Status: DC | PRN
  Administered 2016-04-06: 20 mL

## 2016-04-06 MED ORDER — KETOROLAC TROMETHAMINE 30 MG/ML IJ SOLN
INTRAMUSCULAR | Status: DC | PRN
  Administered 2016-04-06: 30 mg via INTRAVENOUS

## 2016-04-06 MED ORDER — ACETAMINOPHEN 325 MG PO TABS: 325.0000 mg | ORAL_TABLET | ORAL | Status: DC | PRN

## 2016-04-06 MED ORDER — STERILE WATER FOR IRRIGATION IR SOLN
Status: DC | PRN
  Administered 2016-04-06: 1000 mL

## 2016-04-06 MED ORDER — OXYCODONE HCL 5 MG/5ML PO SOLN: 5.0000 mg | Freq: Once | ORAL | Status: DC | PRN

## 2016-04-06 MED ORDER — FUROSEMIDE 10 MG/ML IJ SOLN
INTRAMUSCULAR | Status: AC
  Filled 2016-04-06: qty 2

## 2016-04-06 MED ORDER — ONDANSETRON HCL 4 MG/2ML IJ SOLN
INTRAMUSCULAR | Status: AC
  Filled 2016-04-06: qty 2

## 2016-04-06 MED ORDER — METOCLOPRAMIDE HCL 10 MG PO TABS
10.0000 mg | ORAL_TABLET | Freq: Three times a day (TID) | ORAL | Status: DC
  Administered 2016-04-06 – 2016-04-07 (×2): 10 mg via ORAL
  Filled 2016-04-06 (×2): qty 1

## 2016-04-06 MED ORDER — BUPIVACAINE HCL (PF) 0.25 % IJ SOLN
INTRAMUSCULAR | Status: DC | PRN
  Administered 2016-04-06: 10 mL

## 2016-04-06 MED ORDER — PROPOFOL 10 MG/ML IV BOLUS
INTRAVENOUS | Status: DC | PRN
  Administered 2016-04-06: 200 mg via INTRAVENOUS

## 2016-04-06 MED ORDER — SUGAMMADEX SODIUM 200 MG/2ML IV SOLN
INTRAVENOUS | Status: AC
  Filled 2016-04-06: qty 2

## 2016-04-06 MED ORDER — ONDANSETRON HCL 4 MG/2ML IJ SOLN: 4.0000 mg | Freq: Four times a day (QID) | INTRAMUSCULAR | Status: DC | PRN

## 2016-04-06 MED ORDER — DIPHENHYDRAMINE HCL 12.5 MG/5ML PO ELIX: 12.5000 mg | ORAL_SOLUTION | Freq: Four times a day (QID) | ORAL | Status: DC | PRN

## 2016-04-06 MED ORDER — NALOXONE HCL 0.4 MG/ML IJ SOLN: 0.4000 mg | INTRAMUSCULAR | Status: DC | PRN

## 2016-04-06 MED ORDER — PROPOFOL 10 MG/ML IV BOLUS
INTRAVENOUS | Status: AC
  Filled 2016-04-06: qty 20

## 2016-04-06 MED ORDER — FENTANYL CITRATE (PF) 100 MCG/2ML IJ SOLN
25.0000 ug | INTRAMUSCULAR | Status: DC | PRN
  Administered 2016-04-06 (×2): 50 ug via INTRAVENOUS

## 2016-04-06 MED ORDER — IBUPROFEN 800 MG PO TABS
800.0000 mg | ORAL_TABLET | Freq: Four times a day (QID) | ORAL | Status: DC | PRN
  Administered 2016-04-06 – 2016-04-07 (×2): 800 mg via ORAL
  Filled 2016-04-06 (×2): qty 1

## 2016-04-06 MED ORDER — BUPIVACAINE HCL (PF) 0.25 % IJ SOLN
INTRAMUSCULAR | Status: AC
  Filled 2016-04-06: qty 30

## 2016-04-06 MED ORDER — GLYCOPYRROLATE 0.2 MG/ML IJ SOLN
INTRAMUSCULAR | Status: AC
  Filled 2016-04-06: qty 1

## 2016-04-06 MED ORDER — DIPHENHYDRAMINE HCL 50 MG/ML IJ SOLN: 12.5000 mg | Freq: Four times a day (QID) | INTRAMUSCULAR | Status: DC | PRN

## 2016-04-06 MED ORDER — DEXAMETHASONE SODIUM PHOSPHATE 10 MG/ML IJ SOLN
INTRAMUSCULAR | Status: DC | PRN
  Administered 2016-04-06: 8 mg via INTRAVENOUS

## 2016-04-06 MED ORDER — SODIUM CHLORIDE 0.9% FLUSH: 9.0000 mL | INTRAVENOUS | Status: DC | PRN

## 2016-04-06 MED ORDER — SUGAMMADEX SODIUM 200 MG/2ML IV SOLN
INTRAVENOUS | Status: DC | PRN
  Administered 2016-04-06: 154.4 mg via INTRAVENOUS

## 2016-04-06 MED ORDER — HYDROMORPHONE 1 MG/ML IV SOLN
INTRAVENOUS | Status: DC
  Administered 2016-04-06: 1.6 mg via INTRAVENOUS
  Administered 2016-04-06: 2 mg via INTRAVENOUS
  Administered 2016-04-06: 14:00:00 via INTRAVENOUS
  Filled 2016-04-06: qty 25

## 2016-04-06 MED ORDER — OXYCODONE HCL 5 MG PO TABS: 5.0000 mg | ORAL_TABLET | Freq: Once | ORAL | Status: DC | PRN

## 2016-04-06 MED ORDER — MIDAZOLAM HCL 2 MG/2ML IJ SOLN
INTRAMUSCULAR | Status: DC | PRN
  Administered 2016-04-06: 2 mg via INTRAVENOUS

## 2016-04-06 MED ORDER — DEXAMETHASONE SODIUM PHOSPHATE 10 MG/ML IJ SOLN
INTRAMUSCULAR | Status: AC
  Filled 2016-04-06: qty 1

## 2016-04-06 MED ORDER — LACTATED RINGERS IV SOLN
INTRAVENOUS | Status: DC
  Administered 2016-04-06: 125 mL/h via INTRAVENOUS
  Administered 2016-04-06 (×2): via INTRAVENOUS

## 2016-04-06 MED ORDER — LEVOTHYROXINE SODIUM 137 MCG PO TABS
137.0000 ug | ORAL_TABLET | Freq: Every day | ORAL | Status: DC
  Administered 2016-04-07: 137 ug via ORAL
  Filled 2016-04-06 (×2): qty 1

## 2016-04-06 MED ORDER — MIDAZOLAM HCL 2 MG/2ML IJ SOLN
INTRAMUSCULAR | Status: AC
  Filled 2016-04-06: qty 2

## 2016-04-06 MED ORDER — LACTATED RINGERS IR SOLN
Status: DC | PRN
  Administered 2016-04-06: 3000 mL

## 2016-04-06 MED ORDER — GLYCOPYRROLATE 0.2 MG/ML IJ SOLN
INTRAMUSCULAR | Status: DC | PRN
  Administered 2016-04-06: 0.2 mg via INTRAVENOUS

## 2016-04-06 MED ORDER — LIDOCAINE HCL (CARDIAC) 20 MG/ML IV SOLN
INTRAVENOUS | Status: DC | PRN
  Administered 2016-04-06: 60 mg via INTRAVENOUS

## 2016-04-06 MED ORDER — SCOPOLAMINE 1 MG/3DAYS TD PT72
1.0000 | MEDICATED_PATCH | Freq: Once | TRANSDERMAL | Status: DC
  Administered 2016-04-06: 1.5 mg via TRANSDERMAL

## 2016-04-06 MED ORDER — ONDANSETRON HCL 4 MG/2ML IJ SOLN
INTRAMUSCULAR | Status: DC | PRN
  Administered 2016-04-06: 4 mg via INTRAVENOUS

## 2016-04-06 SURGICAL SUPPLY — 53 items
APL SRG 38 LTWT LNG FL B (MISCELLANEOUS) ×2
APPLICATOR ARISTA FLEXITIP XL (MISCELLANEOUS) ×1 IMPLANT
CABLE HIGH FREQUENCY MONO STRZ (ELECTRODE) IMPLANT
CLOTH BEACON ORANGE TIMEOUT ST (SAFETY) ×3 IMPLANT
CONT PATH 16OZ SNAP LID 3702 (MISCELLANEOUS) ×3 IMPLANT
COVER BACK TABLE 60X90IN (DRAPES) ×3 IMPLANT
COVER MAYO STAND STRL (DRAPES) ×3 IMPLANT
DECANTER SPIKE VIAL GLASS SM (MISCELLANEOUS) ×2 IMPLANT
DRSG COVADERM PLUS 2X2 (GAUZE/BANDAGES/DRESSINGS) ×4 IMPLANT
DRSG OPSITE POSTOP 3X4 (GAUZE/BANDAGES/DRESSINGS) ×1 IMPLANT
DURAPREP 26ML APPLICATOR (WOUND CARE) ×3 IMPLANT
ELECT REM PT RETURN 9FT ADLT (ELECTROSURGICAL) ×3
ELECTRODE REM PT RTRN 9FT ADLT (ELECTROSURGICAL) IMPLANT
EVACUATOR SMOKE 8.L (FILTER) ×3 IMPLANT
GLOVE BIOGEL PI IND STRL 7.0 (GLOVE) ×6 IMPLANT
GLOVE BIOGEL PI IND STRL 8 (GLOVE) ×2 IMPLANT
GLOVE BIOGEL PI INDICATOR 7.0 (GLOVE) ×3
GLOVE BIOGEL PI INDICATOR 8 (GLOVE) ×3
GLOVE ECLIPSE 7.5 STRL STRAW (GLOVE) ×7 IMPLANT
HEMOSTAT ARISTA ABSORB 3G PWDR (MISCELLANEOUS) ×1 IMPLANT
LEGGING LITHOTOMY PAIR STRL (DRAPES) ×2 IMPLANT
LIQUID BAND (GAUZE/BANDAGES/DRESSINGS) ×1 IMPLANT
NS IRRIG 1000ML POUR BTL (IV SOLUTION) ×3 IMPLANT
PACK LAVH (CUSTOM PROCEDURE TRAY) ×3 IMPLANT
PACK ROBOTIC GOWN (GOWN DISPOSABLE) ×3 IMPLANT
PAD TRENDELENBURG POSITION (MISCELLANEOUS) ×3 IMPLANT
SET CYSTO W/LG BORE CLAMP LF (SET/KITS/TRAYS/PACK) ×1 IMPLANT
SET IRRIG TUBING LAPAROSCOPIC (IRRIGATION / IRRIGATOR) ×3 IMPLANT
SHEARS HARMONIC ACE PLUS 36CM (ENDOMECHANICALS) ×3 IMPLANT
SLEEVE XCEL OPT CAN 5 100 (ENDOMECHANICALS) ×3 IMPLANT
STRIP CLOSURE SKIN 1/4X3 (GAUZE/BANDAGES/DRESSINGS) IMPLANT
SUT VIC AB 0 CT1 18XCR BRD8 (SUTURE) ×4 IMPLANT
SUT VIC AB 0 CT1 27 (SUTURE)
SUT VIC AB 0 CT1 27XBRD ANBCTR (SUTURE) IMPLANT
SUT VIC AB 0 CT1 36 (SUTURE) ×3 IMPLANT
SUT VIC AB 0 CT1 8-18 (SUTURE) ×6
SUT VIC AB 2-0 SH 27 (SUTURE)
SUT VIC AB 2-0 SH 27XBRD (SUTURE) IMPLANT
SUT VIC AB 3-0 CT1 27 (SUTURE)
SUT VIC AB 3-0 CT1 TAPERPNT 27 (SUTURE) IMPLANT
SUT VIC AB 3-0 SH 27 (SUTURE)
SUT VIC AB 3-0 SH 27X BRD (SUTURE) IMPLANT
SUT VICRYL 0 TIES 12 18 (SUTURE) ×3 IMPLANT
SUT VICRYL 0 UR6 27IN ABS (SUTURE) ×3 IMPLANT
SUT VICRYL RAPIDE 3 0 (SUTURE) ×5 IMPLANT
SYR BULB IRRIGATION 50ML (SYRINGE) ×2 IMPLANT
TOWEL OR 17X24 6PK STRL BLUE (TOWEL DISPOSABLE) ×6 IMPLANT
TRAY FOLEY CATH SILVER 14FR (SET/KITS/TRAYS/PACK) ×3 IMPLANT
TROCAR BALLN 12MMX100 BLUNT (TROCAR) ×1 IMPLANT
TROCAR XCEL NON-BLD 11X100MML (ENDOMECHANICALS) ×3 IMPLANT
TROCAR XCEL NON-BLD 5MMX100MML (ENDOMECHANICALS) ×3 IMPLANT
WARMER LAPAROSCOPE (MISCELLANEOUS) ×3 IMPLANT
WATER STERILE IRR 1000ML POUR (IV SOLUTION) ×3 IMPLANT

## 2016-04-06 NOTE — H&P (View-Only) (Signed)
Hematology and Oncology Follow Up Visit  Miranda Fowler [id number] [birth year] 35 y.o. 03/10/2016 3:24 PM  CC: Miranda Crafts, DO    Principle Diagnosis:  A 35 year old female with iron-deficiency anemia diagnosed in August 2009 who presented with hemoglobin of 7.2, ferritin of 3. Her iron deficiency is related due to menorrhagia.  Prior Therapy: She is status post IV iron infusion on few occasions most recent switch in January 2017.  Current therapy: Oral iron therapy which she have not taken consistently because of poor tolerance.  Interim History: Miranda Fowler presents today for a followup visit. Since the last visit, she received IV iron infusion on tolerated it well. She felt some energy improvement and activity level. However, she did report some slight decline as of late including more lethargy and sleepiness. She has been on Depo-Provera which have helped decrease her heavy menses and have not had any menorrhagia. She denied any hematochezia or melena. She is scheduled to have a hysterectomy on June 20.  She does not report any headaches, blurry vision, syncope or seizures. She does not report any fevers, chills or sweats. She does not report any cough, wheezing or hemoptysis. Does not report any nausea, vomiting or abdominal pain. She does not report any hematochezia, melena or hematemesis. She does not report any frequency urgency or hesitancy. She does not report any skeletal complaints. Remaining review of systems unremarkable.  Medications: I have reviewed the patient's current medications.  Current outpatient prescriptions:  .  cetirizine (ZYRTEC ALLERGY) 10 MG tablet, Take 1 tablet (10 mg total) by mouth daily., Disp: 30 tablet, Rfl: 5 .  ibuprofen (ADVIL,MOTRIN) 800 MG tablet, Take 1 tablet (800 mg total) by mouth every 8 (eight) hours as needed., Disp: 30 tablet, Rfl: 0 .  medroxyPROGESTERone (DEPO-PROVERA) 150 MG/ML injection, Inject 1 mL (150 mg total) into the muscle every  3 (three) months., Disp: 1 mL, Rfl: 3 .  meloxicam (MOBIC) 15 MG tablet, Take 1 tablet (15 mg total) by mouth daily as needed for pain., Disp: 30 tablet, Rfl: 3 .  SYNTHROID 137 MCG tablet, Take 1 tablet (137 mcg total) by mouth daily before breakfast., Disp: 30 tablet, Rfl: 2  Allergies: No Known Allergies  Past Medical History, Surgical history, Social history, and Family History were reviewed and updated.   Physical Exam: Blood pressure 130/71, pulse 67, temperature 98.1 F (36.7 C), temperature source Oral, resp. rate 18, height 5\' 4"  (1.626 m), weight 172 lb 6.4 oz (78.2 kg), SpO2 100 %. ECOG: 0 General appearance: alert, awake woman without distress. Head: Normocephalic, without obvious abnormality no oral ulcers or lesions. Neck: no adenopathy Lymph nodes: Cervical, supraclavicular, and axillary nodes normal. Heart:regular rate and rhythm, S1, S2 normal, no murmur, click, rub or gallop Lung:chest clear, no wheezing, rales, normal symmetric air entry Abdomen: soft, non-tender, without masses or organomegaly no rebound or guarding. EXT:no erythema, induration, or nodules   Lab Results: Lab Results  Component Value Date   WBC 5.3 03/10/2016   HGB 11.8 03/10/2016   HCT 35.3 03/10/2016   MCV 100.7 03/10/2016   PLT 259 03/10/2016     Chemistry      Component Value Date/Time   NA 140 02/09/2016 0001   NA 140 11/02/2013 1336   K 4.0 02/09/2016 0001   K 3.8 11/02/2013 1336   CL 106 02/09/2016 0001   CL 108* 12/14/2012 1342   CO2 25 02/09/2016 0001   CO2 27 11/02/2013 1336   BUN 16 02/09/2016  0001   BUN 9.1 11/02/2013 1336   CREATININE 0.83 02/09/2016 0001   CREATININE 1.18* 11/08/2015 1020   CREATININE 0.8 11/02/2013 1336      Component Value Date/Time   CALCIUM 9.3 02/09/2016 0001   CALCIUM 8.9 11/02/2013 1336   ALKPHOS 45 02/09/2016 0001   ALKPHOS 47 11/02/2013 1336   AST 21 02/09/2016 0001   AST 16 11/02/2013 1336   ALT 19 02/09/2016 0001   ALT 8 11/02/2013  1336   BILITOT 0.4 02/09/2016 0001   BILITOT 0.36 11/02/2013 1336      Impression and Plan:  A 35 year old female with the following issues:   1. Iron-deficiency anemia. This is likely related to have menorrhagia dating back to 2009. She Is status post IV iron infusion on multiple occasions most recent of which in January 2017. Her hemoglobin has normalized at this time although her iron studies are pending. She is becoming more symptomatic indicating the possible development of worsening iron deficiency again. She is scheduled to have surgery in the next 4 weeks and in the anticipation of low iron we will schedule her IV iron infusion in the next couple weeks in case her iron studies are low. We wanted to ensure adequate hemoglobin and preparation for her hysterectomy. After her hysterectomy, she might not require further IV iron replacement. 2. Menorrhagia. She received Depo-Provera to decrease her menses which have helped at this time.   She is scheduled to have a hysterectomy for definitive treatment for menorrhagia. 3. Follow-up. In 4 months.  Miranda Fowler 5/24/20173:24 PM

## 2016-04-06 NOTE — Anesthesia Postprocedure Evaluation (Signed)
Anesthesia Post Note  Patient: Miranda Fowler  Procedure(s) Performed: Procedure(s) (LRB): LAPAROSCOPIC ASSISTED VAGINAL HYSTERECTOMY,  Bilateral Salpingectomy (N/A) CYSTOSCOPY (N/A)  Patient location during evaluation: [facility] Anesthesia Type: General Level of consciousness: awake, awake and alert and oriented Pain management: pain level controlled Vital Signs Assessment: post-procedure vital signs reviewed and stable Respiratory status: spontaneous breathing, nonlabored ventilation and respiratory function stable Cardiovascular status: blood pressure returned to baseline and stable Postop Assessment: no headache, no backache, patient able to bend at knees, no signs of nausea or vomiting and adequate PO intake Anesthetic complications: no     Last Vitals:  Filed Vitals:   04/06/16 1700 04/06/16 1708  BP:  114/62  Pulse:  56  Temp:  36.7 C  Resp: 15 18    Last Pain:  Filed Vitals:   04/06/16 1709  PainSc: 5    Pain Goal: Patients Stated Pain Goal: 3 (04/06/16 1700)               [name]

## 2016-04-06 NOTE — Interval H&P Note (Signed)
History and Physical Interval Note:  04/06/2016 10:18 AM  Miranda Fowler  has presented today for surgery, with the diagnosis of MENORRHAGIA, ANEMIA  The various methods of treatment have been discussed with the patient and family. After consideration of risks, benefits and other options for treatment, the patient has consented to  Procedure(s) with comments: LAPAROSCOPIC ASSISTED VAGINAL HYSTERECTOMY (N/A) - REQUEST TO FOLLOW  REQUESTS 2 1/2 HOURS OR TIME  needs harmonic scalpel CYSTOSCOPY (N/A) as a surgical intervention .  The patient's history has been reviewed, patient examined, no change in status, stable for surgery.  I have reviewed the patient's chart and labs.  Questions were answered to the patient's satisfaction.     Terrance Mass

## 2016-04-06 NOTE — Addendum Note (Signed)
Addendum  created 04/06/16 1929 by [name], CRNA   Modules edited: Clinical Notes   Clinical Notes:  File: [id number]

## 2016-04-06 NOTE — Anesthesia Postprocedure Evaluation (Signed)
Anesthesia Post Note  Patient: Miranda Fowler  Procedure(s) Performed: Procedure(s) (LRB): LAPAROSCOPIC ASSISTED VAGINAL HYSTERECTOMY,  Bilateral Salpingectomy (N/A) CYSTOSCOPY (N/A)  Patient location during evaluation: PACU Anesthesia Type: General Level of consciousness: awake and alert Pain management: pain level controlled Vital Signs Assessment: post-procedure vital signs reviewed and stable Respiratory status: spontaneous breathing, nonlabored ventilation, respiratory function stable and patient connected to nasal cannula oxygen Cardiovascular status: blood pressure returned to baseline and stable Postop Assessment: no signs of nausea or vomiting Anesthetic complications: no     Last Vitals:  Filed Vitals:   04/06/16 1345 04/06/16 1400  BP: 116/75 116/69  Pulse: 82 58  Temp:  36.8 C  Resp: 17 16    Last Pain:  Filed Vitals:   04/06/16 1407  PainSc: 3    Pain Goal: Patients Stated Pain Goal: 3 (04/06/16 1400)               Beyza Bellino J

## 2016-04-06 NOTE — Transfer of Care (Signed)
Immediate Anesthesia Transfer of Care Note  Patient: Miranda Fowler  Procedure(s) Performed: Procedure(s): LAPAROSCOPIC ASSISTED VAGINAL HYSTERECTOMY,  Bilateral Salpingectomy (N/A) CYSTOSCOPY (N/A)  Patient Location: PACU  Anesthesia Type:General  Level of Consciousness: awake, alert  and oriented  Airway & Oxygen Therapy: Patient Spontanous Breathing and Patient connected to nasal cannula oxygen  Post-op Assessment: Report given to RN and Post -op Vital signs reviewed and stable  Post vital signs: Reviewed and stable  Last Vitals:  Filed Vitals:   04/06/16 0915  BP: 126/62  Pulse: 60  Temp: 36.7 C  Resp: 16    Last Pain: There were no vitals filed for this visit.    Patients Stated Pain Goal: 3 ([phone])  Complications: No apparent anesthesia complications

## 2016-04-06 NOTE — Op Note (Signed)
Operative Note  04/06/2016  1:12 PM  PATIENT:  Miranda Fowler  35 y.o. female  PRE-OPERATIVE DIAGNOSIS:  MENORRHAGIA, ANEMIA  POST-OPERATIVE DIAGNOSIS:  MENORRHAGIA, ANEMIA, pelvic adhesions  PROCEDURE:  Procedure(s): LAPAROSCOPIC ASSISTED VAGINAL HYSTERECTOMY,  Bilateral Salpingectomy CYSTOSCOPY  SURGEON:  Surgeon(s): Terrance Mass, MD Anastasio Auerbach, MD  ANESTHESIA:   general  FINDINGS: Upper limits of normal uterus fundus adhered to the left pelvic sidewall and anterior abdominal wall. Lower uterine segment scarring from previous C-sections. A right fallopian tube Hulka clip was noted. Left open tube with presence of a partial transection. Cul-de-sac. No endometriosis no adhesions. Normal liver surface. Appendix not visualized  DESCRIPTION OF OPERATION:After adequate general endotracheal anesthesia, the patient was placed in dorsal lithotomy position, prepped and draped in the usual manner for a laparoscopic procedure. Patient received her prophylactic antibiotic and had PAS stockings in place as well.  A time out was undertaken for proper identification of the patient and procedure to be performed. A speculum was placed into the vagina. A bimanual exam was performed and then then  A single tooth tenaculum was utilized to grasp the anterior lip of the uterine cervix. The uterus was sounded to  7-1/2 cm The single-tooth tenaculum and speculum were removed from the vagina. A foley catheter was inserted to monitor urinary output.  At this time, an infraumbilical  skin incision was made through which a 10/11-mm  Opti-View trocar was introduced through the infraumbilical incision into the abdominal cavity. A pneumoperitoneum was established  With CO2 for approximately 2 1/2 liters. Through the trocar sheath, the laparoscope was inserted and adequate visualization of the pelvic structures was noted. Two  5-mm skin incision were made on the patients right and left lower abdomin under  laparoscopic guidance and  two  5-mm trocars were  introduced into the abdominal cavity for instrumentation. Evaluation of the pelvis revealed :  Upper limits of normal uterus fundus adhered to the left pelvic sidewall and anterior abdominal wall. Lower uterine segment scarring from previous C-sections. A right fallopian tube Hulka clip was noted. Left open tube with presence of a partial transection. Cul-de-sac. No endometriosis no adhesions. Normal liver surface. Appendix not visualized    . The ureters were noted to be deep in the pelvis. At this time, the right cornu was grasped and the right fallopian tube, uteroovarian ligament, and round ligaments were coaptated and transected  With the Harmonic Scalpel without difficulty. The remainder of the uterine vessels and anterior and posterior leaves of the broad ligament, as well as the cardinal ligaments were  transected in a serial fashion down to level of the uterine artery. Attention was then placed into the left adnexa. Due to the fact that the uterine fundus was adhered to the anterior abdominal wall meticulous dissection and with utilization of the harmonics scalpel and was able to be freed in an effort to transect the round ligament.The left fallopian tube, uteroovarian ligament, and round ligaments were doubly  coaptated and transected with the Harmonic Scalpel. The remainder of the cardinal ligament, uterine vessels, anterior, and posterior sheaths of the broad ligament were coaptated  and transected with the Harmonic Scalpel  in a serial manner to the level of the uterine artery. The anterior leaf of the broad ligament was then dissected to the midline bilaterally, establishing a bladder flap with a combination of blunt and sharp dissection. At this time, attention was made to the vaginal hysterectomy. The laparoscope was removed and attention was  made to the vaginal hysterectomy.  Hulka Tenaculum  was removed and the anterior and posterior leafs of  the cervix were grasped with Lahey tenaculum. A circumferential injection with  1% Lidocaine with 1:100,000  Epinephrine dilution  Was injected into  the cervicovaginal portio for 20 cc's. . A circumferential incision was then made at the cervicovaginal portio. The anterior and posterior colpotomies were accomplished with a combination of blunt and sharp dissection without difficulty. The right uterosacral ligament was clamped, transected, and ligated with #0 Vicryl sutures. The left uterosacral ligament was clamped, transected, and ligated with #0 Vicryl suture. The parametrial tissue was then clamped bilaterally, transected, and ligated with #0 Vicryl suture bilaterally. The uterus along with cervix and bilateral fallopian tubes and Hulka clip was then removed and passed off the operative field. Laparotomy pack was placed into the pelvis. The pedicles were evaluated. There was no bleeding noted; therefore, the laparotomy pack was removed. The uterosacral ligaments were suture fixated into the vaginal cuff angles with #0 Vicryl sutures. The vaginal cuff was then closed Hemostasis was noted throughout. The posterior peritoneum was secured to the vagina with a running stitch of 0-Vicryl from 3-9 o'clock. The vaginal cuff was then closed with interrupted figure of eights with 0-Vicryl.  At this point the Foley catheter was removed and a 70 cystoscope was introduced into the urethra into the bladder and a thorough inspection demonstrated intact bladder mucosa and both ureteral orifices were ejecting urine.   At this time, the laparoscope was reinserted into the abdomen. The abdomen was reinsufflated. Evaluation revealed no further bleeding. Irrigation with sterile water was performed and again no bleeding was noted. Arista hemostatic agent was applied at the raw surfaces of the vaginal cuff. The  5 mm trocar sheaths  were then removed under laparoscopic visualization. The laparoscope was removed. The carbon  dioxide was allowed to escape from the abdomen and the infraumbilical trocar sheath was then removed. The skin incisions were closed with 0-vicryl at the subumbilical incision site and the skin as well as the 5 mm ports were reapproximated with Dermabon Glue. Neosporin and small dressings applied. There were no complications. The instrument, sponge, and needle counts were correct. PAtient was extubated and transferred to recovery room.    ESTIMATED BLOOD LOSS: 300 cc   Intake/Output Summary (Last 24 hours) at 04/06/16 1312 Last data filed at 04/06/16 1301  Gross per 24 hour  Intake   2000 ml  Output    800 ml  Net   1200 ml     BLOOD ADMINISTERED:none   LOCAL MEDICATIONS USED:  MARCAINE   0.25% applied to all 3 incision ports total 10 cc. Paracervical block 1% lidocaine with 1 100,000 epinephrine was infiltrated into the cervical vaginal stroma for a total of 20 cc  SPECIMEN:  Source of Specimen:  Uterus, cervix, bilateral fallopian tubes  DISPOSITION OF SPECIMEN:  PATHOLOGY  COUNTS:  YES  PLAN OF CARE: Transfer to [facility] HMD1:12 PMTD@

## 2016-04-06 NOTE — Anesthesia Preprocedure Evaluation (Signed)
Anesthesia Evaluation  Patient identified by MRN, date of birth, ID band Patient awake    Reviewed: Allergy & Precautions, NPO status , Patient's Chart, lab work & pertinent test results  History of Anesthesia Complications Negative for: history of anesthetic complications  Airway Mallampati: II  TM Distance: >3 FB Neck ROM: Full    Dental  (+) Teeth Intact   Pulmonary neg pulmonary ROS,    breath sounds clear to auscultation       Cardiovascular negative cardio ROS   Rhythm:Regular     Neuro/Psych  Headaches, neg Seizures negative psych ROS   GI/Hepatic negative GI ROS, Neg liver ROS,   Endo/Other  Hypothyroidism   Renal/GU negative Renal ROS     Musculoskeletal  (+) Arthritis ,   Abdominal   Peds  Hematology  (+) anemia ,   Anesthesia Other Findings   Reproductive/Obstetrics                             Anesthesia Physical Anesthesia Plan  ASA: II  Anesthesia Plan: General   Post-op Pain Management:    Induction: Intravenous  Airway Management Planned: Oral ETT  Additional Equipment: None  Intra-op Plan:   Post-operative Plan: Extubation in OR  Informed Consent: I have reviewed the patients History and Physical, chart, labs and discussed the procedure including the risks, benefits and alternatives for the proposed anesthesia with the patient or authorized representative who has indicated his/her understanding and acceptance.   Dental advisory given  Plan Discussed with: CRNA and Surgeon  Anesthesia Plan Comments:         Anesthesia Quick Evaluation

## 2016-04-06 NOTE — Anesthesia Procedure Notes (Signed)
Procedure Name: Intubation Date/Time: 04/06/2016 10:58 AM Performed by: Rayvon Char Pre-anesthesia Checklist: Patient identified, Emergency Drugs available, Suction available and Patient being monitored Patient Re-evaluated:Patient Re-evaluated prior to inductionOxygen Delivery Method: Circle system utilized Preoxygenation: Pre-oxygenation with 100% oxygen Intubation Type: IV induction Ventilation: Mask ventilation without difficulty Laryngoscope Size: Miller and 2 Grade View: Grade II Tube type: Oral Tube size: 7.0 mm Number of attempts: 1 Airway Equipment and Method: Stylet Placement Confirmation: ETT inserted through vocal cords under direct vision,  positive ETCO2 and breath sounds checked- equal and bilateral Secured at: 22 cm Tube secured with: Tape Dental Injury: Teeth and Oropharynx as per pre-operative assessment

## 2016-04-07 ENCOUNTER — Encounter (HOSPITAL_COMMUNITY): Payer: Self-pay | Admitting: Gynecology

## 2016-04-07 DIAGNOSIS — N800 Endometriosis of uterus: Secondary | ICD-10-CM | POA: Diagnosis not present

## 2016-04-07 DIAGNOSIS — N920 Excessive and frequent menstruation with regular cycle: Secondary | ICD-10-CM | POA: Diagnosis not present

## 2016-04-07 DIAGNOSIS — D509 Iron deficiency anemia, unspecified: Secondary | ICD-10-CM | POA: Diagnosis not present

## 2016-04-07 DIAGNOSIS — N946 Dysmenorrhea, unspecified: Secondary | ICD-10-CM | POA: Diagnosis not present

## 2016-04-07 LAB — CBC
HEMATOCRIT: 31.2 % — AB (ref 36.0–46.0)
HEMOGLOBIN: 10.9 g/dL — AB (ref 12.0–15.0)
MCH: 34 pg (ref 26.0–34.0)
MCHC: 34.9 g/dL (ref 30.0–36.0)
MCV: 97.2 fL (ref 78.0–100.0)
PLATELETS: 240 10*3/uL (ref 150–400)
RBC: 3.21 MIL/uL — AB (ref 3.87–5.11)
RDW: 11.6 % (ref 11.5–15.5)
WBC: 8.5 10*3/uL (ref 4.0–10.5)

## 2016-04-07 NOTE — Discharge Summary (Signed)
Physician Discharge Summary  Patient ID: Miranda Fowler MRN: [id number] DOB/AGE: 35/05/1981 35 y.o.  Admit date: 04/06/2016 Discharge date: 04/07/2016  Admission Diagnoses:Chronic anemia, dysmenorrhea, pelvic pain, multiple pelvic surgeries  Discharge Diagnoses: Normal-appearing uterus, Falope ring on right tube. Partial resection of left fallopian tube Active Problems:   Postoperative state   Discharged Condition: good  Hospital Course: Patient was admitted to the hospital on 04/06/2016 whereby she underwent a laparoscopic-assisted vaginal hysterectomy with bilateral salpingectomy. Patient tolerated procedure well. Patient had PCA pump with Dilaudid overnight was discontinued 0500 hrs. along with her Foley catheter. Since then she has voided spontaneously. Her vital signs of been stable. She has been ambulating tolerating regular diet and is rated be discharged home.  Consults: None  Significant Diagnostic Studies: labs: Preoperative hemoglobin 11.6 postop hemoglobin 10.9  Treatments: surgery: Laparoscopic-assisted vaginal hysterectomy with bilateral salpingectomy and cystoscopy  Discharge Exam: Blood pressure 113/68, pulse 69, temperature 98.4 F (36.9 C), temperature source Oral, resp. rate 12, height 5\' 2"  (1.575 m), weight 170 lb (77.111 kg), SpO2 100 %. General appearance: alert and cooperative Resp: clear to auscultation bilaterally GI: soft, non-tender; bowel sounds normal; no masses,  no organomegaly Extremities: extremities normal, atraumatic, no cyanosis or edema Incision/Wound: Incision ports intact  Disposition: 01-Home or Self Care  Discharge Instructions    Call MD for:  difficulty breathing, headache or visual disturbances    Complete by:  As directed      Call MD for:  hives    Complete by:  As directed      Call MD for:  persistant dizziness or light-headedness    Complete by:  As directed      Call MD for:  persistant nausea and vomiting    Complete by:   As directed      Call MD for:  redness, tenderness, or signs of infection (pain, swelling, redness, odor or green/yellow discharge around incision site)    Complete by:  As directed      Call MD for:  severe uncontrolled pain    Complete by:  As directed      Call MD for:  temperature >100.4    Complete by:  As directed      Diet general    Complete by:  As directed      Discharge instructions    Complete by:  As directed   Postop appointment with Dr. Toney Rakes on Monday, July 3 at 2:30 PM Hysterectomy, Abdominal & Vaginal Care After Refer to this sheet in the next few weeks. These discharge instructions provide you with general information on caring for yourself after you leave the hospital. Your caregiver may also give you specific instructions. Your treatment has been planned according to the most current medical practices available, but unavoidable complications sometimes occur. If you have any problems or questions after discharge, please call your caregiver. HOME CARE INSTRUCTIONS Healing will take time. You will have tenderness at the surgery site. There may be some swelling and bruising around this area if you had an abdominal hysterectomy. Have an adult stay with you the first 48 to 72 hours after surgery, and then for 1 to 2 weeks afterward to help with daily activities. Only take over-the-counter or prescription medicines for pain, discomfort, or fever as directed by your caregiver.  Do not take aspirin. It can cause bleeding.  Do not drive when taking pain medicine.  It will be normal to be sore for a couple weeks after surgery. See your  caregiver if this seems to be getting worse rather than better.  Follow your caregiver's advice regarding diet, exercise, lifting, driving, and general activities.  Take showers instead of baths for a few weeks as directed.  You may resume your usual diet as directed.  Get plenty of rest and sleep.  Do not douche, use tampons, or have sexual  intercourse until your caregiver says it is okay.  Change your bandages (dressings) as directed if you had an abdominal hysterectomy.  Take your temperature twice a day and write it down.  Do not drink alcohol until your caregiver says it is okay.  If you develop constipation, you may take a mild laxative with your caregiver's permission. Eating bran foods helps with constipation problems. Drink enough water and fluids to keep your urine clear or pale yellow.  Do not sign any legal documents until you feel normal again.  Keep all of your follow-up appointments.  Make sure you and your family understand everything about your operation and recovery.  SEEK MEDICAL CARE IF: There is swelling, redness, or increasing pain in the wound area.  Fluid (pus) is coming from the wound.  You notice a bad smell coming from the wound or surgical dressing.  You have pain, redness, and swelling from the intravenous (IV) site.  The wound breaks open.  You feel dizzy.  You develop pain or bleeding when you urinate.  You develop diarrhea.  You feel sick to your stomach (nauseous) and throw up (vomit).  You develop abnormal vaginal discharge.  You develop a rash.  You have any type of abnormal reaction or develop an allergy to your medicine.  Your pain medicine is not relieving your pain.  seek immediate medical care if: You have an oral temperature above 100, not controlled by medicine. HYYou develop chest pain.  You develop shortness of breath.  You pass out (faint).  You develop pain, swelling, or redness of your leg.  You develop heavy vaginal bleeding, with or without blood clots.  MAKE SURE YOU: Understand these instructions.  Will watch your condition.  Will get help right away if you are not doing well or get worse.  Document Released: 12/25/2003 Document Re-Released: 03/24/2010 [facility] Patient Information 2011 [place].   [employer] HMD8:36 AMTD@     Driving Restrictions     Complete by:  As directed   1 week     Increase activity slowly    Complete by:  As directed      Lifting restrictions    Complete by:  As directed   6 weeks     Sexual Activity Restrictions    Complete by:  As directed   6 weeks            Medication List    STOP taking these medications        medroxyPROGESTERone 150 MG/ML injection  Commonly known as:  DEPO-PROVERA     phenazopyridine 200 MG tablet  Commonly known as:  PYRIDIUM      TAKE these medications        FISH OIL PO  Take 1 capsule by mouth daily.     ibuprofen 800 MG tablet  Commonly known as:  ADVIL,MOTRIN  Take 1 tablet (800 mg total) by mouth every 8 (eight) hours as needed.     meloxicam 15 MG tablet  Commonly known as:  MOBIC  Take 1 tablet (15 mg total) by mouth daily as needed for pain.     metoCLOPramide  10 MG tablet  Commonly known as:  REGLAN  Take 1 tablet (10 mg total) by mouth 3 (three) times daily with meals.     multivitamin with minerals Tabs tablet  Take 1 tablet by mouth daily.     oxyCODONE-acetaminophen 5-325 MG tablet  Commonly known as:  PERCOCET  Take 1 tablet by mouth every 4 (four) hours as needed for severe pain.     SYNTHROID 137 MCG tablet  Generic drug:  levothyroxine  Take 1 tablet (137 mcg total) by mouth daily before breakfast.         Signed: Terrance Mass 04/07/2016, 8:37 AM

## 2016-04-07 NOTE — Progress Notes (Signed)
Pt is discharged per ambulatory with [employer]. Denies pain, heavy vaginal bleeding, or discomfort. No equipment needed for home use. Abdominal lapsites are clean and dry. Stable. Spirits  ared good Stable

## 2016-04-09 ENCOUNTER — Telehealth: Payer: Self-pay | Admitting: *Deleted

## 2016-04-09 DIAGNOSIS — G43809 Other migraine, not intractable, without status migrainosus: Secondary | ICD-10-CM

## 2016-04-09 MED ORDER — IBUPROFEN 800 MG PO TABS: ORAL_TABLET | ORAL | Status: DC

## 2016-04-09 NOTE — Telephone Encounter (Signed)
Pt aware, Rx sent. 

## 2016-04-09 NOTE — Telephone Encounter (Signed)
Please call in prescription for Motrin 800 mg to take 1 by mouth 3 times a day when necessary #30 one refill

## 2016-04-09 NOTE — Telephone Encounter (Signed)
Pt called asking if she could have Rx for motrin 800 mg to take along with the Percocet 5/325, pt had LAVH on 04/06/16, no fever, nor chills, no vomiting, eating and voiding okay. Please advise

## 2016-04-12 ENCOUNTER — Telehealth: Payer: Self-pay

## 2016-04-12 NOTE — Telephone Encounter (Signed)
Patient had LAVH on 04/06/16.  She called today to see if you will sign form for her to get a handicap tag for her car while she recovers.

## 2016-04-12 NOTE — Progress Notes (Signed)
Patient ID: Miranda Fowler, female   DOB: 07-14-1981, 35 y.o.   MRN: [id number] Patient stopped in the office requesting a note for social services stating she had surgery 04/06/16 and will be out of work for 6 weeks. She needed letter today so it was prepared while she waiting and given to her. Copy in in Epic Chart.

## 2016-04-12 NOTE — Telephone Encounter (Signed)
The state would not allow for this after this type of surgery. Sorry

## 2016-04-13 NOTE — Telephone Encounter (Signed)
Patient informed. 

## 2016-04-13 NOTE — Telephone Encounter (Signed)
She can remove them.

## 2016-04-13 NOTE — Telephone Encounter (Signed)
Patient advised.  Dr. JF-Patient asked if she needs to leave the gauze pad on that is covering the incision until she comes for visit?

## 2016-04-16 ENCOUNTER — Telehealth: Payer: Self-pay

## 2016-04-16 NOTE — Telephone Encounter (Signed)
I called patient because earlier today she dropped off an attending physician statement form she needed filled out. It is ready and I let her know.  She asked me if okay to take something for gas pains. She said she is having bowel movements but is having to strain.  She feels sure the pains are only trapped gas. They come and go and are relieved when able to pass gas. She is going to get some Simethicone otc and take as directed. She was in the store when we were speaking and had located it.  She said she feels fine otherwise.

## 2016-04-19 ENCOUNTER — Ambulatory Visit (INDEPENDENT_AMBULATORY_CARE_PROVIDER_SITE_OTHER): Payer: BLUE CROSS/BLUE SHIELD | Admitting: Gynecology

## 2016-04-19 ENCOUNTER — Encounter: Payer: Self-pay | Admitting: Gynecology

## 2016-04-19 VITALS — BP 128/86

## 2016-04-19 DIAGNOSIS — Z09 Encounter for follow-up examination after completed treatment for conditions other than malignant neoplasm: Secondary | ICD-10-CM

## 2016-04-19 MED ORDER — DOCUSATE SODIUM 100 MG PO CAPS: 100.0000 mg | ORAL_CAPSULE | Freq: Two times a day (BID) | ORAL | Status: DC

## 2016-04-19 NOTE — Patient Instructions (Signed)
Docusate capsules What is this medicine? DOCUSATE (doc CUE sayt) is stool softener. It helps prevent constipation and straining or discomfort associated with hard or dry stools. This medicine may be used for other purposes; ask your health care provider or pharmacist if you have questions. What should I tell my health care provider before I take this medicine? They need to know if you have any of these conditions: -nausea or vomiting -severe constipation -stomach pain -sudden change in bowel habit lasting more than 2 weeks -an unusual or allergic reaction to docusate, other medicines, foods, dyes, or preservatives -pregnant or trying to get pregnant -breast-feeding How should I use this medicine? Take this medicine by mouth with a glass of water. Follow the directions on the label. Take your doses at regular intervals. Do not take your medicine more often than directed. Talk to your pediatrician regarding the use of this medicine in children. While this medicine may be prescribed for children as young as 2 years for selected conditions, precautions do apply. Overdosage: If you think you have taken too much of this medicine contact a poison control center or emergency room at once. NOTE: This medicine is only for you. Do not share this medicine with others. What if I miss a dose? If you miss a dose, take it as soon as you can. If it is almost time for your next dose, take only that dose. Do not take double or extra doses. What may interact with this medicine? -mineral oil This list may not describe all possible interactions. Give your health care provider a list of all the medicines, herbs, non-prescription drugs, or dietary supplements you use. Also tell them if you smoke, drink alcohol, or use illegal drugs. Some items may interact with your medicine. What should I watch for while using this medicine? Do not use for more than one week without advice from your doctor or health care  professional. If your constipation returns, check with your doctor or health care professional. Drink plenty of water while taking this medicine. Drinking water helps decrease constipation. Stop using this medicine and contact your doctor or health care professional if you experience any rectal bleeding or do not have a bowel movement after use. These could be signs of a more serious condition. What side effects may I notice from receiving this medicine? Side effects that you should report to your doctor or health care professional as soon as possible: -allergic reactions like skin rash, itching or hives, swelling of the face, lips, or tongue Side effects that usually do not require medical attention (report to your doctor or health care professional if they continue or are bothersome): -diarrhea -stomach cramps -throat irritation This list may not describe all possible side effects. Call your doctor for medical advice about side effects. You may report side effects to FDA at 1-800-FDA-1088. Where should I keep my medicine? Keep out of the reach of children. Store at room temperature between 15 and 30 degrees C (59 and 86 degrees F). Throw away any unused medicine after the expiration date. NOTE: This sheet is a summary. It may not cover all possible information. If you have questions about this medicine, talk to your doctor, pharmacist, or health care provider.    2016, Elsevier/[employer]. (2008-01-25 15:56:49)

## 2016-04-19 NOTE — Progress Notes (Signed)
   Patient is a 35 year old that presented to the office today for her two-week postop visit. Patient status post laparoscopic-assisted vaginal hysterectomy with bilateral salpingectomy and cystoscopy as a result of menorrhagia. Findings from surgery and pathology report as well as pictures were shared with the patient's follows:  FINDINGS: Upper limits of normal uterus fundus adhered to the left pelvic sidewall and anterior abdominal wall. Lower uterine segment scarring from previous C-sections. A right fallopian tube Hulka clip was noted. Left open tube with presence of a partial transection. Cul-de-sac. No endometriosis no adhesions. Normal liver surface. Appendix not visualized  Pathology report: Diagnosis Uterus and cervix, with bilateral fallopian tubes - BENIGN ENDOMETRIUM WITH UNDERLYING MYOMETRIUM DEMONSTRATING SUPERFICIAL ADENOMYOSIS. - BENIGN CERVIX. - BENIGN BILATERAL FALLOPIAN TUBES WITH EVIDENCE OF TUBAL LIGATION. - NO DYSPLASIA, ATYPIA, HYPERPLASIA OR MALIGNANCY IDENTIFIED.  Patient been complaining some constipation on and off. Patient no longer taking narcotic and on no iron supplementation. Her preoperative hemoglobin was 11.6 and postop hemoglobin was 10.9.  Exam: Abdomen: Incision ports intact. Soft nontender no rebound or guarding Pelvic: Bartholin urethra Skene was within normal limits Vagina: No lesions or discharge Vaginal cuff intact Bimanual exam no palpable mass or tenderness Rectal exam not done  Assessment/plan: Patient 2 weeks status post laparoscopic-assisted vaginal history with bilateral salpingectomy doing well. For her constipation I told her to purchase a bottle of magnesium citrate to take today and then begin taking Colace 1 tablet twice a day. Also encouraged to increase her fiber intake and fluid intake. She'll return back to the office in 3 weeks for final postop visit.

## 2016-05-18 ENCOUNTER — Ambulatory Visit (INDEPENDENT_AMBULATORY_CARE_PROVIDER_SITE_OTHER): Payer: BLUE CROSS/BLUE SHIELD | Admitting: Gynecology

## 2016-05-18 ENCOUNTER — Encounter: Payer: Self-pay | Admitting: Gynecology

## 2016-05-18 VITALS — BP 120/70

## 2016-05-18 DIAGNOSIS — D62 Acute posthemorrhagic anemia: Secondary | ICD-10-CM | POA: Diagnosis not present

## 2016-05-18 DIAGNOSIS — Z09 Encounter for follow-up examination after completed treatment for conditions other than malignant neoplasm: Secondary | ICD-10-CM

## 2016-05-18 LAB — CBC
HCT: 35.2 % (ref 35.0–45.0)
Hemoglobin: 11.7 g/dL (ref 11.7–15.5)
MCH: 32.4 pg (ref 27.0–33.0)
MCHC: 33.2 g/dL (ref 32.0–36.0)
MCV: 97.5 fL (ref 80.0–100.0)
MPV: 9.4 fL (ref 7.5–12.5)
PLATELETS: 281 10*3/uL (ref 140–400)
RBC: 3.61 MIL/uL — AB (ref 3.80–5.10)
RDW: 12.7 % (ref 11.0–15.0)
WBC: 4.2 10*3/uL (ref 3.8–10.8)

## 2016-05-18 NOTE — Progress Notes (Signed)
   Patient is a 35 year old presented to the office today for her 6 weeks postop visit. Patient status post laparoscopic-assisted vaginal hysterectomy with bilateral salpingectomy and cystoscopy as a result of menorrhagia. Findings from surgery and pathology report as well as pictures were shared with the patient's follows:  FINDINGS: Upper limits of normal uterus fundus adhered to the left pelvic sidewall and anterior abdominal wall. Lower uterine segment scarring from previous C-sections. A right fallopian tube Hulka clip was noted. Left open tube with presence of a partial transection. Cul-de-sac. No endometriosis no adhesions. Normal liver surface. Appendix not visualized  Pathology report: Diagnosis Uterus and cervix, with bilateral fallopian tubes - BENIGN ENDOMETRIUM WITH UNDERLYING MYOMETRIUM DEMONSTRATING SUPERFICIAL ADENOMYOSIS. - BENIGN CERVIX. - BENIGN BILATERAL FALLOPIAN TUBES WITH EVIDENCE OF TUBAL LIGATION. - NO DYSPLASIA, ATYPIA, HYPERPLASIA OR MALIGNANCY IDENTIFIED.  Patient is asymptomatic today her preoperative hemoglobin was 11.6 postoperative hemoglobin 10.9  Exam: Abdomen: Incision ports intact. Soft nontender no rebound or guarding Pelvic: Bartholin urethra Skene was within normal limits Vagina: No lesions or discharge Vaginal cuff intact Bimanual exam no palpable mass or tenderness Rectal exam not done  Assessment/plan: Patient 6 weeks status post laparoscopic-assisted vaginal hysterectomy with bilateral salpingectomy doing well. We will check her CBC today. I've given her a note to return to work. She may resume full normal activity without any restriction. Patient otherwise scheduled to return back in one year for annual exam or when necessary.

## 2016-05-27 ENCOUNTER — Telehealth: Payer: Self-pay | Admitting: *Deleted

## 2016-05-27 NOTE — Telephone Encounter (Signed)
Pt called and left message in triage voicemail requesting CBC result on 05/18/16. I called pt back and left on her voicemail a detailed message that hemoglobin normal range now.

## 2016-06-22 ENCOUNTER — Encounter: Payer: Self-pay | Admitting: Pediatrics

## 2016-07-08 ENCOUNTER — Ambulatory Visit (INDEPENDENT_AMBULATORY_CARE_PROVIDER_SITE_OTHER): Payer: BLUE CROSS/BLUE SHIELD | Admitting: Medical

## 2016-07-08 ENCOUNTER — Encounter: Payer: Self-pay | Admitting: Medical

## 2016-07-08 ENCOUNTER — Ambulatory Visit
Admission: RE | Admit: 2016-07-08 | Discharge: 2016-07-08 | Disposition: A | Discharge status: Home / Self Care | Payer: BLUE CROSS/BLUE SHIELD | Source: Ambulatory Visit | Attending: Medical | Admitting: Medical

## 2016-07-08 VITALS — BP 100/60 | HR 68 | Wt 171.2 lb

## 2016-07-08 DIAGNOSIS — M25562 Pain in left knee: Secondary | ICD-10-CM

## 2016-07-08 DIAGNOSIS — Z2821 Immunization not carried out because of patient refusal: Secondary | ICD-10-CM | POA: Diagnosis not present

## 2016-07-08 DIAGNOSIS — R5382 Chronic fatigue, unspecified: Secondary | ICD-10-CM | POA: Diagnosis not present

## 2016-07-08 DIAGNOSIS — E038 Other specified hypothyroidism: Secondary | ICD-10-CM | POA: Diagnosis not present

## 2016-07-08 DIAGNOSIS — G4710 Hypersomnia, unspecified: Secondary | ICD-10-CM | POA: Diagnosis not present

## 2016-07-08 DIAGNOSIS — D509 Iron deficiency anemia, unspecified: Secondary | ICD-10-CM

## 2016-07-08 DIAGNOSIS — R0683 Snoring: Secondary | ICD-10-CM

## 2016-07-08 DIAGNOSIS — M25561 Pain in right knee: Secondary | ICD-10-CM

## 2016-07-08 DIAGNOSIS — G8929 Other chronic pain: Secondary | ICD-10-CM | POA: Insufficient documentation

## 2016-07-08 DIAGNOSIS — R400 Somnolence: Secondary | ICD-10-CM

## 2016-07-08 LAB — CBC
HEMATOCRIT: 36 % (ref 35.0–45.0)
Hemoglobin: 11.9 g/dL (ref 11.7–15.5)
MCH: 32.9 pg (ref 27.0–33.0)
MCHC: 33.1 g/dL (ref 32.0–36.0)
MCV: 99.4 fL (ref 80.0–100.0)
MPV: 10.4 fL (ref 7.5–12.5)
PLATELETS: 260 10*3/uL (ref 140–400)
RBC: 3.62 MIL/uL — ABNORMAL LOW (ref 3.80–5.10)
RDW: 11.9 % (ref 11.0–15.0)
WBC: 3.4 10*3/uL — ABNORMAL LOW (ref 4.0–10.5)

## 2016-07-08 LAB — IRON AND TIBC
%SAT: 32 % (ref 11–50)
IRON: 64 ug/dL (ref 40–190)
TIBC: 199 ug/dL — ABNORMAL LOW (ref 250–450)
UIBC: 135 ug/dL (ref 125–400)

## 2016-07-08 LAB — TSH: TSH: 0.38 mIU/L — ABNORMAL LOW

## 2016-07-08 LAB — T4, FREE: Free T4: 2 ng/dL — ABNORMAL HIGH (ref 0.8–1.8)

## 2016-07-08 NOTE — Progress Notes (Signed)
Subjective:    Patient ID: Miranda Fowler, female    DOB: [birth year], 35 y.o.   MRN: [id number]  HPI  Chief Complaint  Patient presents with  . Thyroid Problem    refill thyroid medication.   . Knee Pain    ? arthritis, reports tired.    Here for a few concerns.  Feeling tired all the time for several weeks.  Has hx/o anemia, hypothyroidism.  Denies cravings, no bleeding or bruising.   Is compliant with her thyroid medicaitons. Not taking any iron.  Not depressed.  Exercising with walking fairly regularly.  Eating healthy.   She does endorse snoring, daytime somnolence, non rested upon awakening.  No witnessed apnea.   Works for day care, but also drives day care Sharen Heck has hx/o sleep apnea  She notes chronic knee pains for over a year.  No knee swelling, no falls, no injury or trauma.  They pop and crack.  No prior xray.  Uses NSAID occasionally.  No other aggravating or relieving factors. No other complaint.  Past Medical History:  Diagnosis Date  . Anemia    Chronic problem for patient.  Followed by Heme, receives IV iron transfusions secondary to inability to tolerate PO supplements.    . Arthritis    bilateral knees  . Headache    Migraines  . Hypothyroidism    post-radiation at age 59  . Shortness of breath dyspnea    Current Outpatient Prescriptions on File Prior to Visit  Medication Sig Dispense Refill  . SYNTHROID 137 MCG tablet Take 1 tablet (137 mcg total) by mouth daily before breakfast. 30 tablet 2  . docusate sodium (COLACE) 100 MG capsule Take 1 capsule (100 mg total) by mouth 2 (two) times daily. (Patient not taking: Reported on 07/08/2016) 30 capsule 2  . ibuprofen (ADVIL,MOTRIN) 800 MG tablet Take 1 tablet (800 mg total) by mouth every 8 (eight) hours as needed. (Patient not taking: Reported on 07/08/2016) 30 tablet 0  . ibuprofen (ADVIL,MOTRIN) 800 MG tablet Take 1 tablet by mouth 3 times a day when necessary (Patient not taking: Reported on 07/08/2016) 30  tablet 1  . meloxicam (MOBIC) 15 MG tablet Take 1 tablet (15 mg total) by mouth daily as needed for pain. (Patient not taking: Reported on 07/08/2016) 30 tablet 3  . metoCLOPramide (REGLAN) 10 MG tablet Take 1 tablet (10 mg total) by mouth 3 (three) times daily with meals. (Patient not taking: Reported on 07/08/2016) 30 tablet 1  . Multiple Vitamin (MULTIVITAMIN WITH MINERALS) TABS tablet Take 1 tablet by mouth daily. Reported on 04/19/2016    . Omega-3 Fatty Acids (FISH OIL PO) Take 1 capsule by mouth daily. Reported on 04/19/2016    . oxyCODONE-acetaminophen (PERCOCET) 5-325 MG tablet Take 1 tablet by mouth every 4 (four) hours as needed for severe pain. (Patient not taking: Reported on 07/08/2016) 30 tablet 0   No current facility-administered medications on file prior to visit.    Past Surgical History:  Procedure Laterality Date  . BILATERAL SALPINGECTOMY Bilateral 2008   pt denies  . CESAREAN SECTION    . CYSTOSCOPY N/A 04/06/2016   Procedure: CYSTOSCOPY;  Surgeon: Terrance Mass, MD;  Location: [facility];  Service: Gynecology;  Laterality: N/A;  . LAPAROSCOPIC ASSISTED VAGINAL HYSTERECTOMY N/A 04/06/2016   Procedure: LAPAROSCOPIC ASSISTED VAGINAL HYSTERECTOMY,  Bilateral Salpingectomy;  Surgeon: Terrance Mass, MD;  Location: [facility];  Service: Gynecology;  Laterality: N/A;  . TUBAL LIGATION  Review of Systems As in subjective     Objective:   Physical Exam BP 100/60   Pulse 68   Wt 171 lb 3.2 oz (77.7 kg)   LMP 11/07/2015   BMI 31.31 kg/m   General appearance: alert, no distress, WD/WN, AA female HEENT: normocephalic, sclerae anicteric, TMs pearly, nares patent, no discharge or erythema, pharynx normal Oral cavity: MMM, no lesions Neck: supple, no lymphadenopathy, no thyromegaly, no masses Heart: RRR, normal S1, S2, no murmurs Lungs: CTA bilaterally, no wheezes, rhonchi, or rales Abdomen: +bs, soft, non tender, non distended, no masses, no hepatomegaly, no  splenomegaly Pulses: 2+ symmetric, upper and lower extremities, normal cap refill ExT: no edema Knees nontender, no deformity, no laxity, normal ROM, rest of LE unremarkable, unremarkable MSK exam       Assessment & Plan:   Encounter Diagnoses  Name Primary?  . Other specified hypothyroidism Yes  . Iron deficiency anemia   . Knee pain, bilateral   . Chronic fatigue   . Snoring   . Daytime somnolence   . Influenza vaccination declined     hypothyroidism - labs today, c/t Synthroid.  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

## 2016-07-11 ENCOUNTER — Other Ambulatory Visit: Payer: Self-pay | Admitting: Medical

## 2016-07-11 MED ORDER — SYNTHROID 125 MCG PO TABS: 125.0000 ug | ORAL_TABLET | Freq: Every day | ORAL | 1 refills | Status: DC

## 2016-07-11 MED ORDER — MELOXICAM 15 MG PO TABS: 15.0000 mg | ORAL_TABLET | Freq: Every day | ORAL | 1 refills | Status: DC | PRN

## 2016-07-15 ENCOUNTER — Other Ambulatory Visit: Payer: BLUE CROSS/BLUE SHIELD

## 2016-07-15 ENCOUNTER — Ambulatory Visit: Payer: BLUE CROSS/BLUE SHIELD | Admitting: Oncology

## 2016-08-10 ENCOUNTER — Encounter: Payer: Self-pay | Admitting: Medical

## 2016-08-10 ENCOUNTER — Ambulatory Visit (INDEPENDENT_AMBULATORY_CARE_PROVIDER_SITE_OTHER): Payer: BLUE CROSS/BLUE SHIELD | Admitting: Medical

## 2016-08-10 VITALS — BP 120/70 | HR 76 | Temp 98.2°F | Resp 18 | Wt 171.0 lb

## 2016-08-10 DIAGNOSIS — J320 Chronic maxillary sinusitis: Secondary | ICD-10-CM

## 2016-08-10 DIAGNOSIS — R05 Cough: Secondary | ICD-10-CM

## 2016-08-10 DIAGNOSIS — R059 Cough, unspecified: Secondary | ICD-10-CM

## 2016-08-10 MED ORDER — AMOXICILLIN 500 MG PO TABS: ORAL_TABLET | ORAL | 0 refills | Status: DC

## 2016-08-10 NOTE — Progress Notes (Signed)
Subjective:  Miranda Fowler is a 35 y.o. female who presents for possible sinus infection, congestion.   She notes 5-6 day hx/o sinus pressure, head congestion, some sore throat. No fever, but does have some ear pain.   No body aches, no sweats, no chills.   She notes some cough, worse at night, productive cough.  No wheezing, no SOB.   Using alka seltzer cold and sinus.    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  Nonsmoker.   No other aggravating or relieving factors.  No other c/o.  Past Medical History:  Diagnosis Date  . Anemia    Chronic problem for patient.  Followed by Heme, receives IV iron transfusions secondary to inability to tolerate PO supplements.    . Arthritis    bilateral knees  . Headache    Migraines  . Hypothyroidism    post-radiation at age 47  . Shortness of breath dyspnea    ROS as in subjective   Objective: BP 120/70   Pulse 76   Temp 98.2 F (36.8 C)   Resp 18   Wt 171 lb (77.6 kg)   LMP 11/07/2015   BMI 31.28 kg/m   General appearance: Alert, WD/WN, no distress                             Skin: warm, no rash                           Head: + maxillary sinus tenderness,                            Eyes: conjunctiva normal, corneas clear, PERRLA                            Ears: pearly TMs, external ear canals normal                          Nose: septum midline, turbinates swollen, with erythema and clear discharge             Mouth/throat: MMM, tongue normal, mild pharyngeal erythema                           Neck: supple, no adenopathy, no thyromegaly, non tender                          Heart: RRR, normal S1, S2, no murmurs                         Lungs: CTA bilaterally, no wheezes, rales, or rhonchi      Assessment  Encounter Diagnoses  Name Primary?  . Maxillary sinusitis, unspecified chronicity Yes  . Cough       Plan: Discussed symptoms, exam findings.  Begin amoxicillin  Specific home care recommendations  today include:  Only take over-the-counter (OTC) or prescription medicines for pain, discomfort, or fever as directed by your caregiver.    Decongestant: You may use OTC Guaifenesin (Mucinex plain) for congestion.  You may use Pseudoephedrine (Sudafed) only if you don't have blood pressure problems or a diagnosis of hypertension.  Cough suppression: If you have cough  from drainage, you may use over-the-counter Dextromethorphan (Delsym) as directed on the label  Pain/fever relief: You may use over-the-counter Tylenol for pain or fever  Drink extra fluids. Fluids help thin the mucus so your sinuses can drain more easily.   Applying either moist heat or ice packs to the sinus areas may help relieve discomfort.  Use saline nasal sprays to help moisten your sinuses. The sprays can be found at your local drugstore.   Prescription (s) given: Miranda Fowler was seen today for sinusitis.  Diagnoses and all orders for this visit:  Maxillary sinusitis, unspecified chronicity  Cough  Other orders -     amoxicillin (AMOXIL) 500 MG tablet; 2 tablets po BID x 10 days  Patient was advised to call or return if worse or not improving in the next few days.    Patient voiced understanding of diagnosis, recommendations, and treatment plan.

## 2016-08-26 ENCOUNTER — Encounter: Payer: Self-pay | Admitting: Family Medicine

## 2016-08-26 ENCOUNTER — Ambulatory Visit (INDEPENDENT_AMBULATORY_CARE_PROVIDER_SITE_OTHER): Payer: BLUE CROSS/BLUE SHIELD | Admitting: Family Medicine

## 2016-08-26 VITALS — BP 122/70 | HR 61 | Wt 175.4 lb

## 2016-08-26 DIAGNOSIS — T148XXA Other injury of unspecified body region, initial encounter: Secondary | ICD-10-CM | POA: Diagnosis not present

## 2016-08-26 DIAGNOSIS — S8011XA Contusion of right lower leg, initial encounter: Secondary | ICD-10-CM | POA: Diagnosis not present

## 2016-08-26 DIAGNOSIS — Z862 Personal history of diseases of the blood and blood-forming organs and certain disorders involving the immune mechanism: Secondary | ICD-10-CM

## 2016-08-26 LAB — COMPREHENSIVE METABOLIC PANEL
ALT: 24 U/L (ref 6–29)
AST: 24 U/L (ref 10–30)
Albumin: 3.7 g/dL (ref 3.6–5.1)
Alkaline Phosphatase: 55 U/L (ref 33–115)
BUN: 11 mg/dL (ref 7–25)
CHLORIDE: 107 mmol/L (ref 98–110)
CO2: 24 mmol/L (ref 20–31)
CREATININE: 0.89 mg/dL (ref 0.50–1.10)
Calcium: 9.1 mg/dL (ref 8.6–10.2)
Glucose, Bld: 142 mg/dL — ABNORMAL HIGH (ref 65–99)
Potassium: 3.8 mmol/L (ref 3.5–5.3)
SODIUM: 138 mmol/L (ref 135–146)
TOTAL PROTEIN: 6.9 g/dL (ref 6.1–8.1)
Total Bilirubin: 0.3 mg/dL (ref 0.2–1.2)

## 2016-08-26 LAB — CBC WITH DIFFERENTIAL/PLATELET
BASOS ABS: 0 {cells}/uL (ref 0–200)
BASOS PCT: 0 %
EOS PCT: 0 %
Eosinophils Absolute: 0 cells/uL — ABNORMAL LOW (ref 15–500)
HCT: 33.5 % — ABNORMAL LOW (ref 35.0–45.0)
Hemoglobin: 10.9 g/dL — ABNORMAL LOW (ref 11.7–15.5)
Lymphocytes Relative: 42 %
Lymphs Abs: 1764 cells/uL (ref 850–3900)
MCH: 32.7 pg (ref 27.0–33.0)
MCHC: 32.5 g/dL (ref 32.0–36.0)
MCV: 100.6 fL — ABNORMAL HIGH (ref 80.0–100.0)
MONOS PCT: 13 %
MPV: 9.6 fL (ref 7.5–12.5)
Monocytes Absolute: 546 cells/uL (ref 200–950)
NEUTROS ABS: 1890 {cells}/uL (ref 1500–7800)
Neutrophils Relative %: 45 %
PLATELETS: 287 10*3/uL (ref 140–400)
RBC: 3.33 MIL/uL — ABNORMAL LOW (ref 3.80–5.10)
RDW: 12.4 % (ref 11.0–15.0)
WBC: 4.2 10*3/uL (ref 4.0–10.5)

## 2016-08-26 NOTE — Patient Instructions (Signed)
Hematoma  A hematoma is a collection of blood under the skin, in an organ, in a body space, in a joint space, or in other tissue. The blood can clot to form a lump that you can see and feel. The lump is often firm and may sometimes become sore and tender. Most hematomas get better in a few days to weeks. However, some hematomas may be serious and require medical care. Hematomas can range in size from very small to very large.  CAUSES   A hematoma can be caused by a blunt or penetrating injury. It can also be caused by spontaneous leakage from a blood vessel under the skin. Spontaneous leakage from a blood vessel is more likely to occur in older people, especially those taking blood thinners. Sometimes, a hematoma can develop after certain medical procedures.  SIGNS AND SYMPTOMS   · A firm lump on the body.  · Possible pain and tenderness in the area.  · Bruising. Blue, dark blue, purple-red, or yellowish skin may appear at the site of the hematoma if the hematoma is close to the surface of the skin.  For hematomas in deeper tissues or body spaces, the signs and symptoms may be subtle. For example, an intra-abdominal hematoma may cause abdominal pain, weakness, fainting, and shortness of breath. An intracranial hematoma may cause a headache or symptoms such as weakness, trouble speaking, or a change in consciousness.  DIAGNOSIS   A hematoma can usually be diagnosed based on your medical history and a physical exam. Imaging tests may be needed if your health care provider suspects a hematoma in deeper tissues or body spaces, such as the abdomen, head, or chest. These tests may include ultrasonography or a CT scan.   TREATMENT   Hematomas usually go away on their own over time. Rarely does the blood need to be drained out of the body. Large hematomas or those that may affect vital organs will sometimes need surgical drainage or monitoring.  HOME CARE INSTRUCTIONS   · Apply ice to the injured area:      Put ice in a  plastic bag.      Place a towel between your skin and the bag.      Leave the ice on for 20 minutes, 2-3 times a day for the first 1 to 2 days.    · After the first 2 days, switch to using warm compresses on the hematoma.    · Elevate the injured area to help decrease pain and swelling. Wrapping the area with an elastic bandage may also be helpful. Compression helps to reduce swelling and promotes shrinking of the hematoma. Make sure the bandage is not wrapped too tight.    · If your hematoma is on a lower extremity and is painful, crutches may be helpful for a couple days.    · Only take over-the-counter or prescription medicines as directed by your health care provider.  SEEK IMMEDIATE MEDICAL CARE IF:   · You have increasing pain, or your pain is not controlled with medicine.    · You have a fever.    · You have worsening swelling or discoloration.    · Your skin over the hematoma breaks or starts bleeding.    · Your hematoma is in your chest or abdomen and you have weakness, shortness of breath, or a change in consciousness.  · Your hematoma is on your scalp (caused by a fall or injury) and you have a worsening headache or a change in alertness or consciousness.  MAKE SURE YOU:   ·   Understand these instructions.  · Will watch your condition.  · Will get help right away if you are not doing well or get worse.     This information is not intended to replace advice given to you by your health care provider. Make sure you discuss any questions you have with your health care provider.     Document Released: 05/18/2004 Document Revised: 06/06/2013 Document Reviewed: 03/14/2013  Elsevier Interactive Patient Education ©2016 [employer]

## 2016-08-26 NOTE — Progress Notes (Signed)
   Subjective:    Patient ID: Miranda Fowler, female    DOB: 13-Jun-1981, 35 y.o.   MRN: [id number]  HPI Chief Complaint  Patient presents with  . bruise on leg    bruise on right inner thigh. noticied it today and pant leg is making it burn   She is here with complaints of a bruise to her right medial upper leg with no known injury. States she noticed this earlier today. She does report a history of anemia but no clotting disorder. She states the area of the bruise is tender. No pain otherwise to her RLE. No other bruises. Denies numbness, tingling, weakness. Denies fever, chills, fatigue, unexplained weight loss, abdominal pain.   Reviewed allergies, medications, past medical, surgical, and social history.   Review of Systems Pertinent positives and negatives in the history of present illness.     Objective:   Physical Exam  Constitutional: She appears well-developed and well-nourished. No distress.  Musculoskeletal:       Right upper leg: She exhibits tenderness. She exhibits no swelling and no edema.       Legs: 5 cm x 2.5 cm purple- elongated discreet bruise without edema or firmness. No other hematomas.  Right lower extremity is neurovascularly intact.   BP 122/70   Pulse 61   Wt 175 lb 6.4 oz (79.6 kg)   LMP 11/07/2015   BMI 32.08 kg/m       Assessment & Plan:  Contusion of right lower extremity, initial encounter - Plan: CBC with Differential/Platelet, Comprehensive metabolic panel  History of anemia - Plan: CBC with Differential/Platelet  Discussed that she may have had an injury to the area without knowing it. She does not have multiple hematomas. No obvious explanation for the contusion. Plan to order labs to rule out any underlying etiology. Discussed using ice or heat to the area and Tylenol for pain. Discussed stages of healing for the contusion. She will call or return if she notices more bruising or if this particular hematoma worsens. Follow up pending  labs.

## 2016-08-27 ENCOUNTER — Telehealth: Payer: Self-pay

## 2016-08-27 NOTE — Telephone Encounter (Signed)
I recommend if she cannot take oral iron that she should make sure she is eating foods high in iron and then follow-up with Audelia Acton her PCP or her gynecologist, I'm not sure who has ordered iron transfusions in the past for her. Her hemoglobin is not that low and is not worrisome right now.  Red meat, eggs, [place], red kidney beans and white beans, and whole gr breakfast cereals such as raisin bran, Cheerios, special K, baked potato with the skin on it, dried fruits, nuts are all high in iron. Also, eating diet well balanced with fruits and vegetables is recommended with anemia. This should not contribute to the bruise she has on her thigh. Her platelet count was normal.

## 2016-08-27 NOTE — Telephone Encounter (Signed)
LM for pt to CB

## 2016-08-27 NOTE — Telephone Encounter (Signed)
Pt made aware. /RLB  

## 2016-08-27 NOTE — Telephone Encounter (Signed)
Pt called the office to review labs- reviewed this with her, but she states she is unable to keep iron down. She said she usually goes to [facility] for iron infusions. What are her other options?   Thank you, Wells Guiles

## 2016-10-19 ENCOUNTER — Ambulatory Visit (INDEPENDENT_AMBULATORY_CARE_PROVIDER_SITE_OTHER): Payer: BLUE CROSS/BLUE SHIELD | Admitting: Family Medicine

## 2016-10-19 ENCOUNTER — Encounter: Payer: Self-pay | Admitting: Family Medicine

## 2016-10-19 VITALS — BP 108/68 | HR 68 | Temp 98.4°F | Resp 16 | Wt 181.6 lb

## 2016-10-19 DIAGNOSIS — J019 Acute sinusitis, unspecified: Secondary | ICD-10-CM

## 2016-10-19 MED ORDER — AMOXICILLIN 875 MG PO TABS: 875.0000 mg | ORAL_TABLET | Freq: Two times a day (BID) | ORAL | 0 refills | Status: DC

## 2016-10-19 NOTE — Patient Instructions (Signed)

## 2016-10-19 NOTE — Progress Notes (Signed)
Subjective: Chief Complaint  Patient presents with  . sinus infection    sinus. been going on for 2 weeks.- sinus pressure, headache, running nose     Miranda Fowler is a 36 y.o. female who presents for possible sinus infection.  Symptoms include a 2 week history of rhinorrhea, worsening nasal congestion, sinus pain worse on right maxillary region, post nasal drainage, cough at night..  Denies fever, chills, ear pain, sore throat, chest pain, palpitations, abdominal pain, N/V/D.   Past history is significant for no history of pneumonia or bronchitis. Patient is a non-smoker.  Using sudafed and alka seltzer plus for symptoms.  Denies sick contacts.  No other aggravating or relieving factors.  No other c/o. Denies recent antibiotic use.   ROS as in subjective   Objective: Vitals:   10/19/16 1557  BP: 108/68  Pulse: 68  Resp: 16  Temp: 98.4 F (36.9 C)    General appearance: Alert, WD/WN, no distress                             Skin: warm, no rash                           Head: + maxillary sinus tenderness R>L                            Eyes: conjunctiva normal, corneas clear, PERRLA                            Ears: pearly TMs, external ear canals normal                          Nose: septum midline, turbinates swollen, with erythema and thick discharge             Mouth/throat: MMM, tongue normal, moderate pharyngeal erythema                           Neck: supple, no adenopathy, no thyromegaly, nontender                          Heart: RRR, normal S1, S2, no murmurs                         Lungs: CTA bilaterally, no wheezes, rales, or rhonchi       Assessment and Plan: Acute sinusitis with symptoms > 10 days - Plan: amoxicillin (AMOXIL) 875 MG tablet  Prescription given for Amoxil.  Can use OTC Mucinex for congestion.  Tylenol or Ibuprofen OTC for fever and malaise.  Discussed symptomatic relief, nasal saline flush, and call or return if worse or not back to baseline after  completing the antibiotic.

## 2016-10-28 ENCOUNTER — Encounter: Payer: Self-pay | Admitting: Medical

## 2016-10-28 ENCOUNTER — Ambulatory Visit (INDEPENDENT_AMBULATORY_CARE_PROVIDER_SITE_OTHER): Payer: BLUE CROSS/BLUE SHIELD | Admitting: Medical

## 2016-10-28 VITALS — BP 124/70 | HR 82 | Ht 64.0 in | Wt 179.8 lb

## 2016-10-28 DIAGNOSIS — R400 Somnolence: Secondary | ICD-10-CM

## 2016-10-28 DIAGNOSIS — R0683 Snoring: Secondary | ICD-10-CM

## 2016-10-28 DIAGNOSIS — D649 Anemia, unspecified: Secondary | ICD-10-CM

## 2016-10-28 DIAGNOSIS — Z2821 Immunization not carried out because of patient refusal: Secondary | ICD-10-CM

## 2016-10-28 DIAGNOSIS — M25561 Pain in right knee: Secondary | ICD-10-CM

## 2016-10-28 DIAGNOSIS — R739 Hyperglycemia, unspecified: Secondary | ICD-10-CM | POA: Insufficient documentation

## 2016-10-28 DIAGNOSIS — Z9289 Personal history of other medical treatment: Secondary | ICD-10-CM

## 2016-10-28 DIAGNOSIS — M25562 Pain in left knee: Secondary | ICD-10-CM

## 2016-10-28 DIAGNOSIS — Z113 Encounter for screening for infections with a predominantly sexual mode of transmission: Secondary | ICD-10-CM

## 2016-10-28 DIAGNOSIS — R5382 Chronic fatigue, unspecified: Secondary | ICD-10-CM | POA: Diagnosis not present

## 2016-10-28 DIAGNOSIS — E038 Other specified hypothyroidism: Secondary | ICD-10-CM | POA: Diagnosis not present

## 2016-10-28 DIAGNOSIS — Z0000 Encounter for general adult medical examination without abnormal findings: Secondary | ICD-10-CM | POA: Insufficient documentation

## 2016-10-28 LAB — CBC WITH DIFFERENTIAL/PLATELET
BASOS PCT: 1 %
Basophils Absolute: 43 cells/uL (ref 0–200)
Eosinophils Absolute: 0 cells/uL — ABNORMAL LOW (ref 15–500)
Eosinophils Relative: 0 %
HEMATOCRIT: 36.6 % (ref 35.0–45.0)
Hemoglobin: 12 g/dL (ref 11.7–15.5)
LYMPHS PCT: 47 %
Lymphs Abs: 2021 cells/uL (ref 850–3900)
MCH: 32.9 pg (ref 27.0–33.0)
MCHC: 32.8 g/dL (ref 32.0–36.0)
MCV: 100.3 fL — ABNORMAL HIGH (ref 80.0–100.0)
MONO ABS: 817 {cells}/uL (ref 200–950)
MPV: 10.4 fL (ref 7.5–12.5)
Monocytes Relative: 19 %
NEUTROS PCT: 33 %
Neutro Abs: 1419 cells/uL — ABNORMAL LOW (ref 1500–7800)
PLATELETS: 286 10*3/uL (ref 140–400)
RBC: 3.65 MIL/uL — AB (ref 3.80–5.10)
RDW: 12.5 % (ref 11.0–15.0)
WBC: 4.3 10*3/uL (ref 4.0–10.5)

## 2016-10-28 LAB — POCT URINALYSIS DIPSTICK
BILIRUBIN UA: NEGATIVE
GLUCOSE UA: NEGATIVE
Ketones, UA: NEGATIVE
Leukocytes, UA: NEGATIVE
NITRITE UA: NEGATIVE
Protein, UA: NEGATIVE
RBC UA: NEGATIVE
SPEC GRAV UA: 1.01
Urobilinogen, UA: NEGATIVE
pH, UA: 6

## 2016-10-28 LAB — TSH: TSH: 4.19 m[IU]/L

## 2016-10-28 LAB — T4, FREE: FREE T4: 1.3 ng/dL (ref 0.8–1.8)

## 2016-10-28 NOTE — Progress Notes (Signed)
Subjective:   HPI  Miranda Fowler is a 36 y.o. female who presents for a complete physical.  Medical care team includes:  Sees eye doctor, dentist  Sees Dr. Toney Rakes for gyn   Concerns:  None  Going sky diving in a week  Reviewed their medical, surgical, family, social, medication, and allergy history and updated chart as appropriate.  Past Medical History:  Diagnosis Date  . Anemia    Chronic problem for patient.  Followed by Heme, receives IV iron transfusions secondary to inability to tolerate PO supplements.    . Arthritis    bilateral knees  . Headache    Migraines  . Hypothyroidism    post-radiation at age 39  . Shortness of breath dyspnea     Past Surgical History:  Procedure Laterality Date  . BILATERAL SALPINGECTOMY Bilateral 2008   pt denies  . CESAREAN SECTION    . CYSTOSCOPY N/A 04/06/2016   Procedure: CYSTOSCOPY;  Surgeon: Terrance Mass, MD;  Location: [facility];  Service: Gynecology;  Laterality: N/A;  . LAPAROSCOPIC ASSISTED VAGINAL HYSTERECTOMY N/A 04/06/2016   Procedure: LAPAROSCOPIC ASSISTED VAGINAL HYSTERECTOMY,  Bilateral Salpingectomy;  Surgeon: Terrance Mass, MD;  Location: [facility];  Service: Gynecology;  Laterality: N/A;  . TUBAL LIGATION      Social History   Social History  . Marital status: Single    Spouse name: N/A  . Number of children: N/A  . Years of education: N/A   Occupational History  . Not on file.   Social History Main Topics  . Smoking status: Never Smoker  . Smokeless tobacco: Never Used  . Alcohol use No     Comment: wine  . Drug use: No  . Sexual activity: Yes    Birth control/ protection: Injection, Surgical     Comment: TUBAL-LIAGTION   Other Topics Concern  . Not on file   Social History Narrative   Married, 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  10/2016    Family History  Problem Relation Age of Onset  . Diabetes Maternal Grandmother   . Cancer Paternal Aunt     breast  . Heart  disease Paternal Grandmother   . Anemia Mother   . Lupus Mother   . Sickle cell trait Mother   . Hypertension Father   . Sickle cell trait Sister   . Diabetes Maternal Grandfather   . Cancer Paternal Aunt     cervical     Current Outpatient Prescriptions:  .  SYNTHROID 125 MCG tablet, Take 1 tablet (125 mcg total) by mouth daily before breakfast., Disp: 90 tablet, Rfl: 1  No Known Allergies    Review of Systems Constitutional: -fever, -chills, -sweats, -unexpected weight change, -decreased appetite, -fatigue Allergy: -sneezing, -itching, -congestion Dermatology: -changing moles, --rash, -lumps ENT: -runny nose, -ear pain, -sore throat, -hoarseness, -sinus pain, -teeth pain, - ringing in ears, -hearing loss, -nosebleeds Cardiology: -chest pain, -palpitations, -swelling, -difficulty breathing when lying flat, -waking up short of breath Respiratory: -cough, -shortness of breath, -difficulty breathing with exercise or exertion, -wheezing, -coughing up blood Gastroenterology: -abdominal pain, -nausea, -vomiting, -diarrhea, -constipation, -blood in stool, -changes in bowel movement, -difficulty swallowing or eating Hematology: -bleeding, -bruising  Musculoskeletal: -joint aches, -muscle aches, -joint swelling, -back pain, -neck pain, -cramping, -changes in gait Ophthalmology: denies vision changes, eye redness, itching, discharge Urology: -burning with urination, -difficulty urinating, -blood in urine, -urinary frequency, -urgency, -incontinence Neurology: -headache, -weakness, -tingling, -numbness, -memory loss, -falls, -dizziness Psychology: -  depressed mood, -agitation, -sleep problems     Objective:   Physical Exam  BP 124/70   Pulse 82   Ht 5\' 4"  (1.626 m)   Wt 179 lb 12.8 oz (81.6 kg)   LMP 11/07/2015   SpO2 99%   BMI 30.86 kg/m   General appearance: alert, no distress, WD/WN, AA female Skin: scattered macules, no worrisome findings HEENT: normocephalic,  conjunctiva/corneas normal, sclerae anicteric, PERRLA, EOMi, nares patent, no discharge or erythema, pharynx normal Oral cavity: MMM, tongue normal, teeth normal Neck: supple, no lymphadenopathy, no thyromegaly, no masses, normal ROM, no bruits Chest: non tender, normal shape and expansion Heart: RRR, normal S1, S2, no murmurs Lungs: CTA bilaterally, no wheezes, rhonchi, or rales Abdomen: +bs, soft, non tender, non distended, no masses, no hepatomegaly, no splenomegaly, no bruits Back: non tender, normal ROM, no scoliosis Musculoskeletal: upper extremities non tender, no obvious deformity, normal ROM throughout, lower extremities non tender, no obvious deformity, normal ROM throughout Extremities: no edema, no cyanosis, no clubbing Pulses: 2+ symmetric, upper and lower extremities, normal cap refill Neurological: alert, oriented x 3, CN2-12 intact, strength normal upper extremities and lower extremities, sensation normal throughout, DTRs 2+ throughout, no cerebellar signs, gait normal Psychiatric: normal affect, behavior normal, pleasant  Breast/gyn/rectal - deferred to gyn   Adult ECG Report  Indication: physical  Rate: 65 bpm  Rhythm: normal sinus rhythm and sinus arrhythmia  QRS Axis: 31 degrees  PR Interval: 172ms  QRS Duration: 62ms  QTc: 422ms  Conduction Disturbances: none  Other Abnormalities: none  Patient's cardiac risk factors are: obesity (BMI >= 30 kg/m2).  EKG comparison: none  Narrative Interpretation: sinus arrhythmia, otherwise normal     Assessment and Plan :   Encounter Diagnoses  Name Primary?  . Routine general medical examination at a health care facility Yes  . Anemia, unspecified type   . Other specified hypothyroidism   . Chronic fatigue   . Pain in both knees, unspecified chronicity   . Hyperglycemia   . Screen for STD (sexually transmitted disease)   . Snoring   . Daytime somnolence   . History of transfusion   . Influenza vaccination declined      Physical exam - discussed healthy lifestyle, diet, exercise, preventative care, vaccinations, and addressed their concerns.  Handout given. See your eye doctor yearly for routine vision care. See your dentist yearly for routine dental care including hygiene visits twice yearly. Anemia - prior blood loss related.  Lab today Consider sleep study given symptoms and risks for OSA Screen for STD at her request She declines flu shot despite my strong recommendation particularly given she works in day care Hypothyroidism - labs today Follow-up pending labs  Aliza was seen today for physical.  Diagnoses and all orders for this visit:  Routine general medical examination at a health care facility -     Urinalysis Dipstick -     TSH -     T4, free -     Hemoglobin A1c -     CBC with Differential/Platelet -     HIV antibody -     RPR -     GC/Chlamydia Probe Amp -     EKG 12-Lead  Anemia, unspecified type -     CBC with Differential/Platelet  Other specified hypothyroidism -     TSH -     T4, free  Chronic fatigue  Pain in both knees, unspecified chronicity  Hyperglycemia -     Hemoglobin A1c  Screen for STD (sexually transmitted disease) -     HIV antibody -     RPR -     GC/Chlamydia Probe Amp  Snoring  Daytime somnolence  History of transfusion  Influenza vaccination declined

## 2016-10-29 ENCOUNTER — Other Ambulatory Visit: Payer: Self-pay | Admitting: Medical

## 2016-10-29 LAB — GC/CHLAMYDIA PROBE AMP
CT PROBE, AMP APTIMA: NOT DETECTED
GC Probe RNA: NOT DETECTED

## 2016-10-29 LAB — HEMOGLOBIN A1C
Hgb A1c MFr Bld: 4.9 % (ref <5.7)
MEAN PLASMA GLUCOSE: 94 mg/dL

## 2016-10-29 LAB — RPR

## 2016-10-29 LAB — HIV ANTIBODY (ROUTINE TESTING W REFLEX): HIV 1&2 Ab, 4th Generation: NONREACTIVE

## 2016-10-29 MED ORDER — SYNTHROID 125 MCG PO TABS: 125.0000 ug | ORAL_TABLET | Freq: Every day | ORAL | 1 refills | Status: DC

## 2016-11-02 ENCOUNTER — Encounter: Payer: BLUE CROSS/BLUE SHIELD | Admitting: Medical

## 2016-11-17 ENCOUNTER — Encounter: Payer: BLUE CROSS/BLUE SHIELD | Admitting: Medical

## 2016-11-19 ENCOUNTER — Ambulatory Visit (INDEPENDENT_AMBULATORY_CARE_PROVIDER_SITE_OTHER): Payer: BLUE CROSS/BLUE SHIELD | Admitting: Family Medicine

## 2016-11-19 ENCOUNTER — Encounter: Payer: Self-pay | Admitting: Family Medicine

## 2016-11-19 VITALS — BP 120/78 | HR 71 | Temp 98.5°F | Resp 16 | Wt 181.4 lb

## 2016-11-19 DIAGNOSIS — R05 Cough: Secondary | ICD-10-CM

## 2016-11-19 DIAGNOSIS — J069 Acute upper respiratory infection, unspecified: Secondary | ICD-10-CM | POA: Diagnosis not present

## 2016-11-19 DIAGNOSIS — R059 Cough, unspecified: Secondary | ICD-10-CM

## 2016-11-19 MED ORDER — AZITHROMYCIN 250 MG PO TABS: ORAL_TABLET | ORAL | 0 refills | Status: DC

## 2016-11-19 NOTE — Progress Notes (Signed)
Subjective: Chief Complaint  Patient presents with  . sick    coughing, sore throat, and barely talking, no chills or fever or sinus pressure or HA- 3 days     Miranda Fowler is a 36 y.o. female who presents for a 2 week history of sinus pressure, rhinorrhea, states 2 days ago her symptoms got worse with sore throat, hoarseness and productive cough. Has some nausea.   Denies fever, chills, body aches, head ache, ear pain, chest pain, shortness of breath, abdominal pain, vomiting, diarrhea.   Recent antibiotic in early January for sinus infection.   Treatment to date: cough suppressants and decongestants.  positive sick contacts.  No other aggravating or relieving factors.  No other c/o.  ROS as in subjective.   Objective: Vitals:   11/19/16 1612  BP: 120/78  Pulse: 71  Resp: 16  Temp: 98.5 F (36.9 C)    General appearance: Alert, WD/WN, no distress, mildly ill appearing                             Skin: warm, no rash                           Head: no sinus tenderness                            Eyes: conjunctiva normal, corneas clear, PERRLA                            Ears: pearly TMs, external ear canals normal                          Nose: septum midline, turbinates swollen, with erythema and clear discharge             Mouth/throat: MMM, tongue normal, mild pharyngeal erythema                           Neck: supple, no adenopathy, no thyromegaly, nontender                          Heart: RRR, normal S1, S2, no murmurs                         Lungs: CTA bilaterally, no wheezes, rales, or rhonchi      Assessment: Cough  Acute URI   Plan: Discussed diagnosis and treatment of URI and cough. Z-pack sent to pharmacy.   Suggested symptomatic OTC remedies. Nasal saline spray for congestion.  Tylenol or Ibuprofen OTC for fever and malaise.  Call/return if not back to baseline on day 10 of starting antibiotic. Discussed that if she continues to have issues with sinuses  that we may need to do further evaluation.

## 2016-11-19 NOTE — Patient Instructions (Signed)
Chloraseptic lozenges, fluids, salt water gargles for throat.

## 2017-02-18 ENCOUNTER — Ambulatory Visit (INDEPENDENT_AMBULATORY_CARE_PROVIDER_SITE_OTHER): Payer: BLUE CROSS/BLUE SHIELD | Admitting: Medical

## 2017-02-18 ENCOUNTER — Encounter: Payer: Self-pay | Admitting: Medical

## 2017-02-18 ENCOUNTER — Telehealth: Payer: Self-pay | Admitting: Medical

## 2017-02-18 VITALS — BP 122/80 | HR 68 | Wt 183.0 lb

## 2017-02-18 DIAGNOSIS — J019 Acute sinusitis, unspecified: Secondary | ICD-10-CM | POA: Diagnosis not present

## 2017-02-18 DIAGNOSIS — R635 Abnormal weight gain: Secondary | ICD-10-CM | POA: Diagnosis not present

## 2017-02-18 DIAGNOSIS — Z113 Encounter for screening for infections with a predominantly sexual mode of transmission: Secondary | ICD-10-CM | POA: Diagnosis not present

## 2017-02-18 DIAGNOSIS — N898 Other specified noninflammatory disorders of vagina: Secondary | ICD-10-CM

## 2017-02-18 DIAGNOSIS — E039 Hypothyroidism, unspecified: Secondary | ICD-10-CM

## 2017-02-18 LAB — POCT WET PREP (WET MOUNT)

## 2017-02-18 MED ORDER — FLUCONAZOLE 150 MG PO TABS: ORAL_TABLET | ORAL | 0 refills | Status: DC

## 2017-02-18 MED ORDER — CLARITHROMYCIN 500 MG PO TABS: 500.0000 mg | ORAL_TABLET | Freq: Two times a day (BID) | ORAL | 0 refills | Status: DC

## 2017-02-18 NOTE — Progress Notes (Signed)
Subjective Chief Complaint  Patient presents with  . Allergic Rhinitis     sinus  pressure x1 week and  weight gain ,staying tiried alot ,    Here for multiple issues.  Here for sinus infection.   She reports sinus pressure x 1 week, right side of nose hurts even to touch, can't breath through nose at all in the day.  Nothing coming out.  Not a lot of sneezing.  Some eye burning sensation in left eye.   No sore throat, no cough.  No teeth pain.    Gaining weight despite trying to eat healthy and exercising regularly.  Works at a daycare, always on the move.  Here for recheck on thyroid.   Feels some fatigue, gaining weight, wants level checked.  Compliant with medication  Wants STD panel.  There was concern for husband work exposure, and she and him both want to get tested for STD.   She does have recent creamy vaginal discharge and itching, worried about yeast infection.   Mild discharge, no sores, no other concern for STD.  Past Medical History:  Diagnosis Date  . Anemia    Chronic problem for patient.  Followed by Heme, receives IV iron transfusions secondary to inability to tolerate PO supplements.    . Arthritis    bilateral knees  . Headache    Migraines  . Hypothyroidism    post-radiation at age 71  . Shortness of breath dyspnea    Current Outpatient Prescriptions on File Prior to Visit  Medication Sig Dispense Refill  . SYNTHROID 125 MCG tablet Take 1 tablet (125 mcg total) by mouth daily before breakfast. 90 tablet 1   No current facility-administered medications on file prior to visit.    ROS as in subjective   Objective: BP 122/80   Pulse 68   Wt 183 lb (83 kg)   LMP 11/07/2015   SpO2 98%   BMI 31.41 kg/m   Wt Readings from Last 3 Encounters:  02/18/17 183 lb (83 kg)  11/19/16 181 lb 6.4 oz (82.3 kg)  10/28/16 179 lb 12.8 oz (81.6 kg)   General appearance: Alert, WD/WN, no distress                             Skin: warm, no rash   Head: + right maxillary sinus tenderness,                            Eyes: conjunctiva normal, corneas clear, PERRLA                            Ears: flatTMs, external ear canals normal                          Nose: septum midline, turbinates swollen, with erythema and mucoid discharge             Mouth/throat: MMM, tongue normal, mild pharyngeal erythema                           Neck: supple, no adenopathy, no thyromegaly, non tender                          Heart: RRR, normal S1, S2, no murmurs  Lungs: CTA bilaterally, no wheezes, rales, or rhonchi  GU: deferred, she did self wet prep    Assessment: Encounter Diagnoses  Name Primary?  . Acute non-recurrent sinusitis, unspecified location Yes  . Vaginal discharge   . Screen for STD (sexually transmitted disease)   . Hypothyroidism, unspecified type   . Weight gain     Plan: Sinusitis - begin biaxin, rest, hydrate well, nasal saline, can c/t mucinex DM  Vaginal discharge - discussed diagnosis, treatment, begin diflucan  STD screen today at her request  hypothyroidism - labs today, compliant with medication  Weight gain - c/t efforts with healthy diet and exercise as discussed.  Can consider medication or weight loss study. She will let me know.   Valynn was seen today for allergic rhinitis .  Diagnoses and all orders for this visit:  Acute non-recurrent sinusitis, unspecified location  Vaginal discharge -     HIV antibody -     RPR -     GC/Chlamydia Probe Amp -     POCT Wet Prep [employer] [facility])  Screen for STD (sexually transmitted disease) -     HIV antibody -     RPR -     GC/Chlamydia Probe Amp  Hypothyroidism, unspecified type -     T4, free -     TSH  Weight gain -     T4, free -     TSH

## 2017-02-18 NOTE — Telephone Encounter (Signed)
I reviewed the weight loss supplement she showed me.   I would recommend she not take this.  These things are not FDA regulated, likely doesn't work, is quite expensive, and there is potential harm taking it.

## 2017-02-18 NOTE — Telephone Encounter (Signed)
Pt was notified of this 

## 2017-02-19 LAB — T4, FREE: Free T4: 1.2 ng/dL (ref 0.8–1.8)

## 2017-02-19 LAB — TSH: TSH: 5.71 m[IU]/L — AB

## 2017-02-19 LAB — RPR

## 2017-02-19 LAB — HIV ANTIBODY (ROUTINE TESTING W REFLEX): HIV: NONREACTIVE

## 2017-02-21 ENCOUNTER — Other Ambulatory Visit: Payer: Self-pay | Admitting: Medical

## 2017-02-21 LAB — GC/CHLAMYDIA PROBE AMP
CT PROBE, AMP APTIMA: NOT DETECTED
GC PROBE AMP APTIMA: NOT DETECTED

## 2017-02-21 MED ORDER — LEVOTHYROXINE SODIUM 137 MCG PO TABS: 137.0000 ug | ORAL_TABLET | Freq: Every day | ORAL | 3 refills | Status: DC

## 2017-02-22 ENCOUNTER — Telehealth: Payer: Self-pay

## 2017-02-22 NOTE — Telephone Encounter (Signed)
Called a spoke with pt about results and she wants to the prep for weight loss.

## 2017-02-22 NOTE — Telephone Encounter (Signed)
Ok refer to [employer] Prep program

## 2017-02-23 ENCOUNTER — Other Ambulatory Visit: Payer: Self-pay

## 2017-02-23 DIAGNOSIS — E039 Hypothyroidism, unspecified: Secondary | ICD-10-CM

## 2017-02-23 NOTE — Telephone Encounter (Signed)
Sent referral 

## 2017-03-02 ENCOUNTER — Encounter: Payer: Self-pay | Admitting: Gynecology

## 2017-03-07 ENCOUNTER — Telehealth: Payer: Self-pay

## 2017-03-07 NOTE — Telephone Encounter (Signed)
Spoke to Miranda Fowler both today and on 02/25/2017.  She is interested in participating in the PREP but isn't able to afford the $100 program fee.  I will check to see if there is a way to provide financial assistance and get back to her about that.  She was appreciative and said she would participate if financial assistance was provided.

## 2017-03-28 ENCOUNTER — Other Ambulatory Visit: Payer: Self-pay

## 2017-05-03 ENCOUNTER — Encounter: Payer: Self-pay | Admitting: Family Medicine

## 2017-05-03 ENCOUNTER — Ambulatory Visit (INDEPENDENT_AMBULATORY_CARE_PROVIDER_SITE_OTHER): Payer: BLUE CROSS/BLUE SHIELD | Admitting: Family Medicine

## 2017-05-03 VITALS — BP 118/72 | HR 68 | Ht 63.0 in | Wt 182.0 lb

## 2017-05-03 DIAGNOSIS — B3731 Acute candidiasis of vulva and vagina: Secondary | ICD-10-CM

## 2017-05-03 DIAGNOSIS — B373 Candidiasis of vulva and vagina: Secondary | ICD-10-CM

## 2017-05-03 LAB — POCT WET PREP (WET MOUNT): CLUE CELLS WET PREP WHIFF POC: NEGATIVE

## 2017-05-03 MED ORDER — FLUCONAZOLE 150 MG PO TABS: 150.0000 mg | ORAL_TABLET | Freq: Once | ORAL | 0 refills | Status: AC

## 2017-05-03 NOTE — Patient Instructions (Signed)
  Take the first diflucan tablet today.  Only use the second tablet a week later (next Tuesday) if you have persistent yeast symptoms (discharge and itching).  If you don't, you can save this last pill to treat your next yeast infection.

## 2017-05-03 NOTE — Progress Notes (Signed)
Chief Complaint  Patient presents with  . Vaginitis    vaginal itching since Friday.   4 days she started noticing white, yeasty discharge when wiping after voiding.  Not noticing any significant discharge in her panties. She also has been having vaginal itching. She took a 1 -day course of Monistat 3 days ago, along with also trying Azo. She continues to have a lot of itching and white discharge; itching has gotten worse, hasn't seen any improvement  No recent ABX (just in May, see below). She denies concerns for STD--monogamous relationship.  She believes her last yeast infection was in January. Review of her chart shows she was diagnosed with yeast infection at her visit in May (she did a self-swab for wet prep, which showed yeast). She really didn't recall this, nor recalled if she took one, two or even filled the diflucan from the pharmacy.  I suspect that she took at least one, given that she had complaints c/w yeast infection, and it was confirmed on wet prep at visit.  PMH, PSH, SH reviewed  Outpatient Encounter Prescriptions as of 05/03/2017  Medication Sig  . levothyroxine (SYNTHROID) 137 MCG tablet Take 1 tablet (137 mcg total) by mouth daily before breakfast.  . fluconazole (DIFLUCAN) 150 MG tablet 1 tablet now, may repeat in 1 week (Patient not taking: Reported on 05/03/2017)  . [DISCONTINUED] clarithromycin (BIAXIN) 500 MG tablet Take 1 tablet (500 mg total) by mouth 2 (two) times daily.  . [DISCONTINUED] Dextromethorphan-Guaifenesin (Verlot DM PO) Take by mouth.   No facility-administered encounter medications on file as of 05/03/2017.    No Known Allergies  ROS: no fever, chills. Denies dysuria (just some discomfort/irritation when wiping), urgency/frequency.  No fever, chills, URI, cough, chest pain, bleeding, bruising, rash. No nausea, vomiting, diarrhea.  PHYSICAL EXAM:  BP 118/72 (BP Location: Left Arm, Patient Position: Sitting, Cuff Size: Normal)   Pulse 68   Ht 5'  3" (1.6 m)   Wt 182 lb (82.6 kg)   LMP 11/07/2015   BMI 32.24 kg/m   Well appearing, pleasant female in no distress Heart: regular rate and rhythm Lungs: clear bilaterally Abdomen: soft, nontender, no mass GU: Normal external genitalia without significant erythema, swelling, no lesions or other abnormality. Speculum exam shows significant thick white discharge, adhering to vaginal wall. KOH and wet prep-- +hyphae and red cells. No significant bacterial PMNs or clue cells.  ASSESSMENT/PLAN:  Yeast vaginitis - Plan: POCT Wet Prep ([facility]), fluconazole (DIFLUCAN) 150 MG tablet   Take the first diflucan tablet today.  Only use the second tablet a week later (next Tuesday) if you have persistent yeast symptoms (discharge and itching).  If you don't, you can save this last pill to treat your next yeast infection.

## 2017-05-09 ENCOUNTER — Encounter (HOSPITAL_COMMUNITY): Payer: Self-pay | Admitting: Oncology

## 2017-05-09 ENCOUNTER — Emergency Department (HOSPITAL_COMMUNITY)
Admission: EM | Admit: 2017-05-09 | Discharge: 2017-05-09 | Disposition: A | Discharge status: Home / Self Care | Payer: BLUE CROSS/BLUE SHIELD | Attending: Emergency Medicine | Admitting: Emergency Medicine

## 2017-05-09 ENCOUNTER — Emergency Department (HOSPITAL_COMMUNITY): Payer: BLUE CROSS/BLUE SHIELD

## 2017-05-09 DIAGNOSIS — M25531 Pain in right wrist: Secondary | ICD-10-CM | POA: Diagnosis not present

## 2017-05-09 DIAGNOSIS — Z79899 Other long term (current) drug therapy: Secondary | ICD-10-CM | POA: Insufficient documentation

## 2017-05-09 DIAGNOSIS — E039 Hypothyroidism, unspecified: Secondary | ICD-10-CM | POA: Insufficient documentation

## 2017-05-09 NOTE — ED Triage Notes (Signed)
Pt c/o right wrist pain x 1 week.  Pt denies injury to wrist.  Per pt she had taken meloxicam and ibuprofen w/o relief of pain.  States that the pain is now keeping her awake at night.  Pt rates pain 8/10, aching in nature.

## 2017-05-09 NOTE — ED Provider Notes (Signed)
[facility] DEPT Provider Note   CSN: [id number] Arrival date & time: 05/09/17  0409     History   Chief Complaint Chief Complaint  Patient presents with  . Wrist Pain    HPI Miranda Fowler is a 36 y.o. female.  Patient presents to the Ed with a chief complaint of right wrist pain.  She states that the pain has been ongoing for the past week.  She reports that it is gradually worsening.  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  She has tried taking pain medicine with no relief.  She denies any trauma or other injury.  She denies any fevers, chills, skin redness, or rash.   The history is provided by the patient. No language interpreter was used.    Past Medical History:  Diagnosis Date  . Anemia    Chronic problem for patient.  Followed by Heme, receives IV iron transfusions secondary to inability to tolerate PO supplements.    . Arthritis    bilateral knees  . Headache    Migraines  . Hypothyroidism    post-radiation at age 72  . Shortness of breath dyspnea     Patient Active Problem List   Diagnosis Date Noted  . Hyperglycemia 10/28/2016  . Routine general medical examination at a health care facility 10/28/2016  . Daytime somnolence 07/08/2016  . Snoring 07/08/2016  . Chronic fatigue 07/08/2016  . Knee pain, bilateral 07/08/2016  . Influenza vaccination declined 07/08/2016  . Postoperative state 04/06/2016  . History of transfusion 02/09/2016  . Foot swelling 01/08/2016  . Shortness of breath   . Anemia 11/07/2015  . Right shoulder pain 04/01/2014  . Screen for STD (sexually transmitted disease) 11/05/2013  . Hypothyroidism 04/06/2010  . Iron deficiency anemia 04/06/2010    Past Surgical History:  Procedure Laterality Date  . BILATERAL SALPINGECTOMY Bilateral 2008   pt denies  . CESAREAN SECTION    . CYSTOSCOPY N/A 04/06/2016   Procedure: CYSTOSCOPY;  Surgeon: Terrance Mass, MD;   Location: [facility];  Service: Gynecology;  Laterality: N/A;  . LAPAROSCOPIC ASSISTED VAGINAL HYSTERECTOMY N/A 04/06/2016   Procedure: LAPAROSCOPIC ASSISTED VAGINAL HYSTERECTOMY,  Bilateral Salpingectomy;  Surgeon: Terrance Mass, MD;  Location: [facility];  Service: Gynecology;  Laterality: N/A;  . TUBAL LIGATION      OB History    Gravida Para Term Preterm AB Living   4 2 0 2 0 4   SAB TAB Ectopic Multiple Live Births   0 0 0 0         Home Medications    Prior to Admission medications   Medication Sig Start Date End Date Taking? Authorizing Provider  levothyroxine (SYNTHROID) 137 MCG tablet Take 1 tablet (137 mcg total) by mouth daily before breakfast. 02/21/17   Tysinger, Camelia Eng, PA-C    Family History Family History  Problem Relation Age of Onset  . Diabetes Maternal Grandmother   . Cancer Paternal Aunt        breast  . Heart disease Paternal Grandmother   . Anemia Mother   . Lupus Mother   . Sickle cell trait Mother   . Hypertension Father   . Sickle cell trait Sister   . Diabetes Maternal Grandfather   . Cancer Paternal Aunt        cervical    Social History Social History  Substance Use Topics  . Smoking status: Never Smoker  .  Smokeless tobacco: Never Used  . Alcohol use No     Comment: wine     Allergies   Patient has no known allergies.   Review of Systems Review of Systems  All other systems reviewed and are negative.    Physical Exam Updated Vital Signs BP 110/81   Pulse 67   Temp 97.9 F (36.6 C) (Oral)   Resp 12   Wt 81.6 kg (180 lb)   LMP 11/07/2015   SpO2 100%   BMI 31.89 kg/m   Physical Exam Nursing note and vitals reviewed.  Constitutional: Pt appears well-developed and well-nourished. No distress.  HENT:  Head: Normocephalic and atraumatic.  Eyes: Conjunctivae are normal.  Neck: Normal range of motion.  Cardiovascular: Normal rate, regular rhythm. Intact distal pulses.   Capillary refill < 3 sec.  Pulmonary/Chest: Effort  normal and breath sounds normal.  Musculoskeletal:  Right wrist Pt exhibits pain with flexion, positive Tinel and Phalen, no bony abnormality or deformity.   ROM: 4/5 limited by pain  Strength: 4/5 limited by pain  Neurological: Pt  is alert. Coordination normal.  Sensation: 5/5 Skin: Skin is warm and dry. Pt is not diaphoretic.  No evidence of open wound or skin tenting No sign of cellulitis or septic joint Psychiatric: Pt has a normal mood and affect.     ED Treatments / Results  Labs (all labs ordered are listed, but only abnormal results are displayed) Labs Reviewed - No data to display  EKG  EKG Interpretation None       Radiology Dg Wrist Complete Right  Result Date: 05/09/2017 CLINICAL DATA:  Nontraumatic right wrist pain for 4 days. EXAM: RIGHT WRIST - COMPLETE 3+ VIEW COMPARISON:  None. FINDINGS: There is no evidence of fracture or dislocation. There is no evidence of arthropathy or other focal bone abnormality. Soft tissues are unremarkable. IMPRESSION: Negative. Electronically Signed   By: Andreas Newport M.D.   On: 05/09/2017 05:56    Procedures Procedures (including critical care time)  Medications Ordered in ED Medications - No data to display   Initial Impression / Assessment and Plan / ED Course  I have reviewed the triage vital signs and the nursing notes.  Pertinent labs & imaging results that were available during my care of the patient were reviewed by me and considered in my medical decision making (see chart for details).     Patient X-Ray negative for obvious fracture or dislocation.  Symptoms seem consistent with carpal tunnel.   Pt advised to follow up with orthopedics. Patient given velcro splint while in ED, conservative therapy recommended and discussed. Patient will be discharged home & is agreeable with above plan. Returns precautions discussed. Pt appears safe for discharge.   Final Clinical Impressions(s) / ED Diagnoses   Final  diagnoses:  Right wrist pain    New Prescriptions New Prescriptions   No medications on file     Montine Circle, Hershal Coria 05/09/17 9622    Merryl Hacker, MD 05/09/17 [phone]

## 2017-05-16 ENCOUNTER — Encounter: Payer: Self-pay | Admitting: Medical

## 2017-05-16 ENCOUNTER — Ambulatory Visit (INDEPENDENT_AMBULATORY_CARE_PROVIDER_SITE_OTHER): Payer: BLUE CROSS/BLUE SHIELD | Admitting: Medical

## 2017-05-16 VITALS — BP 112/70 | HR 62 | Wt 182.2 lb

## 2017-05-16 DIAGNOSIS — Z202 Contact with and (suspected) exposure to infections with a predominantly sexual mode of transmission: Secondary | ICD-10-CM

## 2017-05-16 DIAGNOSIS — E039 Hypothyroidism, unspecified: Secondary | ICD-10-CM

## 2017-05-16 MED ORDER — AZITHROMYCIN 500 MG PO TABS: 1000.0000 mg | ORAL_TABLET | Freq: Once | ORAL | 0 refills | Status: AC

## 2017-05-16 NOTE — Progress Notes (Signed)
Subjective: Chief Complaint  Patient presents with  . check for std    check for std , she was expose to by her husband    Here for STD exposure.  She is married, but her husband told her last week that he went to the doctor and was + for STD. She said he reported chlamydia.  He has received treatment, but this is her first visit for exposure.  She was seen a few weeks ago for yeast infection, had itching , discharge, odor, took medication for yeast.  Was improving some but not completely resolved, so wonders if her issues is in fact STD.   She is s/p partial hysterectomy.     wants thyroid level checked, compliant with medication without c/o.  Past Medical History:  Diagnosis Date  . Anemia    Chronic problem for patient.  Followed by Heme, receives IV iron transfusions secondary to inability to tolerate PO supplements.    . Arthritis    bilateral knees  . Headache    Migraines  . Hypothyroidism    post-radiation at age 89  . Shortness of breath dyspnea    Current Outpatient Prescriptions on File Prior to Visit  Medication Sig Dispense Refill  . levothyroxine (SYNTHROID) 137 MCG tablet Take 1 tablet (137 mcg total) by mouth daily before breakfast. 30 tablet 3   No current facility-administered medications on file prior to visit.    ROS as in subjective  Objective: BP 112/70   Pulse 62   Wt 182 lb 3.2 oz (82.6 kg)   LMP 11/07/2015   SpO2 95%   BMI 32.28 kg/m   General appearance: alert, no distress, WD/WN,  Neck: supple, no lymphadenopathy, no thyromegaly, no masses Heart: RRR, normal S1, S2, no murmurs Lungs: CTA bilaterally, no wheezes, rhonchi, or rales Abdomen: +bs, soft, non tender, non distended, no masses, no hepatomegaly, no splenomegaly Pulses: 2+ symmetric, upper and lower extremities, normal cap refill Gyn - deferred    Assessment: Encounter Diagnoses  Name Primary?  . Exposure to STD Yes  . Hypothyroidism, unspecified type      Plan: Exposure to  STD - expressed my concern for her relationship situation.   Advised she begin Azithromyin for supposed chlamydia infection, labs today, and advised abstinence and recheck  In 2 weeks.  Otherwise recheck 18mo for repeat STD testing.  discussed safe sex in general  Hypothyroidism - c/t same medication, labs today  Shernell was seen today for check for std.  Diagnoses and all orders for this visit:  Exposure to STD -     HIV antibody -     RPR -     GC/Chlamydia Probe Amp -     POCT Wet Prep [facility])  Hypothyroidism, unspecified type -     TSH  Other orders -     azithromycin (ZITHROMAX) 500 MG tablet; Take 2 tablets (1,000 mg total) by mouth once.

## 2017-05-17 LAB — GC/CHLAMYDIA PROBE AMP
CT PROBE, AMP APTIMA: NOT DETECTED
GC PROBE AMP APTIMA: NOT DETECTED

## 2017-05-17 LAB — RPR

## 2017-05-17 LAB — HIV ANTIBODY (ROUTINE TESTING W REFLEX): HIV: NONREACTIVE

## 2017-05-17 LAB — TSH: TSH: 2.02 mIU/L

## 2017-05-20 LAB — POCT WET PREP (WET MOUNT): SOURCE WET PREP POC: NEGATIVE

## 2017-05-30 ENCOUNTER — Encounter: Payer: Self-pay | Admitting: Medical

## 2017-05-30 ENCOUNTER — Ambulatory Visit (INDEPENDENT_AMBULATORY_CARE_PROVIDER_SITE_OTHER): Payer: BLUE CROSS/BLUE SHIELD | Admitting: Medical

## 2017-05-30 VITALS — BP 114/70 | HR 59 | Resp 12 | Ht 63.0 in | Wt 177.0 lb

## 2017-05-30 DIAGNOSIS — Z202 Contact with and (suspected) exposure to infections with a predominantly sexual mode of transmission: Secondary | ICD-10-CM | POA: Diagnosis not present

## 2017-05-30 NOTE — Progress Notes (Signed)
Subjective: Chief Complaint  Patient presents with  . Follow-up    2 week    Here for 2 wk f/u.  At last visit she was told by husband he was + for chlamydia.  We treated empirically with azithromycin which she took.  husband was also treated.  They are trying to work thinks out as there certainly was conflict and emotional turmoil after the revelation that he had an affair. She denies any current vaginal or urinary symptoms.  No other aggravating or relieving factors. No other complaint.  Objective: BP 114/70   Pulse (!) 59   Resp 12   Ht 5\' 3"  (1.6 m)   Wt 177 lb (80.3 kg)   LMP 11/07/2015   SpO2 98%   BMI 31.35 kg/m   Gen: wd, wn, nad   Assessment: Encounter Diagnoses  Name Primary?  . Exposure to chlamydia Yes  . Venereal disease contact     Plan: Repeat testing today.  She took the azithromycin.  Counseled on safe sex, advised counseling for their relationship, and advised repeat full STD testing at her next physical with me in 10/2017.  Miranda Fowler was seen today for follow-up.  Diagnoses and all orders for this visit:  Exposure to chlamydia -     GC/Chlamydia Probe Amp  Venereal disease contact -     GC/Chlamydia Probe Amp

## 2017-05-31 LAB — GC/CHLAMYDIA PROBE AMP
CT Probe RNA: NOT DETECTED
GC Probe RNA: NOT DETECTED

## 2017-07-12 ENCOUNTER — Encounter: Payer: Self-pay | Admitting: Podiatry

## 2017-07-12 ENCOUNTER — Ambulatory Visit (INDEPENDENT_AMBULATORY_CARE_PROVIDER_SITE_OTHER): Payer: BLUE CROSS/BLUE SHIELD

## 2017-07-12 ENCOUNTER — Ambulatory Visit (INDEPENDENT_AMBULATORY_CARE_PROVIDER_SITE_OTHER): Payer: BLUE CROSS/BLUE SHIELD | Admitting: Podiatry

## 2017-07-12 VITALS — BP 115/77 | HR 54 | Resp 18

## 2017-07-12 DIAGNOSIS — L989 Disorder of the skin and subcutaneous tissue, unspecified: Secondary | ICD-10-CM | POA: Diagnosis not present

## 2017-07-12 DIAGNOSIS — M19072 Primary osteoarthritis, left ankle and foot: Secondary | ICD-10-CM | POA: Diagnosis not present

## 2017-07-12 DIAGNOSIS — M659 Synovitis and tenosynovitis, unspecified: Secondary | ICD-10-CM | POA: Diagnosis not present

## 2017-07-12 DIAGNOSIS — M19071 Primary osteoarthritis, right ankle and foot: Secondary | ICD-10-CM | POA: Diagnosis not present

## 2017-07-12 NOTE — Progress Notes (Signed)
   Subjective:    Patient ID: Miranda Fowler, female    DOB: 1981/09/29, 36 y.o.   MRN: [id number]  HPI  36 year old female presents the office today for concerns of the painful callus the bottom of her left foot which is been ongoing for quite some time. She tries to get the area herself and bleeds when she does it. She states the area hurts with pressure in shoes. She denies tobacco any foreign abduction she denies any drainage or pus any swelling or redness. She also states that she gets some pain in the top of her foot she points along the dorsal medial aspect of the foot. She appears he had an injection as well as a boot which should resolve the symptoms and she currently no longer has any pain atop of her feet. She does have a history of an ankle fracture the right foot as well. She's having no issues with this. She has no other concerns today.    Review of Systems  All other systems reviewed and are negative.      Objective:   Physical Exam  General: AAO x3, NAD  Dermatological: Punctate annular hyperkeratotic lesion plantar lateral aspect of the midfoot on the left foot. Upon debrided there is no underlying ulceration, drainage or any signs of infection. There is no evidence of foreign body. There is no drainage or pus or any clinical signs of infection present. There is tenderness palpation to the longus area. There is no other open lesions or pre-ulcer lesions identified bilaterally.  Vascular: Dorsalis Pedis artery and Posterior Tibial artery pedal pulses are 2/4 bilateral with immedate capillary fill time. There is no pain with calf compression, swelling, warmth, erythema.   Neruologic: Grossly intact via light touch bilateral. Protective threshold with Semmes Wienstein monofilament intact to all pedal sites bilateral.   Musculoskeletal: There is no pain or restriction with ankle or subtalar joint range of motion bilaterally. There is no pain along the talonavicular joint but  subjective this is where she was getting tenderness previously. There is tenderness to hyperkeratotic lesion left foot with is no other areas of tenderness. MMT/S5.  Assessment: Porokeratosis: Skin lesion left foot; arthritis midfoot  Plan: -Treatment options discussed including all alternatives, risks, and complications -Etiology of symptoms were discussed -X-rays were obtained and reviewed with the patient. Skin markers utilized to identify an area of soft tissue mass in the left foot. There is no underlying foreign body is no calcification present. There is no evidence of acute fracture. Arthritic changes present in the talar navicular joint bilateral with left side worse than the right. -The skin lesion sharply debrided to the any complications or bleeding. Area was cleaned and a pad was placed followed by salicylic acid and a bandage. Post procedure tractions were discussed. Monitor for infection. -Regards the dorsal midfoot pain she is currently not having any symptoms. Continue to monitor reoccurrence. -Follow-up in 3 weeks or sooner if any issues are to arise. Call any questions concerns.  Celesta Gentile, DPM

## 2017-09-03 IMAGING — CR DG KNEE COMPLETE 4+V*R*
3 series · 3 of 3 positions shown · non-contrast
Comparison: None.

CLINICAL DATA: Bilateral knee pain, no known injury, initial
encounter

EXAM:
RIGHT KNEE - COMPLETE 4+ VIEW

[w knee ap right (1 of 2)]
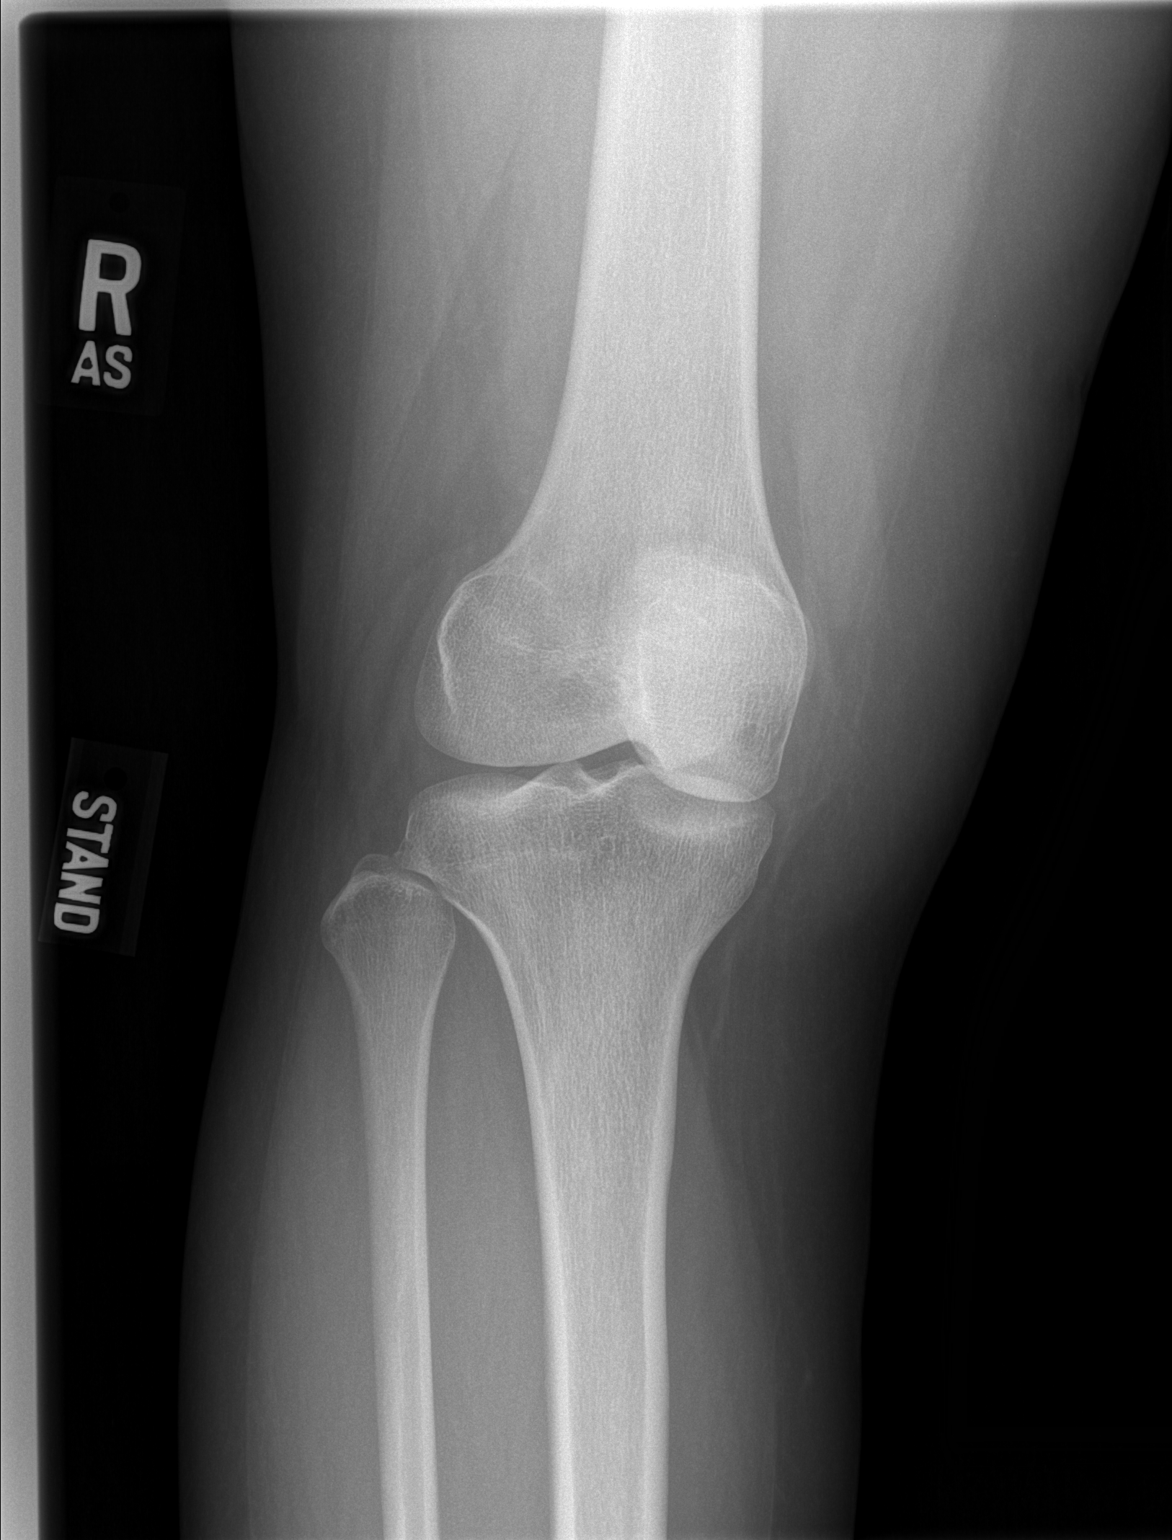

[w knee lat. right]
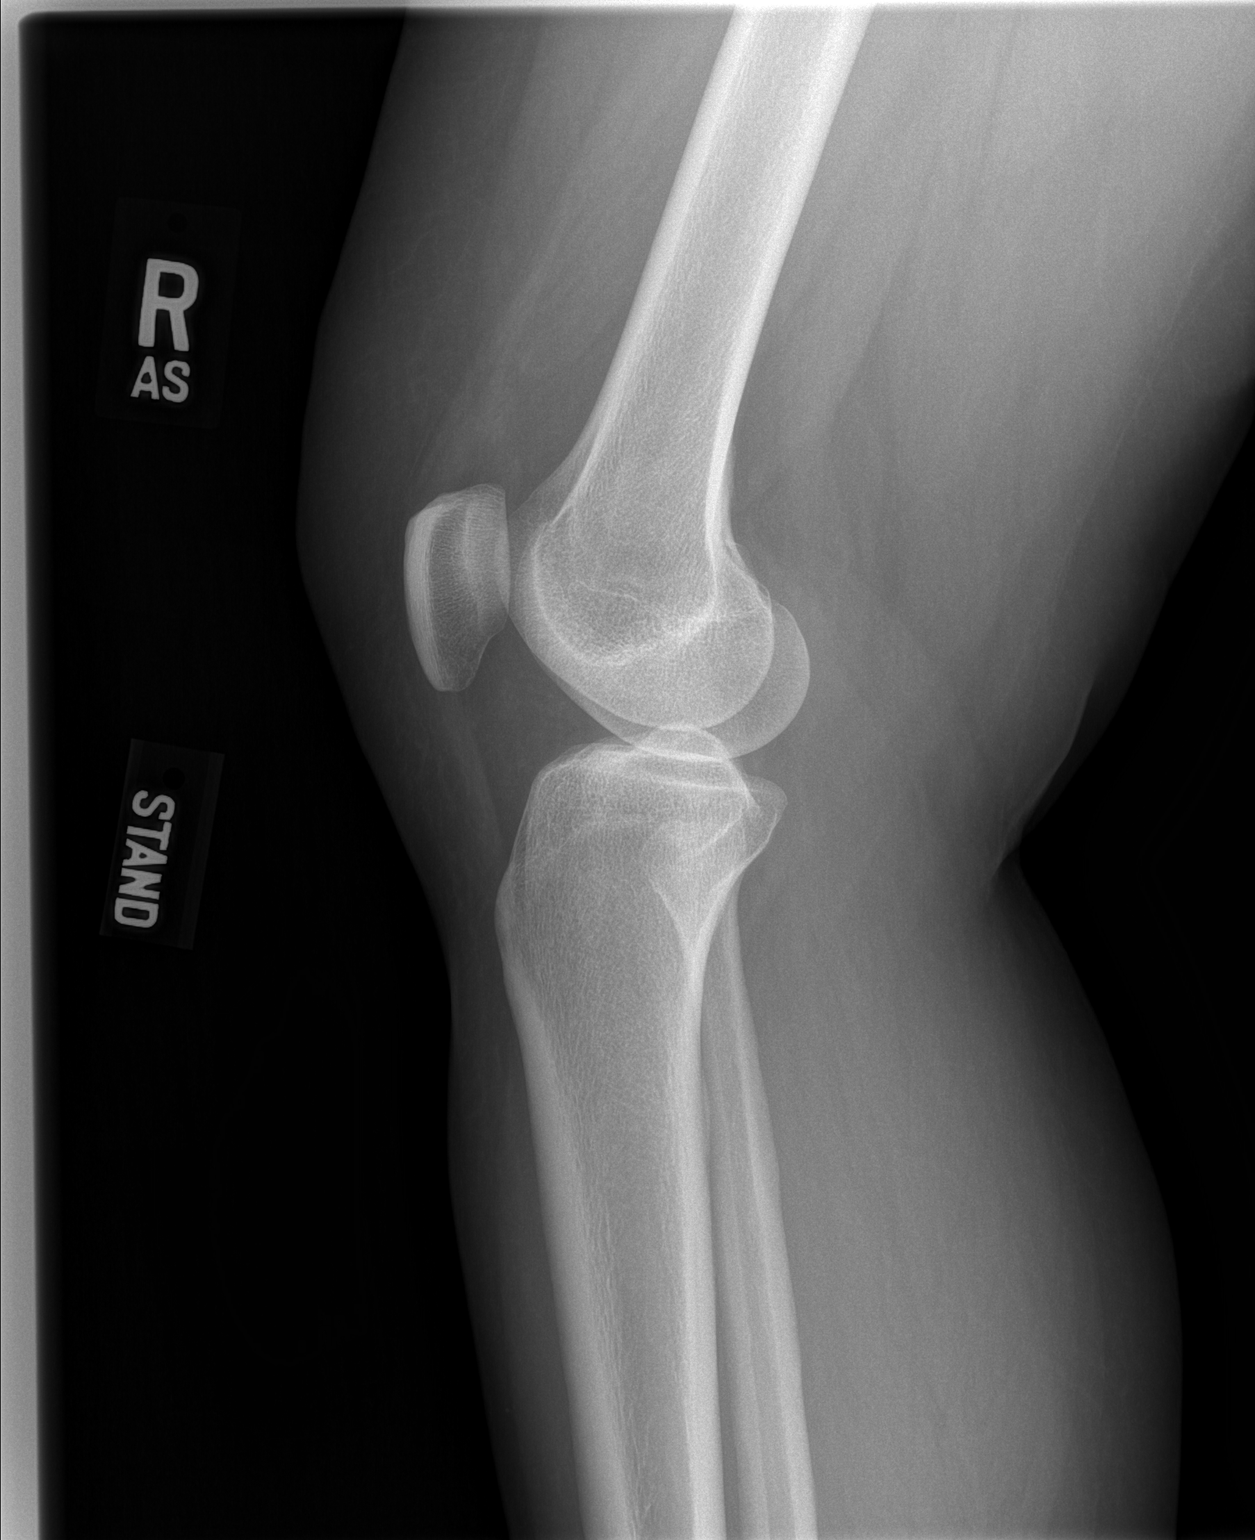

[w knee ap right (2 of 2)]
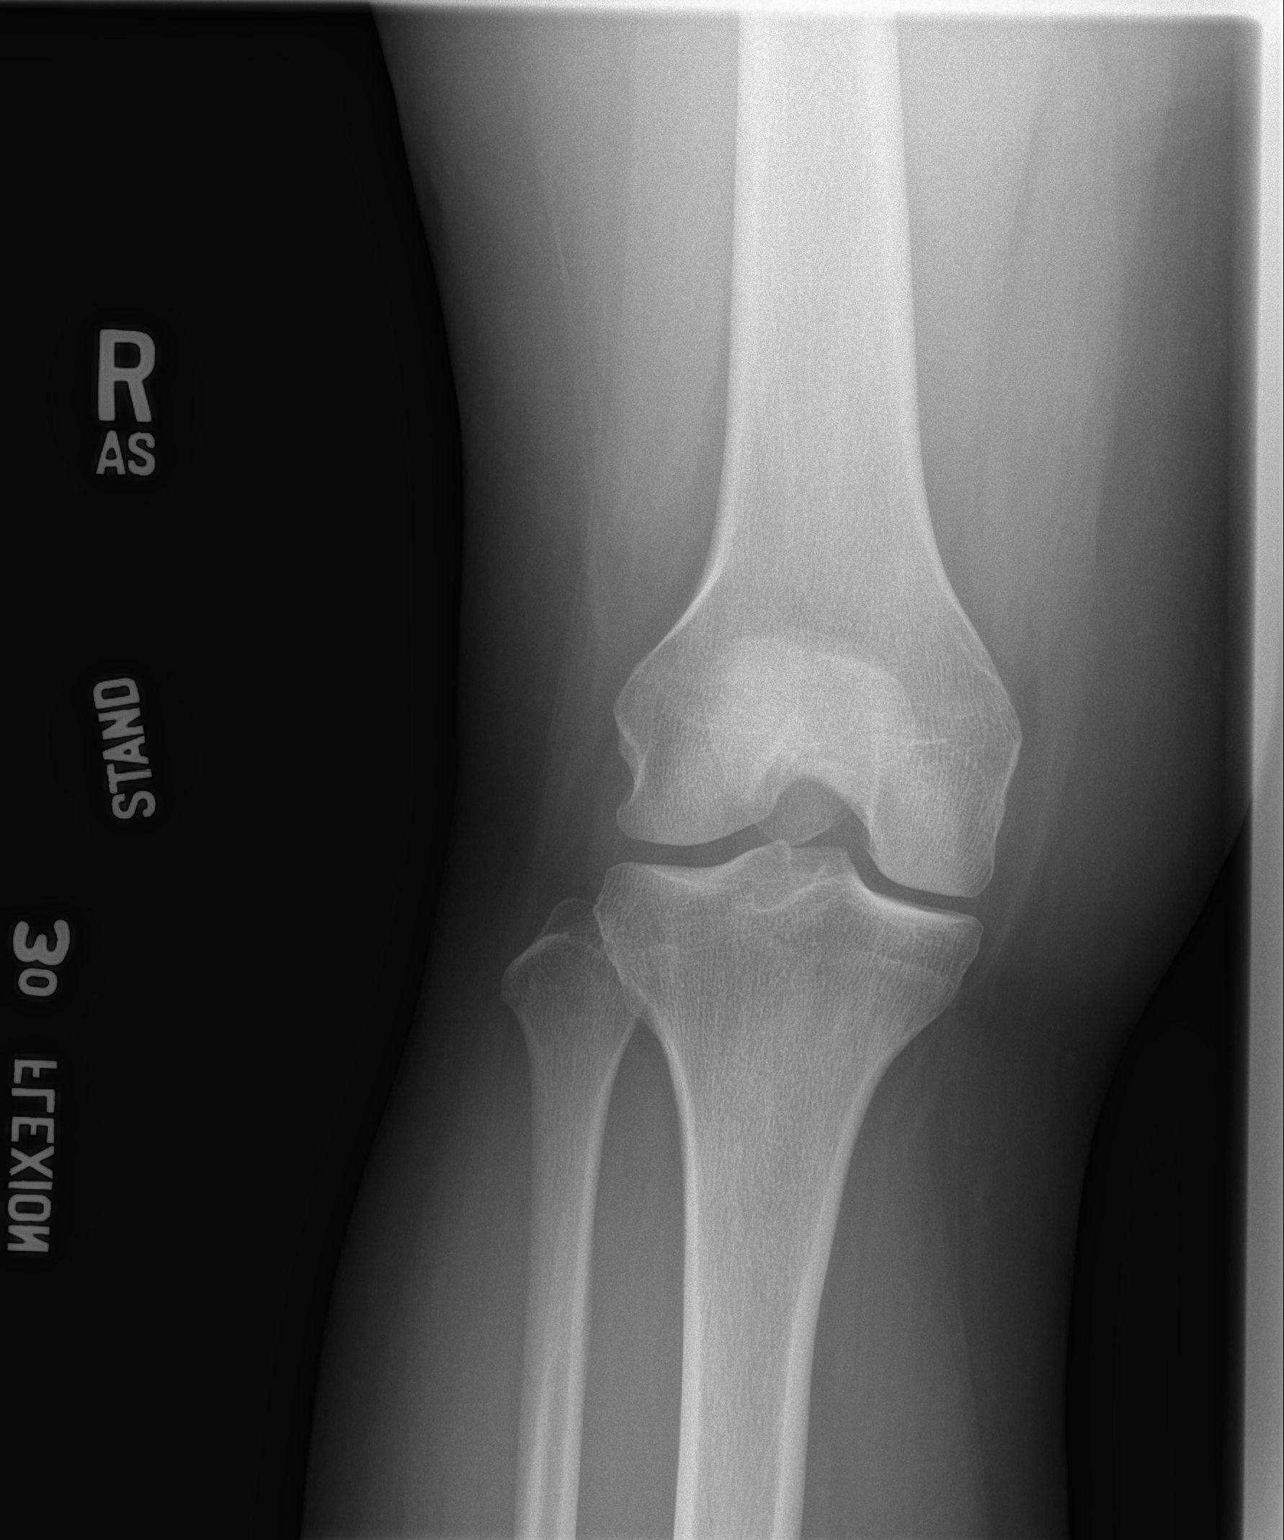

[3 of 3 positions shown; findings below may reference images not displayed]

FINDINGS: No evidence of fracture, dislocation, or joint effusion. No evidence
of arthropathy or other focal bone abnormality. Soft tissues are
unremarkable.
IMPRESSION: No acute abnormality noted.

## 2017-12-02 ENCOUNTER — Other Ambulatory Visit: Payer: Self-pay | Admitting: Medical

## 2017-12-13 ENCOUNTER — Encounter: Payer: BLUE CROSS/BLUE SHIELD | Admitting: Medical

## 2017-12-16 ENCOUNTER — Encounter: Payer: Self-pay | Admitting: Medical

## 2017-12-28 ENCOUNTER — Emergency Department
Admission: EM | Admit: 2017-12-28 | Discharge: 2017-12-28 | Disposition: A | Discharge status: Home / Self Care | Payer: BLUE CROSS/BLUE SHIELD | Attending: Student in an Organized Health Care Education/Training Program | Admitting: Student in an Organized Health Care Education/Training Program

## 2017-12-28 ENCOUNTER — Emergency Department: Payer: BLUE CROSS/BLUE SHIELD

## 2017-12-28 ENCOUNTER — Other Ambulatory Visit: Payer: Self-pay

## 2017-12-28 DIAGNOSIS — R0602 Shortness of breath: Secondary | ICD-10-CM | POA: Insufficient documentation

## 2017-12-28 DIAGNOSIS — R079 Chest pain, unspecified: Secondary | ICD-10-CM | POA: Diagnosis not present

## 2017-12-28 DIAGNOSIS — E039 Hypothyroidism, unspecified: Secondary | ICD-10-CM | POA: Diagnosis not present

## 2017-12-28 DIAGNOSIS — R0789 Other chest pain: Secondary | ICD-10-CM | POA: Diagnosis not present

## 2017-12-28 DIAGNOSIS — Z79899 Other long term (current) drug therapy: Secondary | ICD-10-CM | POA: Diagnosis not present

## 2017-12-28 DIAGNOSIS — R519 Headache, unspecified: Secondary | ICD-10-CM

## 2017-12-28 DIAGNOSIS — R51 Headache: Secondary | ICD-10-CM | POA: Insufficient documentation

## 2017-12-28 LAB — TROPONIN I: Troponin I: <0.03 ng/mL (ref <0.03)

## 2017-12-28 LAB — BASIC METABOLIC PANEL
Anion gap: 6 (ref 5–15)
BUN: 22 mg/dL — ABNORMAL HIGH (ref 6–20)
CHLORIDE: 107 mmol/L (ref 101–111)
CO2: 26 mmol/L (ref 22–32)
CREATININE: 1.01 mg/dL — AB (ref 0.44–1.00)
Calcium: 9.4 mg/dL (ref 8.9–10.3)
GFR calc Af Amer: >60 mL/min (ref >60)
GFR calc non Af Amer: >60 mL/min (ref >60)
Glucose, Bld: 103 mg/dL — ABNORMAL HIGH (ref 65–99)
POTASSIUM: 3.9 mmol/L (ref 3.5–5.1)
SODIUM: 139 mmol/L (ref 135–145)

## 2017-12-28 LAB — CBC
HCT: 37.1 % (ref 35.0–47.0)
Hemoglobin: 12.3 g/dL (ref 12.0–16.0)
MCH: 33.4 pg (ref 26.0–34.0)
MCHC: 33.1 g/dL (ref 32.0–36.0)
MCV: 100.7 fL — AB (ref 80.0–100.0)
PLATELETS: 270 10*3/uL (ref 150–440)
RBC: 3.68 MIL/uL — AB (ref 3.80–5.20)
RDW: 12 % (ref 11.5–14.5)
WBC: 4.6 10*3/uL (ref 3.6–11.0)

## 2017-12-28 MED ORDER — METOCLOPRAMIDE HCL 10 MG PO TABS
ORAL_TABLET | ORAL | Status: AC
  Administered 2017-12-28: 10 mg
  Filled 2017-12-28: qty 1

## 2017-12-28 MED ORDER — ACETAMINOPHEN 500 MG PO TABS
ORAL_TABLET | ORAL | Status: AC
  Filled 2017-12-28: qty 2

## 2017-12-28 MED ORDER — PROCHLORPERAZINE EDISYLATE 5 MG/ML IJ SOLN
INTRAMUSCULAR | Status: AC
  Filled 2017-12-28: qty 2

## 2017-12-28 MED ORDER — ACETAMINOPHEN 500 MG PO TABS
1000.0000 mg | ORAL_TABLET | Freq: Once | ORAL | Status: AC
  Administered 2017-12-28: 1000 mg via ORAL

## 2017-12-28 MED ORDER — PROCHLORPERAZINE MALEATE 10 MG PO TABS
10.0000 mg | ORAL_TABLET | Freq: Once | ORAL | Status: DC
  Filled 2017-12-28: qty 1

## 2017-12-28 NOTE — ED Notes (Signed)
Pt reports chest pain and headache for 1 day.  No n/v  States pain in center of chest.  No sob.  Pt also reports no otc meds for headache.  Pt alert.  Speech clear.

## 2017-12-28 NOTE — ED Provider Notes (Signed)
[facility] Emergency Department Provider Note    First MD Initiated Contact with Patient 12/28/17 2103     (approximate)  I have reviewed the triage vital signs and the nursing notes.   HISTORY  Chief Complaint Chest Pain    HPI Miranda Fowler is a 37 y.o. female returns to the ER with chief complaint of chest pain and headache since 3 PM today.  Also felt some shortness of breath.  States that the pain would come and go.  Chest pain lasts only a few moments resolved.  Denies any pleuritic pain.  No recent fevers.  She does not smoke.  No pain shooting or radiating through to her back.  This is a headache was gradual in onset and preceded the chest pain.  Does have a history of migraine and this feels similar.  Is left-sided.  No neck pain.  Not worse headache of her life.  Past Medical History:  Diagnosis Date  . Anemia    Chronic problem for patient.  Followed by Heme, receives IV iron transfusions secondary to inability to tolerate PO supplements.    . Arthritis    bilateral knees  . Headache    Migraines  . Hypothyroidism    post-radiation at age 67  . Shortness of breath dyspnea    Family History  Problem Relation Age of Onset  . Diabetes Maternal Grandmother   . Cancer Paternal Aunt        breast  . Heart disease Paternal Grandmother   . Anemia Mother   . Lupus Mother   . Sickle cell trait Mother   . Hypertension Father   . Sickle cell trait Sister   . Diabetes Maternal Grandfather   . Cancer Paternal Aunt        cervical   Past Surgical History:  Procedure Laterality Date  . BILATERAL SALPINGECTOMY Bilateral 2008   pt denies  . CESAREAN SECTION    . CYSTOSCOPY N/A 04/06/2016   Procedure: CYSTOSCOPY;  Surgeon: Terrance Mass, MD;  Location: [facility];  Service: Gynecology;  Laterality: N/A;  . LAPAROSCOPIC ASSISTED VAGINAL HYSTERECTOMY N/A 04/06/2016   Procedure: LAPAROSCOPIC ASSISTED VAGINAL HYSTERECTOMY,  Bilateral  Salpingectomy;  Surgeon: Terrance Mass, MD;  Location: [facility];  Service: Gynecology;  Laterality: N/A;  . TUBAL LIGATION     Patient Active Problem List   Diagnosis Date Noted  . Hyperglycemia 10/28/2016  . Routine general medical examination at a health care facility 10/28/2016  . Daytime somnolence 07/08/2016  . Snoring 07/08/2016  . Chronic fatigue 07/08/2016  . Knee pain, bilateral 07/08/2016  . Influenza vaccination declined 07/08/2016  . Postoperative state 04/06/2016  . History of transfusion 02/09/2016  . Foot swelling 01/08/2016  . Shortness of breath   . Anemia 11/07/2015  . Right shoulder pain 04/01/2014  . Screen for STD (sexually transmitted disease) 11/05/2013  . Hypothyroidism 04/06/2010  . Iron deficiency anemia 04/06/2010      Prior to Admission medications   Medication Sig Start Date End Date Taking? Authorizing Provider  levothyroxine (SYNTHROID) 137 MCG tablet Take 1 tablet (137 mcg total) by mouth daily before breakfast. 02/21/17   Tysinger, Camelia Eng, PA-C  SYNTHROID 125 MCG tablet TAKE 1 TABLET BY MOUTH ONCE DAILY BEFORE BREAKFAST 12/02/17   Tysinger, Camelia Eng, PA-C    Allergies Patient has no known allergies.    Social History Social History   Tobacco Use  . Smoking status: Never Smoker  . Smokeless  tobacco: Never Used  Substance Use Topics  . Alcohol use: No    Alcohol/week: 0.0 oz    Comment: wine  . Drug use: No    Review of Systems Patient denies headaches, rhinorrhea, blurry vision, numbness, shortness of breath, chest pain, edema, cough, abdominal pain, nausea, vomiting, diarrhea, dysuria, fevers, rashes or hallucinations unless otherwise stated above in HPI. ____________________________________________   PHYSICAL EXAM:  VITAL SIGNS: Vitals:   12/28/17 2014  BP: 131/81  Pulse: 64  Resp: 18  Temp: 98.4 F (36.9 C)  SpO2: 100%    Constitutional: Alert and oriented. Well appearing and in no acute distress. Eyes: Conjunctivae  are normal.  Head: Atraumatic. Nose: No congestion/rhinnorhea. Mouth/Throat: Mucous membranes are moist.   Neck: No stridor. Painless ROM.  Cardiovascular: Normal rate, regular rhythm. Grossly normal heart sounds.  Good peripheral circulation. Respiratory: Normal respiratory effort.  No retractions. Lungs CTAB. Gastrointestinal: Soft and nontender. No distention. No abdominal bruits. No CVA tenderness. Genitourinary:  Musculoskeletal: No lower extremity tenderness nor edema.  No joint effusions. Neurologic:  Normal speech and language. No gross focal neurologic deficits are appreciated. No facial droop Skin:  Skin is warm, dry and intact. No rash noted. Psychiatric: Mood and affect are normal. Speech and behavior are normal.  ____________________________________________   LABS (all labs ordered are listed, but only abnormal results are displayed)  Results for orders placed or performed during the hospital encounter of 12/28/17 (from the past 24 hour(s))  Basic metabolic panel     Status: Abnormal   Collection Time: 12/28/17  8:15 PM  Result Value Ref Range   Sodium 139 135 - 145 mmol/L   Potassium 3.9 3.5 - 5.1 mmol/L   Chloride 107 101 - 111 mmol/L   CO2 26 22 - 32 mmol/L   Glucose, Bld 103 (H) 65 - 99 mg/dL   BUN 22 (H) 6 - 20 mg/dL   Creatinine, Ser 1.01 (H) 0.44 - 1.00 mg/dL   Calcium 9.4 8.9 - 10.3 mg/dL   GFR calc non Af Amer >60 >60 mL/min   GFR calc Af Amer >60 >60 mL/min   Anion gap 6 5 - 15  CBC     Status: Abnormal   Collection Time: 12/28/17  8:15 PM  Result Value Ref Range   WBC 4.6 3.6 - 11.0 K/uL   RBC 3.68 (L) 3.80 - 5.20 MIL/uL   Hemoglobin 12.3 12.0 - 16.0 g/dL   HCT 37.1 35.0 - 47.0 %   MCV 100.7 (H) 80.0 - 100.0 fL   MCH 33.4 26.0 - 34.0 pg   MCHC 33.1 32.0 - 36.0 g/dL   RDW 12.0 11.5 - 14.5 %   Platelets 270 150 - 440 K/uL  Troponin I     Status: None   Collection Time: 12/28/17  8:15 PM  Result Value Ref Range   Troponin I <0.03 <0.03 ng/mL    ____________________________________________  EKG My review and personal interpretation at Time: 20:18   Indication: chest pain  Rate: 60  Rhythm: sinus Axis: normal  Other: normal intervals, no stemi, no preexcitiation syndrome ____________________________________________  RADIOLOGY  I personally reviewed all radiographic images ordered to evaluate for the above acute complaints and reviewed radiology reports and findings.  These findings were personally discussed with the patient.  Please see medical record for radiology report.  ____________________________________________   PROCEDURES  Procedure(s) performed:  Procedures    Critical Care performed: no ____________________________________________   INITIAL IMPRESSION / ASSESSMENT AND PLAN / ED  COURSE  Pertinent labs & imaging results that were available during my care of the patient were reviewed by me and considered in my medical decision making (see chart for details).  DDX: tension, migraine, cluster, sah, acs, dysrhythmia, ptx, pna  Zakyla R Heldt is a 37 y.o. who presents to the ED with symptoms as described above.  Patient afebrile and well-appearing.  Her neuro exam is reassuring and nonfocal.  Not clinically consistent with meningitis subarachnoid mass.  Headache most clinically consistent migraine given her history of the same.  Is not the worst headache of her life.  Patient does not want any IV medications and would prefer oral pain medication.  EKG shows no evidence of ischemia and her troponin is negative.  Chest x-ray also shows no evidence of pneumothorax consolidation or abnormality.  Not clinically consistent with bronchitis.  Patient is low risk by Wells criteria and is PERC negative.  Her heart score is 2.  She is pain-free at this time.  I do believe patient stable and appropriate for outpatient follow-up.      ____________________________________________   FINAL CLINICAL IMPRESSION(S) / ED  DIAGNOSES  Final diagnoses:  Acute nonintractable headache, unspecified headache type  Atypical chest pain      NEW MEDICATIONS STARTED DURING THIS VISIT:  New Prescriptions   No medications on file     Note:  This document was prepared using Dragon voice recognition software and may include unintentional dictation errors.    Merlyn Lot, MD 12/28/17 2235

## 2017-12-28 NOTE — Discharge Instructions (Signed)

## 2017-12-28 NOTE — ED Triage Notes (Signed)
Pt arrives to ED via POV from home with c/o chest pain and headache since 3pm today. Pt reports (+) SHOB; no c/o N/V/D. Pt reports non-radiating, centralized CP; no reported lightheadedness, dizziness, or diaphoresis. Pt is A&O, in NAD; RR even, regular, and unlabored.

## 2018-01-05 ENCOUNTER — Encounter: Payer: Self-pay | Admitting: Medical

## 2018-01-05 ENCOUNTER — Ambulatory Visit: Payer: BLUE CROSS/BLUE SHIELD | Admitting: Medical

## 2018-01-05 VITALS — BP 122/74 | HR 67 | Temp 98.3°F | Ht 63.0 in | Wt 184.0 lb

## 2018-01-05 DIAGNOSIS — R0683 Snoring: Secondary | ICD-10-CM | POA: Diagnosis not present

## 2018-01-05 DIAGNOSIS — R400 Somnolence: Secondary | ICD-10-CM

## 2018-01-05 DIAGNOSIS — R5382 Chronic fatigue, unspecified: Secondary | ICD-10-CM

## 2018-01-05 DIAGNOSIS — R41840 Attention and concentration deficit: Secondary | ICD-10-CM | POA: Diagnosis not present

## 2018-01-05 DIAGNOSIS — F988 Other specified behavioral and emotional disorders with onset usually occurring in childhood and adolescence: Secondary | ICD-10-CM | POA: Insufficient documentation

## 2018-01-05 DIAGNOSIS — E039 Hypothyroidism, unspecified: Secondary | ICD-10-CM

## 2018-01-05 DIAGNOSIS — R6889 Other general symptoms and signs: Secondary | ICD-10-CM | POA: Diagnosis not present

## 2018-01-05 NOTE — Patient Instructions (Signed)
Please call 1 of the following offices to get an appointment to see the psychologist for a psychological evaluation which includes evaluating for attention deficit disorder.  [facility] [address], [place], [place] [address] [phone]   [facility] [phone] [address], [place], [place] [address]   [facility] [phone]  [email or web address] [address], [place], [place],  [address]

## 2018-01-05 NOTE — Progress Notes (Signed)
Subjective: Chief Complaint  Patient presents with  . Medication Management    no concerns   Here for refills and thyroid labs.    Been feeling fatigue for about a month.  Compliant with thyroid medication.   Takes it on empty stomach every morning.  No missed doses lately.  Exercise with walking, most days.    Eating healthy of late.  Sleep - gets about 7- 8 hours per night.  Stays up late doing homework sometimes til 11 pm.  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    In school full time, doing school online.   Working towards associate degree in child care.   Does school work most days of the week.  Doesn't feel rested in the morning.   Gets sleepy in the day.  MGM has sleep apnea  She wonders about ADD.  She notes for years, has felt trouble concentrating, staying focused, easily distracted, takes longer to get tasks done, is forgetful a lot.  although she works full time, in school full time and has kids, tries to have a very consistent routine.  Still feels much difficulty staying on task and being focused.  Son has been diagnosed with ADD.  Denies depressed mood.  No significant worry although she does note mind racing at bedtime.   No SI, HI, no hallucinations.   No other aggravating or relieving factors. No other complaint.  Past Medical History:  Diagnosis Date  . Anemia    Chronic problem for patient.  Followed by Heme, receives IV iron transfusions secondary to inability to tolerate PO supplements.    . Arthritis    bilateral knees  . Headache    Migraines  . Hypothyroidism    post-radiation at age 2  . Shortness of breath dyspnea    Current Outpatient Medications on File Prior to Visit  Medication Sig Dispense Refill  . SYNTHROID 125 MCG tablet TAKE 1 TABLET BY MOUTH ONCE DAILY BEFORE BREAKFAST 90 tablet 0  . levothyroxine (SYNTHROID) 137 MCG tablet Take 1 tablet (137 mcg total) by mouth daily before breakfast. (Patient not taking: Reported on 01/05/2018) 30 tablet 3   No  current facility-administered medications on file prior to visit.     ROS as in subjective    Objective: BP 122/74 (BP Location: Right Arm, Patient Position: Sitting, Cuff Size: Normal)   Pulse 67   Temp 98.3 F (36.8 C) (Oral)   Ht 5\' 3"  (1.6 m)   Wt 184 lb (83.5 kg)   LMP 11/07/2015   SpO2 99%   BMI 32.59 kg/m   BP Readings from Last 3 Encounters:  01/05/18 122/74  12/28/17 131/81  07/12/17 115/77   Wt Readings from Last 3 Encounters:  01/05/18 184 lb (83.5 kg)  12/28/17 178 lb (80.7 kg)  05/30/17 177 lb (80.3 kg)   Gen: wd, wn, nad Psych: pleasant, good eye contact, answers questions appropriately Neck: supple, no lymphadenopathy, no thyromegaly, no masses Heart: RRR, normal S1, S2, no murmurs Lungs: CTA bilaterally, no wheezes, rhonchi, or rales Pulses: 2+ symmetric, upper and lower extremities, normal cap refill No edema    Assessment: Encounter Diagnoses  Name Primary?  . Chronic fatigue Yes  . Hypothyroidism, unspecified type   . Daytime somnolence   . Snoring   . Attention and concentration deficit   . Forgetfulness      Plan Chronic fatigue-we discussed possible causes of her symptoms.  She does have hypothyroidism, and we will update labs today.  I reviewed her recent emergency department notes and labs from where she went for chest pain and headache.  We discussed her concerns about possible attention deficit.  Reviewed PHQ 9, reviewed ADHD questionnaire.  We discussed having a routine that is consistent, work on [employer].  We discussed the possibility of medication to help with attention deficit.  Discussed the risk and benefits of medication.  She declines medication.  She wants further eval.   Gave list of psychologist to establish appt with for formal evaluation.  We discussed the possibility of sleep apnea.  I recommended a sleep study.  She will consider and let me know.   Auria was seen today for medication  management.  Diagnoses and all orders for this visit:  Chronic fatigue -     VITAMIN D 25 Hydroxy (Vit-D Deficiency, Fractures)  Hypothyroidism, unspecified type -     TSH -     T4, free -     VITAMIN D 25 Hydroxy (Vit-D Deficiency, Fractures)  Daytime somnolence  Snoring  Attention and concentration deficit  Forgetfulness

## 2018-01-06 ENCOUNTER — Other Ambulatory Visit: Payer: Self-pay | Admitting: Medical

## 2018-01-06 ENCOUNTER — Telehealth: Payer: Self-pay

## 2018-01-06 LAB — TSH: TSH: 1.58 u[IU]/mL (ref 0.450–4.500)

## 2018-01-06 LAB — T4, FREE: Free T4: 1.67 ng/dL (ref 0.82–1.77)

## 2018-01-06 LAB — VITAMIN D 25 HYDROXY (VIT D DEFICIENCY, FRACTURES): Vit D, 25-Hydroxy: 21.7 ng/mL — ABNORMAL LOW (ref 30.0–100.0)

## 2018-01-06 MED ORDER — SYNTHROID 125 MCG PO TABS
ORAL_TABLET | ORAL | 1 refills | Status: DC
Start: 2018-01-06 — End: 2018-08-31

## 2018-01-06 MED ORDER — VITAMIN D 1000 UNITS PO TABS: 1000.0000 [IU] | ORAL_TABLET | Freq: Every day | ORAL | 3 refills | Status: DC

## 2018-01-06 NOTE — Telephone Encounter (Signed)
Called, LVM advising patient to call back regarding labs. Left call back number.

## 2018-01-28 ENCOUNTER — Other Ambulatory Visit: Payer: Self-pay

## 2018-01-28 ENCOUNTER — Emergency Department (HOSPITAL_COMMUNITY)
Admission: EM | Admit: 2018-01-28 | Discharge: 2018-01-28 | Disposition: A | Discharge status: Home / Self Care | Payer: BLUE CROSS/BLUE SHIELD | Attending: Emergency Medicine | Admitting: Emergency Medicine

## 2018-01-28 ENCOUNTER — Emergency Department (HOSPITAL_COMMUNITY): Payer: BLUE CROSS/BLUE SHIELD

## 2018-01-28 ENCOUNTER — Encounter (HOSPITAL_COMMUNITY): Payer: Self-pay | Admitting: Emergency Medicine

## 2018-01-28 DIAGNOSIS — Z79899 Other long term (current) drug therapy: Secondary | ICD-10-CM | POA: Insufficient documentation

## 2018-01-28 DIAGNOSIS — R52 Pain, unspecified: Secondary | ICD-10-CM

## 2018-01-28 DIAGNOSIS — E039 Hypothyroidism, unspecified: Secondary | ICD-10-CM | POA: Diagnosis not present

## 2018-01-28 DIAGNOSIS — M25532 Pain in left wrist: Secondary | ICD-10-CM | POA: Diagnosis not present

## 2018-01-28 MED ORDER — MELOXICAM 7.5 MG PO TABS: 7.5000 mg | ORAL_TABLET | Freq: Every day | ORAL | 0 refills | Status: DC

## 2018-01-28 NOTE — ED Triage Notes (Signed)
Pt. Stated, Im having rt. Wrist pain for over a year. Worse in there last few days,.

## 2018-01-28 NOTE — Discharge Instructions (Signed)
I have given you a prescription for Mobic (meloxicam) today.  Mobic is a NSAID medication and you should not take it with other NSAIDs.  Examples of other NSAIDS include motrin, ibuprofen, aleve, naproxen, and Voltaren.  Please monitor your bowel movements for dark, tarry, sticky stools. If you have any bowel movements like this you need to stop taking mobic and call your doctor as this may represent a stomach ulcer from taking NSAIDS.    Please take Tylenol (acetaminophen) to relieve your pain.  You may take tylenol, up to 1,000 mg (two extra strength pills).  Do not take more than 3,000 mg tylenol in a 24 hour period.  Please check all medication labels as many medications such as pain and cold medications may contain tylenol. Please do not drink alcohol while taking this medication.   If you develop excruciating pain, are unable to move your wrist, your fingers or hand becomes cold, pale, you have true numbness meaning that you cannot feel at all, or you have any additional concerns please seek additional medical evaluation.  I have given you follow-up with hand surgery.  Please do not wear any jewelry on your fingers while you are having this wrist pain as even small amounts of swelling in the fingers or wrist can cause rings to cut off circulation to your fingers requiring the rings to be cut and removed.

## 2018-01-28 NOTE — ED Notes (Signed)
Pt states pain started a few weeks ago, but was dull and did not hinder ADL's. Pt stated pain become more severe around 2 a.m. this morning and she is not able to use her hand. Pt has full range of motion in wrists bilaterally, but expresses pain when doing so. Radial pulses and capillary refill equal bilaterally.

## 2018-01-28 NOTE — ED Provider Notes (Signed)
[facility] EMERGENCY DEPARTMENT Provider Note   CSN: [id number] Arrival date & time: 01/28/18  2025     History   Chief Complaint Chief Complaint  Patient presents with  . Wrist Pain    HPI Miranda Fowler is a 37 y.o. female with a history of left wrist pain, anemia, who presents today for evaluation of left-sided wrist pain.  She reports that for the past few weeks it has been aching, however her pain became more severe at around 2 AM this morning.  She denies any numbness or tingling.  She does report that she has had pain in her wrist in the past, followed up with orthopedics and was given steroids.  She denies any injury or fall.  No recent fevers or chills.  She denies pain in any other joints.  HPI  Past Medical History:  Diagnosis Date  . Anemia    Chronic problem for patient.  Followed by Heme, receives IV iron transfusions secondary to inability to tolerate PO supplements.    . Arthritis    bilateral knees  . Headache    Migraines  . Hypothyroidism    post-radiation at age 9  . Shortness of breath dyspnea     Patient Active Problem List   Diagnosis Date Noted  . Attention and concentration deficit 01/05/2018  . Forgetfulness 01/05/2018  . Hyperglycemia 10/28/2016  . Routine general medical examination at a health care facility 10/28/2016  . Daytime somnolence 07/08/2016  . Snoring 07/08/2016  . Chronic fatigue 07/08/2016  . Knee pain, bilateral 07/08/2016  . Influenza vaccination declined 07/08/2016  . Postoperative state 04/06/2016  . History of transfusion 02/09/2016  . Foot swelling 01/08/2016  . Shortness of breath   . Anemia 11/07/2015  . Right shoulder pain 04/01/2014  . Screen for STD (sexually transmitted disease) 11/05/2013  . Hypothyroidism 04/06/2010  . Iron deficiency anemia 04/06/2010    Past Surgical History:  Procedure Laterality Date  . BILATERAL SALPINGECTOMY Bilateral 2008   pt denies  . CESAREAN SECTION     . CYSTOSCOPY N/A 04/06/2016   Procedure: CYSTOSCOPY;  Surgeon: Terrance Mass, MD;  Location: [facility];  Service: Gynecology;  Laterality: N/A;  . LAPAROSCOPIC ASSISTED VAGINAL HYSTERECTOMY N/A 04/06/2016   Procedure: LAPAROSCOPIC ASSISTED VAGINAL HYSTERECTOMY,  Bilateral Salpingectomy;  Surgeon: Terrance Mass, MD;  Location: [facility];  Service: Gynecology;  Laterality: N/A;  . TUBAL LIGATION       OB History    Gravida  4   Para  2   Term  0   Preterm  2   AB  0   Living  4     SAB  0   TAB  0   Ectopic  0   Multiple  0   Live Births               Home Medications    Prior to Admission medications   Medication Sig Start Date End Date Taking? Authorizing Provider  cholecalciferol (VITAMIN D) 1000 units tablet Take 1 tablet (1,000 Units total) by mouth daily. 01/06/18   Tysinger, Camelia Eng, PA-C  meloxicam (MOBIC) 7.5 MG tablet Take 1 tablet (7.5 mg total) by mouth daily. 01/28/18   Lorin Glass, PA-C  SYNTHROID 125 MCG tablet TAKE 1 TABLET BY MOUTH ONCE DAILY BEFORE BREAKFAST 01/06/18   Tysinger, Camelia Eng, PA-C    Family History Family History  Problem Relation Age of Onset  . Diabetes Maternal Grandmother   .  Cancer Paternal Aunt        breast  . Heart disease Paternal Grandmother   . Anemia Mother   . Lupus Mother   . Sickle cell trait Mother   . Hypertension Father   . Sickle cell trait Sister   . Diabetes Maternal Grandfather   . Cancer Paternal Aunt        cervical    Social History Social History   Tobacco Use  . Smoking status: Never Smoker  . Smokeless tobacco: Never Used  Substance Use Topics  . Alcohol use: No    Alcohol/week: 0.0 oz    Comment: wine  . Drug use: No     Allergies   Patient has no known allergies.   Review of Systems Review of Systems  Constitutional: Negative for chills and fever.  Musculoskeletal: Negative for myalgias.  Skin: Negative for wound.     Physical Exam Updated Vital Signs BP 102/70  (BP Location: Right Arm)   Pulse 63   Temp 97.9 F (36.6 C) (Oral)   Resp 16   Ht 5\' 2"  (1.575 m)   Wt 82.6 kg (182 lb)   LMP 11/07/2015   SpO2 100%   BMI 33.29 kg/m   Physical Exam  Constitutional: She appears well-developed and well-nourished.  HENT:  Head: Normocephalic and atraumatic.  Cardiovascular: Intact distal pulses.  2+ left radial pulse.  Left hand and fingers are warm and well perfused.  Musculoskeletal:  Tenderness to palpation over the dorsal aspect of the left wrist.  There is no obvious crepitus, deformities, erythema, induration or bruising.  There is a negative Tinel sign over the carpal tunnel.  No obvious thenar atrophy.  No tenderness over the anatomic snuffbox.  No pain over the thumb.    Neurological: She is alert.  Sensation intact to left hand and fingers.  Skin: Skin is warm and dry. She is not diaphoretic.  Nursing note and vitals reviewed.    ED Treatments / Results  Labs (all labs ordered are listed, but only abnormal results are displayed) Labs Reviewed - No data to display  EKG None  Radiology Dg Wrist Complete Left  Result Date: 01/28/2018 CLINICAL DATA:  37 year old female with a history of wrist pain EXAM: LEFT WRIST - COMPLETE 3+ VIEW COMPARISON:  None. FINDINGS: There is no evidence of fracture or dislocation. There is no evidence of arthropathy or other focal bone abnormality. Soft tissues are unremarkable. IMPRESSION: Negative. Electronically Signed   By: Corrie Mckusick D.O.   On: 01/28/2018 10:45    Procedures Procedures (including critical care time)  Medications Ordered in ED Medications - No data to display   Initial Impression / Assessment and Plan / ED Course  I have reviewed the triage vital signs and the nursing notes.  Pertinent labs & imaging results that were available during my care of the patient were reviewed by me and considered in my medical decision making (see chart for details).    Patient presents today  for evaluation of left wrist pain.  Symptoms and physical are not consistent with carpal tunnel or de Quervain's tenosynovitis.  Based on the time course and patient's physical exam I am not concerned for gout or septic arthritis.  X-rays were obtained without obvious abnormalities.  No history of trauma.  She request something stronger than ibuprofen and Tylenol for her pain.  I do not feel like narcotic pain medicine is indicated at this time.  She will be given a prescription for  Mobic as she does not have any history of stomach ulcers or GI bleeding.  Patient given follow-up with hand.  Return precautions discussed and she states her understanding.  Final Clinical Impressions(s) / ED Diagnoses   Final diagnoses:  Left wrist pain    ED Discharge Orders        Ordered    meloxicam (MOBIC) 7.5 MG tablet  Daily     01/28/18 1233       Lorin Glass, [place] 01/28/18 1244    Fredia Sorrow, MD 01/29/18 [phone]

## 2018-01-28 NOTE — ED Notes (Signed)
Patient able to ambulate independently  

## 2018-03-16 ENCOUNTER — Ambulatory Visit (INDEPENDENT_AMBULATORY_CARE_PROVIDER_SITE_OTHER): Payer: BLUE CROSS/BLUE SHIELD | Admitting: Psychology

## 2018-03-16 DIAGNOSIS — F422 Mixed obsessional thoughts and acts: Secondary | ICD-10-CM | POA: Diagnosis not present

## 2018-03-16 DIAGNOSIS — F341 Dysthymic disorder: Secondary | ICD-10-CM | POA: Diagnosis not present

## 2018-03-16 DIAGNOSIS — F902 Attention-deficit hyperactivity disorder, combined type: Secondary | ICD-10-CM | POA: Diagnosis not present

## 2018-03-16 DIAGNOSIS — F411 Generalized anxiety disorder: Secondary | ICD-10-CM

## 2018-03-22 ENCOUNTER — Ambulatory Visit: Payer: BLUE CROSS/BLUE SHIELD | Admitting: Medical

## 2018-03-22 ENCOUNTER — Telehealth: Payer: Self-pay | Admitting: Medical

## 2018-03-22 ENCOUNTER — Encounter: Payer: Self-pay | Admitting: Medical

## 2018-03-22 VITALS — BP 112/68 | HR 68 | Resp 16 | Ht 62.0 in | Wt 182.8 lb

## 2018-03-22 DIAGNOSIS — F419 Anxiety disorder, unspecified: Secondary | ICD-10-CM | POA: Diagnosis not present

## 2018-03-22 DIAGNOSIS — R41840 Attention and concentration deficit: Secondary | ICD-10-CM

## 2018-03-22 DIAGNOSIS — R6889 Other general symptoms and signs: Secondary | ICD-10-CM | POA: Diagnosis not present

## 2018-03-22 MED ORDER — AMPHETAMINE-DEXTROAMPHETAMINE 10 MG PO TABS: 10.0000 mg | ORAL_TABLET | Freq: Two times a day (BID) | ORAL | 0 refills | Status: DC

## 2018-03-22 NOTE — Progress Notes (Signed)
Subjective: Chief Complaint  Patient presents with  . Follow-up    from behavioral health assessment. No other concerns.    Here for recheck on problems with focus, trouble concentrating.  I saw her in 12/2017 for same.    She just recently saw psychology at [facility] mental [facility].  They reportedly faxed over their notes to me.   She notes they listed her a diagnosis of ADHD, low IQ around 72, stress, and other.   They advised medication to help with focus and to establish counseling.     From last visit she reported fatigue, trouble concentrating, staying focused, easily distracted, takes longer to get tasks done, is forgetful a lot.  although she works full time, in school full time and has kids, tries to have a very consistent routine.  She does report issues with having to retake some of her college classes due to failing in the pass.  Still feels much difficulty staying on task and being focused.  Son has been diagnosed with ADD.  Denies depressed mood.  No significant worry although she does note mind racing at bedtime.   No SI, HI, no hallucinations  Compliant with thyroid medication.   Takes it on empty stomach every morning.  No missed doses lately.  Exercise with walking, most days.    Eating healthy of late.  Sleep - gets about 7- 8 hours per night.  Stays up late doing homework sometimes til 11 pm.  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    In school full time, doing school online.   Working towards associate degree in child care.   Does school work most days of the week.  Doesn't feel rested in the morning.   Gets sleepy in the day.  MGM has sleep apnea  No other aggravating or relieving factors. No other complaint.  Past Medical History:  Diagnosis Date  . Anemia    Chronic problem for patient.  Followed by Heme, receives IV iron transfusions secondary to inability to tolerate PO supplements.    . Arthritis    bilateral knees  . Headache    Migraines  . Hypothyroidism    post-radiation at age 57  . Shortness of breath dyspnea    Current Outpatient Medications on File Prior to Visit  Medication Sig Dispense Refill  . cholecalciferol (VITAMIN D) 1000 units tablet Take 1 tablet (1,000 Units total) by mouth daily. 90 tablet 3  . SYNTHROID 125 MCG tablet TAKE 1 TABLET BY MOUTH ONCE DAILY BEFORE BREAKFAST 90 tablet 1  . meloxicam (MOBIC) 7.5 MG tablet Take 1 tablet (7.5 mg total) by mouth daily. (Patient not taking: Reported on 03/22/2018) 14 tablet 0   No current facility-administered medications on file prior to visit.     ROS as in subjective    Objective: BP 112/68   Pulse 68   Resp 16   Ht 5\' 2"  (1.575 m)   Wt 182 lb 12.8 oz (82.9 kg)   LMP 11/07/2015   BMI 33.43 kg/m   BP Readings from Last 3 Encounters:  03/22/18 112/68  01/28/18 113/83  01/05/18 122/74   Wt Readings from Last 3 Encounters:  03/22/18 182 lb 12.8 oz (82.9 kg)  01/28/18 182 lb (82.6 kg)  01/05/18 184 lb (83.5 kg)   Gen: wd, wn, nad Psych: pleasant, good eye contact, answers questions appropriately     Assessment: Encounter Diagnoses  Name Primary?  . Attention and concentration deficit Yes  . Forgetfulness   .  Anxiety      Plan We discussed her concerns about attention deficit.  Reviewed PHQ 9, reviewed ADHD questionnaire from last visit.  We discussed having a routine that is consistent, work on [employer].  Begin trial of medication below.  Discussed risks, benefits of medication, medication safety, safeguarding her medication being a controlled substance.     Denys was seen today for follow-up.  Diagnoses and all orders for this visit:  Attention and concentration deficit  Forgetfulness  Anxiety  Other orders -     amphetamine-dextroamphetamine (ADDERALL) 10 MG tablet; Take 1 tablet (10 mg total) by mouth 2 (two) times daily.

## 2018-03-22 NOTE — Telephone Encounter (Signed)
I received a message through staff message from Dr. Lurline Hare, psychology.    Below is the documentation:   Per Dr. Lurline Hare... Met with patient for testing session. Patient presents with history of attention deficits and learning problems since childhood. Some depressed mood, anxiety, and obsessive thought also noted. Testing recommended to evaluate for ADHD and other contributing factors. Tests included the K-BIT-2, CNS Vital Signs, BRIEF-A, Adult ADHD Self Rating Scale, Depression, Anxiety, and Stress Scale and Adult OCD Inventory. Results indicated low average overall intellectual functioning with average visual reasoning but low verbal comprehension (4th percentile), which led to patient taking longer than expected to understand verbal directions. Ratings for executive function indicated significantly elevated behavior and thought regulation. Testing for neurocognitive function indicated deficits in attention and cognitive processing well below general intellectual ability. Ratings for emotional functioning indicated significant levels of anxiety, depressed mood, and obsessive-compulsive traits. Patients meets diagnostic criterion for ADHD-combined presentation along with Generalized anxiety disorder, persistent depressive disorder, and Obsessive Compulsive disorder. Testing complete. Results discussed with patient and to be forwarded to PCP. Report to follow.     Doniphan. Altabet, Ph.D.

## 2018-04-03 ENCOUNTER — Telehealth: Payer: Self-pay | Admitting: Medical

## 2018-04-03 NOTE — Telephone Encounter (Signed)
Received requested info from [facility]. Sending back for review.

## 2018-08-30 ENCOUNTER — Ambulatory Visit: Payer: BLUE CROSS/BLUE SHIELD | Admitting: Medical

## 2018-08-30 ENCOUNTER — Other Ambulatory Visit: Payer: Self-pay

## 2018-08-30 ENCOUNTER — Encounter: Payer: Self-pay | Admitting: Medical

## 2018-08-30 VITALS — BP 124/76 | HR 58 | Temp 98.2°F | Resp 16 | Ht 63.0 in | Wt 181.6 lb

## 2018-08-30 DIAGNOSIS — D649 Anemia, unspecified: Secondary | ICD-10-CM

## 2018-08-30 DIAGNOSIS — J988 Other specified respiratory disorders: Secondary | ICD-10-CM | POA: Diagnosis not present

## 2018-08-30 DIAGNOSIS — J0110 Acute frontal sinusitis, unspecified: Secondary | ICD-10-CM | POA: Diagnosis not present

## 2018-08-30 DIAGNOSIS — R0602 Shortness of breath: Secondary | ICD-10-CM

## 2018-08-30 DIAGNOSIS — R05 Cough: Secondary | ICD-10-CM | POA: Diagnosis not present

## 2018-08-30 DIAGNOSIS — R931 Abnormal findings on diagnostic imaging of heart and coronary circulation: Secondary | ICD-10-CM | POA: Insufficient documentation

## 2018-08-30 DIAGNOSIS — E039 Hypothyroidism, unspecified: Secondary | ICD-10-CM

## 2018-08-30 DIAGNOSIS — R059 Cough, unspecified: Secondary | ICD-10-CM

## 2018-08-30 MED ORDER — FLUTICASONE PROPIONATE 50 MCG/ACT NA SUSP
2.0000 | Freq: Every day | NASAL | 6 refills | Status: DC
Start: 2018-08-30 — End: 2020-12-03

## 2018-08-30 MED ORDER — AMOXICILLIN 875 MG PO TABS: 875.0000 mg | ORAL_TABLET | Freq: Two times a day (BID) | ORAL | 0 refills | Status: DC

## 2018-08-30 NOTE — Patient Instructions (Signed)
Recommendations:  Begin amoxicillin antibiotic for your current sinus and respiratory symptoms, hydrate well with water, begin Flonase nasal spray to help with the intranasal swelling and congestion, you can use nasal saline flushes salt water gargles as well.  If you are not much improved by the weekend and call or return  We will check some labs today to update labs for thyroid and anemia screening  I am referring you back to cardiology given the abnormal ultrasound of your heart back in 2017 as well as your shortness of breath symptoms.  Your breathing test today was normal.   Using Saline Nose Drops with Bulb Syringe A bulb syringe is used to clear your nose. You may use it when you have a stuffy nose, nasal congestion, sinus pressure, or sneezing.   SALINE SOLUTION You can buy nose drops at your local drug store. You can also make nose drops yourself. Mix 1 cup of water with  teaspoon of salt. Stir. Store this mixture at room temperature. Make a new batch daily.  USE THE BULB IN COMBINATION WITH SALINE NOSE DROPS  Squeeze the air out of the bulb before suctioning the saline mixture.  While still squeezing the bulb flat, place the tip of the bulb into the saline mixture.  Let air come back into the bulb.  This will suction up the saline mixture.  Gently flush one nostril at a time.  Salt water nose drops will then moisten your  congested nose and loosen secretions before suctioning.  Use the bulb syringe as directed below to suction.  USING THE BULB SYRINGE TO SUCTION  While still squeezing the bulb flat, place the tip of the bulb into a nostril. Let air come back into the bulb. The suction will pull snot out of the nose and into the bulb.  Repeat on the other nostril.  Squeeze syringe several times into a tissue.  CLEANING THE BULB SYRINGE  Clean the bulb syringe every day with hot soapy water.  Clean the inside of the bulb by squeezing the bulb while the tip is in  soapy water.  Rinse by squeezing the bulb while the tip is in clean hot water.  Store the bulb with the tip side down on paper towel.  HOME CARE INSTRUCTIONS   Use saline nose drops often to keep the nose open and not stuffy.  Throw away used salt water. Make a new solution every time.  Do not use the same solution and dropper for another person  If you do not prefer to use nasal saline flush, other options include nasal saline spray or the [employer], both of which are available over the counter at your pharmacy.

## 2018-08-30 NOTE — Progress Notes (Signed)
Subjective: Chief Complaint  Patient presents with  . sinus    sinus pressure, congestion, fatigue X 3-4 weeks   Here for congestion, fatigue, head congestion, nasal drainage on and off for a few weeks, sinus pressure just started last few days.  Has some cough.  No fever, no NVD, no wheezing, but feels a little SOB going up stairs with these symptoms.  No sore throat.   No ear pain.  No sneezing.   No watery eyes.   No body aches, no chills.  daughter had cold recently.   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   Using alka seltzer cold and sinus plus.  In general gets SOB here and there.  No other aggravating or relieving factors. No other complaint.   Past Medical History:  Diagnosis Date  . Anemia    Chronic problem for patient.  Followed by Heme, receives IV iron transfusions secondary to inability to tolerate PO supplements.    . Arthritis    bilateral knees  . Headache    Migraines  . Hypothyroidism    post-radiation at age 7  . Shortness of breath dyspnea    Current Outpatient Medications on File Prior to Visit  Medication Sig Dispense Refill  . amphetamine-dextroamphetamine (ADDERALL) 10 MG tablet Take 1 tablet (10 mg total) by mouth 2 (two) times daily. 60 tablet 0  . SYNTHROID 125 MCG tablet TAKE 1 TABLET BY MOUTH ONCE DAILY BEFORE BREAKFAST 90 tablet 1  . cholecalciferol (VITAMIN D) 1000 units tablet Take 1 tablet (1,000 Units total) by mouth daily. (Patient not taking: Reported on 08/30/2018) 90 tablet 3  . meloxicam (MOBIC) 7.5 MG tablet Take 1 tablet (7.5 mg total) by mouth daily. (Patient not taking: Reported on 03/22/2018) 14 tablet 0   No current facility-administered medications on file prior to visit.    ROS as in subjective   Objective: BP 124/76   Pulse (!) 58   Temp 98.2 F (36.8 C) (Oral)   Resp 16   Ht 5\' 3"  (1.6 m)   Wt 181 lb 9.6 oz (82.4 kg)   LMP 11/07/2015   SpO2 98%   BMI 32.17 kg/m   General appearance: Alert, WD/WN, no distress, mildly ill  appearing                             Skin: warm, no rash, no diaphoresis                           Head: +sinus tenderness                            Eyes: conjunctiva normal, corneas clear, PERRLA                            Ears: pearly TMs, external ear canals normal                          Nose: septum midline, turbinates swollen, with erythema and clear discharge             Mouth/throat: MMM, tongue normal, mild pharyngeal erythema                           Neck: supple, no adenopathy, no  thyromegaly, non tender                          Heart: RRR, normal S1, S2, no murmurs                         Lungs: +bronchial breath sounds, no rhonchi, no wheezes, no rales                Extremities: no edema, non tender       Assessment: Encounter Diagnoses  Name Primary?  . Acute non-recurrent frontal sinusitis Yes  . Respiratory tract infection   . Cough   . SOB (shortness of breath)   . Abnormal echocardiogram   . Hypothyroidism, unspecified type   . Anemia, unspecified type      Plan: We discussed her acute and chronic symptoms  Begin antibiotic below for sinus infection, Flonase, rest, hydrate well, and if acute symptoms do not improve within the next 4 to 5 days recheck  She reports shortness of breath, fatigue, and in the past we had brought up the possibility of doing a sleep study which she has not done yet.  PFT reviewed today which was normal.  She had an abnormal echocardiogram in 2017.  Not sure that she had a follow-up on this.  We will refer back to cardiology  Given the symptoms today, and given her thyroid condition, labs as below  She sometimes forgets to take her thyroid medication.  Discussed compliance  Cassity was seen today for sinus.  Diagnoses and all orders for this visit:  Acute non-recurrent frontal sinusitis  Respiratory tract infection  Cough -     Spirometry with Graph  SOB (shortness of breath) -     Spirometry with Graph -     TSH -      Basic metabolic panel -     CBC -     Ambulatory referral to Cardiology -     Pulse oximetry (single); Future  Abnormal echocardiogram -     Ambulatory referral to Cardiology -     Pulse oximetry (single); Future  Hypothyroidism, unspecified type -     TSH  Anemia, unspecified type -     CBC  Other orders -     amoxicillin (AMOXIL) 875 MG tablet; Take 1 tablet (875 mg total) by mouth 2 (two) times daily. -     fluticasone (FLONASE) 50 MCG/ACT nasal spray; Place 2 sprays into both nostrils daily.

## 2018-08-31 ENCOUNTER — Other Ambulatory Visit: Payer: Self-pay | Admitting: Medical

## 2018-08-31 LAB — BASIC METABOLIC PANEL
BUN/Creatinine Ratio: 15 (ref 9–23)
BUN: 15 mg/dL (ref 6–20)
CHLORIDE: 102 mmol/L (ref 96–106)
CO2: 24 mmol/L (ref 20–29)
Calcium: 9.3 mg/dL (ref 8.7–10.2)
Creatinine, Ser: 1 mg/dL (ref 0.57–1.00)
GFR calc non Af Amer: 72 mL/min/{1.73_m2} (ref >59)
GFR, EST AFRICAN AMERICAN: 83 mL/min/{1.73_m2} (ref >59)
Glucose: 79 mg/dL (ref 65–99)
POTASSIUM: 4.2 mmol/L (ref 3.5–5.2)
Sodium: 139 mmol/L (ref 134–144)

## 2018-08-31 LAB — CBC
HEMATOCRIT: 35.7 % (ref 34.0–46.6)
Hemoglobin: 12 g/dL (ref 11.1–15.9)
MCH: 33.2 pg — ABNORMAL HIGH (ref 26.6–33.0)
MCHC: 33.6 g/dL (ref 31.5–35.7)
MCV: 99 fL — ABNORMAL HIGH (ref 79–97)
Platelets: 228 10*3/uL (ref 150–450)
RBC: 3.61 x10E6/uL — ABNORMAL LOW (ref 3.77–5.28)
RDW: 11.4 % — ABNORMAL LOW (ref 12.3–15.4)
WBC: 4.6 10*3/uL (ref 3.4–10.8)

## 2018-08-31 LAB — TSH: TSH: 49.66 u[IU]/mL — AB (ref 0.450–4.500)

## 2018-08-31 MED ORDER — SYNTHROID 125 MCG PO TABS
ORAL_TABLET | ORAL | 1 refills | Status: DC
Start: 2018-08-31 — End: 2021-01-01

## 2018-09-05 ENCOUNTER — Telehealth: Payer: Self-pay | Admitting: Medical

## 2018-09-05 NOTE — Telephone Encounter (Signed)
  Please call Pt states she was seen last week for numbness and she is now noticing bruising and has questions

## 2018-09-06 NOTE — Telephone Encounter (Signed)
Pt called back for info from [place].  Lazaro Arms out of office.  Offered pt an appointment since no improvement.  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  Also offered her a Friday appt with Audelia Acton, she said couldn't come. She accepted an appt with Dr. Tomi Bamberger for last appt of day Thursday.

## 2018-09-07 ENCOUNTER — Encounter: Payer: Self-pay | Admitting: Family Medicine

## 2018-09-07 ENCOUNTER — Ambulatory Visit: Payer: BLUE CROSS/BLUE SHIELD | Admitting: Family Medicine

## 2018-09-07 VITALS — BP 110/70 | HR 76 | Temp 98.3°F | Ht 63.0 in | Wt 183.6 lb

## 2018-09-07 DIAGNOSIS — J988 Other specified respiratory disorders: Secondary | ICD-10-CM | POA: Diagnosis not present

## 2018-09-07 DIAGNOSIS — E039 Hypothyroidism, unspecified: Secondary | ICD-10-CM

## 2018-09-07 DIAGNOSIS — T148XXA Other injury of unspecified body region, initial encounter: Secondary | ICD-10-CM | POA: Diagnosis not present

## 2018-09-07 DIAGNOSIS — R202 Paresthesia of skin: Secondary | ICD-10-CM

## 2018-09-07 NOTE — Patient Instructions (Signed)
  Your sinus infection is getting better, complete the full antibiotic course. Your bruising on the leg seems to have been from a probably trauma, and it seems to be resolving.  I don't suspect any serious underlying etiology.  We discussed some causes of tingling--you don't seem to have problems in your back, some may be positioning.  If you develop any weakness or pain in the leg, you may need further evaluation. Doesn't appear that there is anything wrong with the back causing it.  Continue to take your thyroid medication every single day, and return for a recheck in about 1-2 months per Audelia Acton.  If your TSH is still elevated on recheck, your dose will need to be adjusted.

## 2018-09-07 NOTE — Progress Notes (Signed)
Chief Complaint  Patient presents with  . Bleeding/Bruising    this past weekend she noticed it on her lower left leg. Very tender to touch. That same leg goes numb when she leaves it still for a short amount of time.   . Sinusitis    follow up from visit on 08/30/18-still having discolored mucus.   . Flu Vaccine    declined.     11/16 or 17 she noticed dark, large bruises on the left lower leg, one towards the top of the lower leg, and one toward the medial ankle (lower portion).  She later noticed a bruise between these areas.  All three bruises were sore to touch. Since then they have gotten lighter, less tender. No known trauma or injury to the left leg. No known bruising or bleeding elsewhere on body. No recent NSAID use. 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  She had some tingling in the left foot and leg--not currently.  It can occur if she lifts her leg, with position changes, short-lived, and feeling is back to normal once she changes position again. Sometimes she needs to massage the area. She denies any back pain, weakness.  She also describes that when she wakes up in the morning, she has cramp in her left calf, a pulling sensation She has never been able to stretch in the morning as it causes cramping.  She saw Audelia Acton recently with upper respiratory illness.  She has been on Amox since 11/13.  It has gotten a lot better.  Her energy is better, no longer feels drained.  Sinus pressure is better. Mucus is clear during the day, still sees some green, but only in the mornings.  Hypothyroidism--Last TSH was very high, admitted to missing some pills. Shane didn't adjust the dose, told to not miss further doses, and take earlier, on empty stomach.  Hasn't yet scheduled f/u lab.  Previously TSH was normal on the same dose.  PMH, PSH, SH reviewed.  Outpatient Encounter Medications as of 09/07/2018  Medication Sig  . amoxicillin (AMOXIL) 875 MG tablet Take 1 tablet (875 mg total) by  mouth 2 (two) times daily.  . cholecalciferol (VITAMIN D) 1000 units tablet Take 1 tablet (1,000 Units total) by mouth daily.  . fluticasone (FLONASE) 50 MCG/ACT nasal spray Place 2 sprays into both nostrils daily.  Marland Kitchen SYNTHROID 125 MCG tablet TAKE 1 TABLET BY MOUTH ONCE DAILY BEFORE BREAKFAST  . amphetamine-dextroamphetamine (ADDERALL) 10 MG tablet Take 1 tablet (10 mg total) by mouth 2 (two) times daily. (Patient not taking: Reported on 09/07/2018)   No facility-administered encounter medications on file as of 09/07/2018.    Has meloxicam to use prn for carpal tunnel, not used in a long time.  No Known Allergies   ROS: no fever, chills.  Headache/sinus pain improved.  +residual discolored drainage per HPI, just in the morning. Cough is improving.  No nose bleeds. Bruising on leg only, no other bleeding. No rashes. Leg cramps, intermittent tingling a per HPI.  No weakness, back pain, other neuro symptoms.  See HPI.  PHYSICAL EXAM:  BP 110/70   Pulse 76   Temp 98.3 F (36.8 C) (Tympanic)   Ht 5\' 3"  (1.6 m)   Wt 183 lb 9.6 oz (83.3 kg)   LMP 11/07/2015   BMI 32.52 kg/m   Very pleasant, well-appearing female in no distress HEENT: PERRL, EOMi, conjunctiva and sclera are clear. OP is clear, sinuses are nontender Neck: no lymphadenopathy or mass Heart:  regular rate and rhythm Lungs: clear bilaterally Back: no spinal or CVA tenderness, no muscle spasm Extremities: Slight residual bruising x 3 areas of the left lower medial leg Not really tender. No induration or soft tissue swelling,  Negative Homan, no cords No edema 2+ pulses Normal sensation   Recent labs reviewed  Lab Results  Component Value Date   WBC 4.6 08/30/2018   HGB 12.0 08/30/2018   HCT 35.7 08/30/2018   MCV 99 (H) 08/30/2018   PLT 228 08/30/2018   Lab Results  Component Value Date   TSH 49.660 (H) 08/30/2018     ASSESSMENT/PLAN:  Bruising - suspect she had trauma to leg (?related to work), and is  healing appropriately. Reassured. CBC recently normal  Paresthesias - intermittent, positional. Normal neuro exam. Discussed DDX  Hypothyroidism, unspecified type - suboptimally replaced due to noncompliance. Reminded to schedule recheck, sx related to this discussed  Respiratory tract infection - improving with ABX, to complete the course  25 min visit, more than 1/2 spent counseling, due to her having so many various complaints, concerns, worries.     Your sinus infection is getting better, complete the full antibiotic course. Your bruising on the leg seems to have been from a probably trauma, and it seems to be resolving.  I don't suspect any serious underlying etiology.  We discussed some causes of tingling--you don't seem to have problems in your back, some may be positioning.  If you develop any weakness or pain in the leg, you may need further evaluation. Doesn't appear that there is anything wrong with the back causing it.  Continue to take your thyroid medication every single day, and return for a recheck in about 1-2 months per Audelia Acton.  If your TSH is still elevated on recheck, your dose will need to be adjusted.

## 2018-09-13 DIAGNOSIS — Z0189 Encounter for other specified special examinations: Secondary | ICD-10-CM | POA: Diagnosis not present

## 2018-09-13 DIAGNOSIS — R0602 Shortness of breath: Secondary | ICD-10-CM | POA: Diagnosis not present

## 2018-09-27 DIAGNOSIS — R0602 Shortness of breath: Secondary | ICD-10-CM | POA: Diagnosis not present

## 2018-10-09 ENCOUNTER — Ambulatory Visit: Payer: BLUE CROSS/BLUE SHIELD | Admitting: Medical

## 2018-10-09 ENCOUNTER — Encounter: Payer: Self-pay | Admitting: Medical

## 2018-10-09 VITALS — BP 108/68 | HR 75 | Temp 98.0°F | Wt 178.0 lb

## 2018-10-09 DIAGNOSIS — R0982 Postnasal drip: Secondary | ICD-10-CM | POA: Diagnosis not present

## 2018-10-09 DIAGNOSIS — R05 Cough: Secondary | ICD-10-CM

## 2018-10-09 DIAGNOSIS — R059 Cough, unspecified: Secondary | ICD-10-CM

## 2018-10-09 MED ORDER — HYDROCOD POLST-CPM POLST ER 10-8 MG/5ML PO SUER: 5.0000 mL | Freq: Two times a day (BID) | ORAL | 0 refills | Status: DC | PRN

## 2018-10-09 NOTE — Addendum Note (Signed)
Addended by: Carlena Hurl on: 10/09/2018 06:28 PM   Modules accepted: Orders

## 2018-10-09 NOTE — Progress Notes (Signed)
Subjective: Chief Complaint  Patient presents with  . other     cough  for the past three weeks   Here for runny nose, stuffy, coughing a lot x 3 weeks, fatigue.  Using alka selter cold plus, cough drops.  Has had sick contacts.  Works in day care.  Was on antibiotic weeks ago.  Cough lingers, worse at night.  No fever, no wheezing, no sob, no hx/o asthma, nonsmoker.  No other aggravating or relieving factors. No other complaint.   Past Medical History:  Diagnosis Date  . Anemia    Chronic problem for patient.  Followed by Heme, receives IV iron transfusions secondary to inability to tolerate PO supplements.    . Arthritis    bilateral knees  . Headache    Migraines  . Hypothyroidism    post-radiation at age 56  . Shortness of breath dyspnea    Current Outpatient Medications on File Prior to Visit  Medication Sig Dispense Refill  . cholecalciferol (VITAMIN D) 1000 units tablet Take 1 tablet (1,000 Units total) by mouth daily. 90 tablet 3  . fluticasone (FLONASE) 50 MCG/ACT nasal spray Place 2 sprays into both nostrils daily. 16 g 6  . SYNTHROID 125 MCG tablet TAKE 1 TABLET BY MOUTH ONCE DAILY BEFORE BREAKFAST 90 tablet 1  . amoxicillin (AMOXIL) 875 MG tablet Take 1 tablet (875 mg total) by mouth 2 (two) times daily. (Patient not taking: Reported on 10/09/2018) 20 tablet 0  . amphetamine-dextroamphetamine (ADDERALL) 10 MG tablet Take 1 tablet (10 mg total) by mouth 2 (two) times daily. (Patient not taking: Reported on 09/07/2018) 60 tablet 0   No current facility-administered medications on file prior to visit.    ROS as in subjective   Objective: BP 108/68 (BP Location: Left Arm, Patient Position: Sitting)   Pulse 75   Temp 98 F (36.7 C)   Wt 178 lb (80.7 kg)   LMP 11/07/2015   SpO2 99%   BMI 31.53 kg/m   General appearance: alert, no distress, WD/WN,  HEENT: normocephalic, sclerae anicteric, TMs pearly, nares patent, no discharge or erythema, pharynx normal Oral  cavity: MMM, no lesions Neck: supple, no lymphadenopathy, no thyromegaly, no masses Heart: RRR, normal S1, S2, no murmurs Lungs: CTA bilaterally, no wheezes, rhonchi, or rales Pulses: 2+ symmetric, upper and lower extremities, normal cap refill   Assessment: Encounter Diagnoses  Name Primary?  . Cough Yes  . Post-nasal drainage     Plan: Discussed symptoms, recommendations, and exam suggest lingering cough and post nasal drainage.   Medication below, rest, hydrate well and symptoms gradually resolve  Arabelle was seen today for other.  Diagnoses and all orders for this visit:  Cough  Post-nasal drainage  Other orders -     chlorpheniramine-HYDROcodone (TUSSIONEX PENNKINETIC ER) 10-8 MG/5ML SUER; Take 5 mLs by mouth every 12 (twelve) hours as needed for cough.

## 2018-11-20 DIAGNOSIS — F419 Anxiety disorder, unspecified: Secondary | ICD-10-CM | POA: Diagnosis not present

## 2018-11-20 DIAGNOSIS — F3289 Other specified depressive episodes: Secondary | ICD-10-CM | POA: Diagnosis not present

## 2018-11-27 DIAGNOSIS — F3289 Other specified depressive episodes: Secondary | ICD-10-CM | POA: Diagnosis not present

## 2018-11-27 DIAGNOSIS — F419 Anxiety disorder, unspecified: Secondary | ICD-10-CM | POA: Diagnosis not present

## 2018-12-04 DIAGNOSIS — F419 Anxiety disorder, unspecified: Secondary | ICD-10-CM | POA: Diagnosis not present

## 2018-12-04 DIAGNOSIS — F3289 Other specified depressive episodes: Secondary | ICD-10-CM | POA: Diagnosis not present

## 2019-01-13 DIAGNOSIS — E039 Hypothyroidism, unspecified: Secondary | ICD-10-CM | POA: Diagnosis not present

## 2019-01-13 DIAGNOSIS — J3489 Other specified disorders of nose and nasal sinuses: Secondary | ICD-10-CM | POA: Diagnosis not present

## 2019-02-14 DIAGNOSIS — F419 Anxiety disorder, unspecified: Secondary | ICD-10-CM | POA: Diagnosis not present

## 2019-02-14 DIAGNOSIS — E890 Postprocedural hypothyroidism: Secondary | ICD-10-CM | POA: Diagnosis not present

## 2019-02-14 DIAGNOSIS — E038 Other specified hypothyroidism: Secondary | ICD-10-CM | POA: Diagnosis not present

## 2019-02-14 DIAGNOSIS — Z7689 Persons encountering health services in other specified circumstances: Secondary | ICD-10-CM | POA: Diagnosis not present

## 2019-02-14 DIAGNOSIS — F988 Other specified behavioral and emotional disorders with onset usually occurring in childhood and adolescence: Secondary | ICD-10-CM | POA: Diagnosis not present

## 2019-02-21 ENCOUNTER — Ambulatory Visit: Payer: Self-pay | Admitting: Nurse Practitioner

## 2019-02-23 IMAGING — CR DG CHEST 2V
1 series · 2 of 2 positions shown · non-contrast
Comparison: 08/15/2015

CLINICAL DATA: Chest pain

EXAM:
CHEST - 2 VIEW

[Series 1: w chest pa · 0.14mm/px · 2 of 2 slices shown]
[im 1/2]
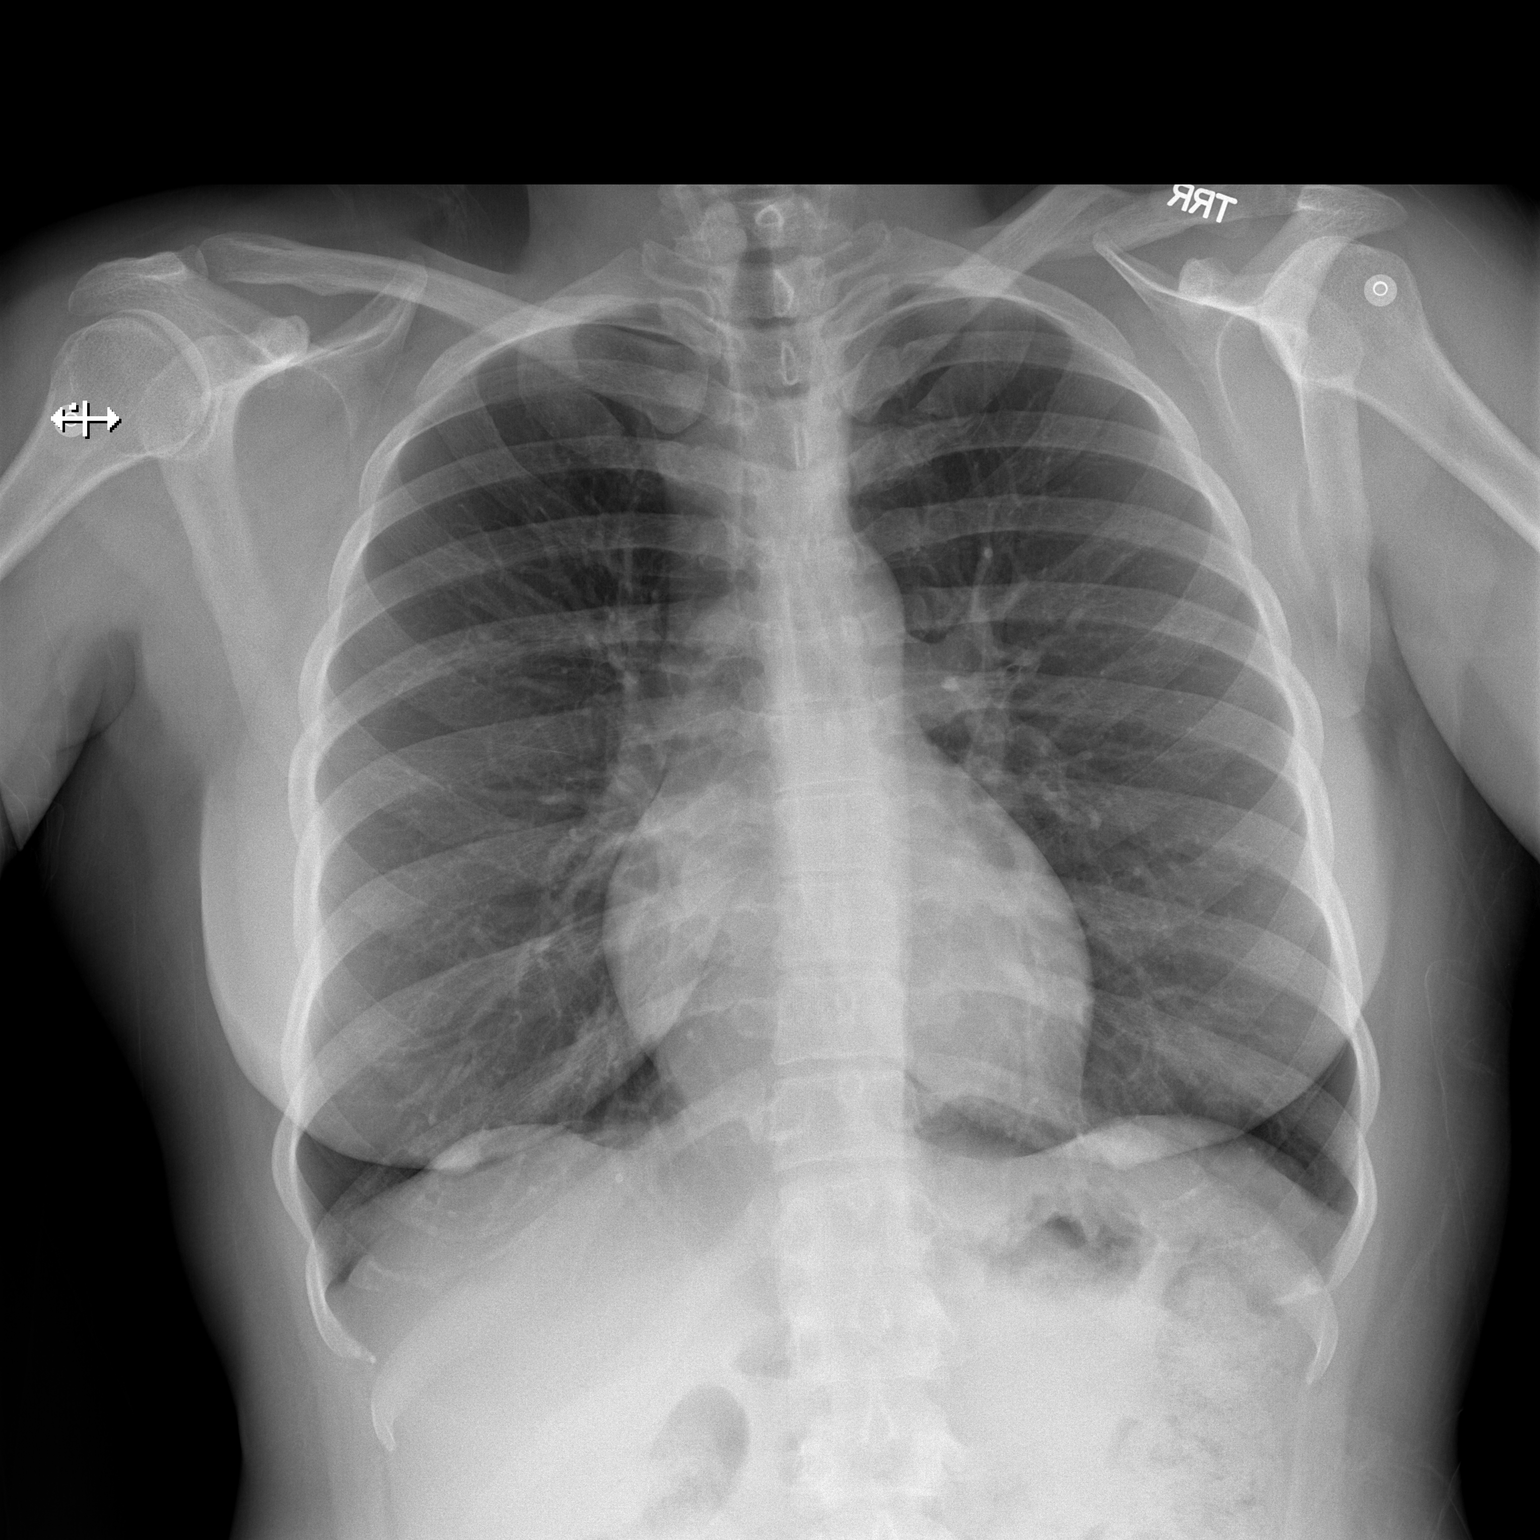
[im 2/2]
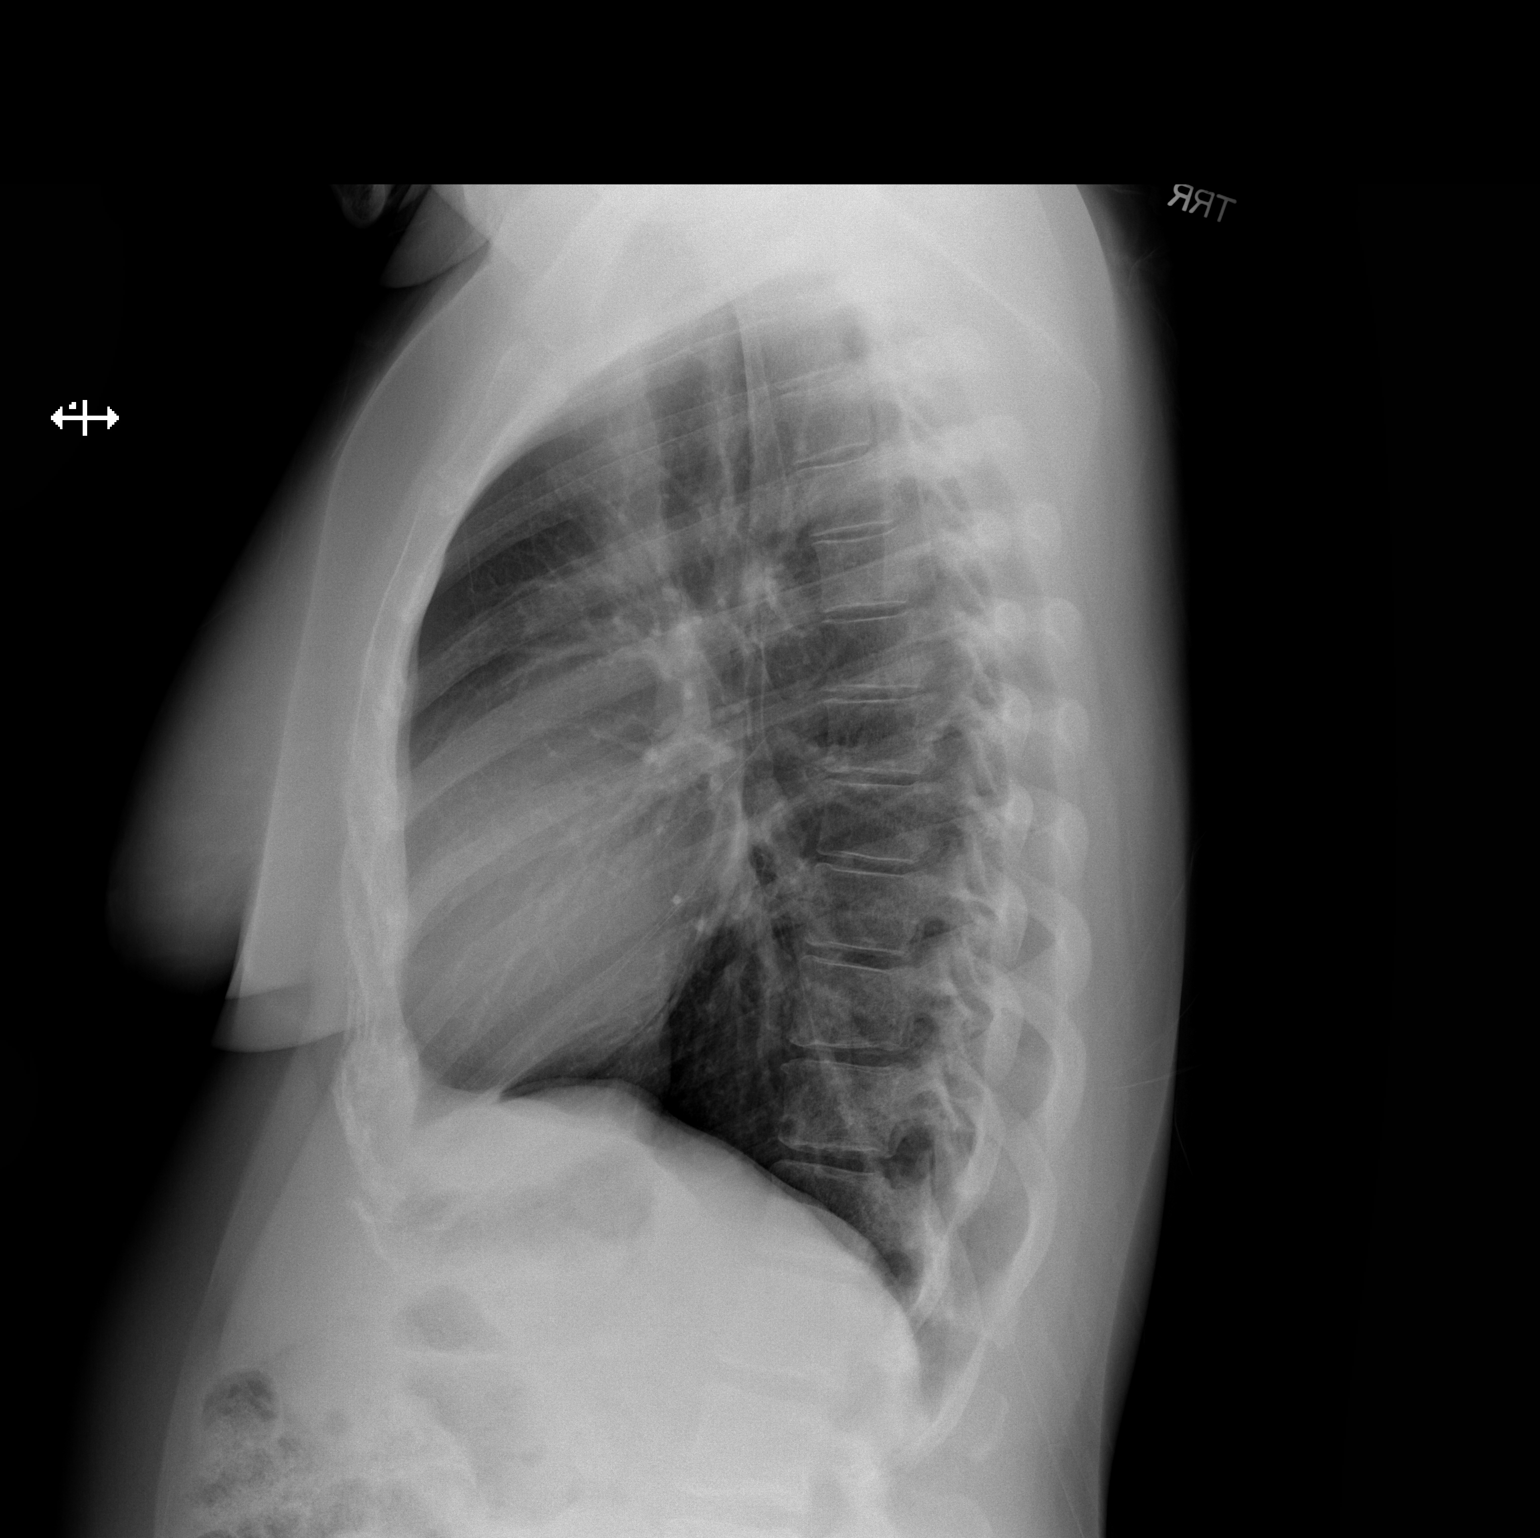

[2 of 2 positions shown; findings below may reference images not displayed]

FINDINGS: Heart and mediastinal contours are within normal limits. No focal
opacities or effusions. No acute bony abnormality.
IMPRESSION: No active cardiopulmonary disease.

## 2019-03-19 DIAGNOSIS — H5713 Ocular pain, bilateral: Secondary | ICD-10-CM | POA: Diagnosis not present

## 2019-03-27 DIAGNOSIS — Z713 Dietary counseling and surveillance: Secondary | ICD-10-CM | POA: Diagnosis not present

## 2019-03-27 DIAGNOSIS — F909 Attention-deficit hyperactivity disorder, unspecified type: Secondary | ICD-10-CM | POA: Diagnosis not present

## 2019-03-27 DIAGNOSIS — Z0000 Encounter for general adult medical examination without abnormal findings: Secondary | ICD-10-CM | POA: Diagnosis not present

## 2019-03-27 DIAGNOSIS — G8929 Other chronic pain: Secondary | ICD-10-CM | POA: Diagnosis not present

## 2019-03-27 DIAGNOSIS — M25562 Pain in left knee: Secondary | ICD-10-CM | POA: Diagnosis not present

## 2019-03-27 DIAGNOSIS — Z136 Encounter for screening for cardiovascular disorders: Secondary | ICD-10-CM | POA: Diagnosis not present

## 2019-03-27 DIAGNOSIS — Z130 Encounter for screening for diseases of the blood and blood-forming organs and certain disorders involving the immune mechanism: Secondary | ICD-10-CM | POA: Diagnosis not present

## 2019-03-27 DIAGNOSIS — Z79899 Other long term (current) drug therapy: Secondary | ICD-10-CM | POA: Diagnosis not present

## 2019-03-27 DIAGNOSIS — Z1159 Encounter for screening for other viral diseases: Secondary | ICD-10-CM | POA: Diagnosis not present

## 2019-03-27 DIAGNOSIS — F988 Other specified behavioral and emotional disorders with onset usually occurring in childhood and adolescence: Secondary | ICD-10-CM | POA: Diagnosis not present

## 2019-03-27 DIAGNOSIS — Z13220 Encounter for screening for lipoid disorders: Secondary | ICD-10-CM | POA: Diagnosis not present

## 2019-03-27 DIAGNOSIS — Z13228 Encounter for screening for other metabolic disorders: Secondary | ICD-10-CM | POA: Diagnosis not present

## 2019-04-04 DIAGNOSIS — F909 Attention-deficit hyperactivity disorder, unspecified type: Secondary | ICD-10-CM | POA: Diagnosis not present

## 2019-04-04 DIAGNOSIS — Z1321 Encounter for screening for nutritional disorder: Secondary | ICD-10-CM | POA: Diagnosis not present

## 2019-04-04 DIAGNOSIS — Z79899 Other long term (current) drug therapy: Secondary | ICD-10-CM | POA: Diagnosis not present

## 2019-04-04 DIAGNOSIS — R718 Other abnormality of red blood cells: Secondary | ICD-10-CM | POA: Diagnosis not present

## 2019-04-09 DIAGNOSIS — G8929 Other chronic pain: Secondary | ICD-10-CM | POA: Diagnosis not present

## 2019-04-09 DIAGNOSIS — M25562 Pain in left knee: Secondary | ICD-10-CM | POA: Diagnosis not present

## 2019-04-16 DIAGNOSIS — Z20828 Contact with and (suspected) exposure to other viral communicable diseases: Secondary | ICD-10-CM | POA: Diagnosis not present

## 2019-04-27 DIAGNOSIS — Z20828 Contact with and (suspected) exposure to other viral communicable diseases: Secondary | ICD-10-CM | POA: Diagnosis not present

## 2019-06-13 DIAGNOSIS — F419 Anxiety disorder, unspecified: Secondary | ICD-10-CM | POA: Diagnosis not present

## 2019-06-13 DIAGNOSIS — F900 Attention-deficit hyperactivity disorder, predominantly inattentive type: Secondary | ICD-10-CM | POA: Diagnosis not present

## 2019-06-13 DIAGNOSIS — F329 Major depressive disorder, single episode, unspecified: Secondary | ICD-10-CM | POA: Diagnosis not present

## 2019-06-22 DIAGNOSIS — F329 Major depressive disorder, single episode, unspecified: Secondary | ICD-10-CM | POA: Diagnosis not present

## 2019-06-22 DIAGNOSIS — F900 Attention-deficit hyperactivity disorder, predominantly inattentive type: Secondary | ICD-10-CM | POA: Diagnosis not present

## 2019-06-22 DIAGNOSIS — F419 Anxiety disorder, unspecified: Secondary | ICD-10-CM | POA: Diagnosis not present

## 2019-06-29 DIAGNOSIS — F419 Anxiety disorder, unspecified: Secondary | ICD-10-CM | POA: Diagnosis not present

## 2019-06-29 DIAGNOSIS — F900 Attention-deficit hyperactivity disorder, predominantly inattentive type: Secondary | ICD-10-CM | POA: Diagnosis not present

## 2019-06-29 DIAGNOSIS — F329 Major depressive disorder, single episode, unspecified: Secondary | ICD-10-CM | POA: Diagnosis not present

## 2019-07-04 DIAGNOSIS — F900 Attention-deficit hyperactivity disorder, predominantly inattentive type: Secondary | ICD-10-CM | POA: Diagnosis not present

## 2019-07-04 DIAGNOSIS — F419 Anxiety disorder, unspecified: Secondary | ICD-10-CM | POA: Diagnosis not present

## 2019-07-04 DIAGNOSIS — F329 Major depressive disorder, single episode, unspecified: Secondary | ICD-10-CM | POA: Diagnosis not present

## 2019-07-11 DIAGNOSIS — F900 Attention-deficit hyperactivity disorder, predominantly inattentive type: Secondary | ICD-10-CM | POA: Diagnosis not present

## 2019-07-11 DIAGNOSIS — F419 Anxiety disorder, unspecified: Secondary | ICD-10-CM | POA: Diagnosis not present

## 2019-07-11 DIAGNOSIS — F329 Major depressive disorder, single episode, unspecified: Secondary | ICD-10-CM | POA: Diagnosis not present

## 2019-12-20 DIAGNOSIS — R208 Other disturbances of skin sensation: Secondary | ICD-10-CM | POA: Diagnosis not present

## 2020-01-07 DIAGNOSIS — Z20822 Contact with and (suspected) exposure to covid-19: Secondary | ICD-10-CM | POA: Diagnosis not present

## 2020-01-07 DIAGNOSIS — Z9109 Other allergy status, other than to drugs and biological substances: Secondary | ICD-10-CM | POA: Diagnosis not present

## 2020-01-07 DIAGNOSIS — R05 Cough: Secondary | ICD-10-CM | POA: Diagnosis not present

## 2020-01-07 DIAGNOSIS — Z20828 Contact with and (suspected) exposure to other viral communicable diseases: Secondary | ICD-10-CM | POA: Diagnosis not present

## 2020-01-07 DIAGNOSIS — R0602 Shortness of breath: Secondary | ICD-10-CM | POA: Diagnosis not present

## 2020-01-29 ENCOUNTER — Emergency Department
Admission: EM | Admit: 2020-01-29 | Discharge: 2020-01-29 | Disposition: A | Discharge status: Home / Self Care | Payer: BC Managed Care – PPO | Attending: Emergency Medicine | Admitting: Emergency Medicine

## 2020-01-29 ENCOUNTER — Other Ambulatory Visit: Payer: Self-pay

## 2020-01-29 ENCOUNTER — Emergency Department: Payer: BC Managed Care – PPO

## 2020-01-29 ENCOUNTER — Encounter: Payer: Self-pay | Admitting: Emergency Medicine

## 2020-01-29 DIAGNOSIS — J069 Acute upper respiratory infection, unspecified: Secondary | ICD-10-CM

## 2020-01-29 DIAGNOSIS — R05 Cough: Secondary | ICD-10-CM | POA: Diagnosis not present

## 2020-01-29 DIAGNOSIS — Z79899 Other long term (current) drug therapy: Secondary | ICD-10-CM | POA: Diagnosis not present

## 2020-01-29 DIAGNOSIS — E890 Postprocedural hypothyroidism: Secondary | ICD-10-CM | POA: Diagnosis not present

## 2020-01-29 DIAGNOSIS — B9789 Other viral agents as the cause of diseases classified elsewhere: Secondary | ICD-10-CM | POA: Diagnosis not present

## 2020-01-29 MED ORDER — PREDNISONE 20 MG PO TABS
60.0000 mg | ORAL_TABLET | Freq: Once | ORAL | Status: AC
  Administered 2020-01-29: 09:00:00 60 mg via ORAL
  Filled 2020-01-29: qty 3

## 2020-01-29 MED ORDER — METHYLPREDNISOLONE 4 MG PO TBPK: ORAL_TABLET | ORAL | 0 refills | Status: DC

## 2020-01-29 MED ORDER — HYDROCOD POLST-CPM POLST ER 10-8 MG/5ML PO SUER: 5.0000 mL | Freq: Two times a day (BID) | ORAL | 0 refills | Status: DC

## 2020-01-29 MED ORDER — BENZONATATE 100 MG PO CAPS: 200.0000 mg | ORAL_CAPSULE | Freq: Three times a day (TID) | ORAL | 0 refills | Status: DC | PRN

## 2020-01-29 MED ORDER — HYDROCOD POLST-CPM POLST ER 10-8 MG/5ML PO SUER
5.0000 mL | Freq: Once | ORAL | Status: AC
  Administered 2020-01-29: 5 mL via ORAL
  Filled 2020-01-29: qty 5

## 2020-01-29 NOTE — ED Notes (Signed)
See triage note  Presents with cont'd dry cough  States she was positive for COVID in Feb but cough has not stopped  Afebrile on arrival

## 2020-01-29 NOTE — ED Provider Notes (Signed)
[facility] Emergency Department Provider Note   ____________________________________________   First MD Initiated Contact with Patient 01/29/20 0813     (approximate)  I have reviewed the triage vital signs and the nursing notes.   HISTORY  Chief Complaint Cough    HPI Miranda Fowler is a 39 y.o. female patient complain of persistent cough which is nonproductive since the end of February 2021.  Patient states she was diagnosed with Covid 19 during that timeframe.  Patient states her PCP gave her inhaler for asthma type symptoms but she states she still continues to have the cough.  Patient is a cough increases with deep breathing and is is worse at night.  Patient said her job is concerned that she continues to have Covid symptoms and asked for reevaluation.         Past Medical History:  Diagnosis Date  . Anemia    Chronic problem for patient.  Followed by Heme, receives IV iron transfusions secondary to inability to tolerate PO supplements.    . Arthritis    bilateral knees  . Headache    Migraines  . Hypothyroidism    post-radiation at age 42  . Shortness of breath dyspnea     Patient Active Problem List   Diagnosis Date Noted  . Abnormal echocardiogram 08/30/2018  . Anxiety 03/22/2018  . Attention and concentration deficit 01/05/2018  . Forgetfulness 01/05/2018  . Hyperglycemia 10/28/2016  . Routine general medical examination at a health care facility 10/28/2016  . Daytime somnolence 07/08/2016  . Snoring 07/08/2016  . Chronic fatigue 07/08/2016  . Knee pain, bilateral 07/08/2016  . Influenza vaccination declined 07/08/2016  . Postoperative state 04/06/2016  . History of transfusion 02/09/2016  . Foot swelling 01/08/2016  . SOB (shortness of breath)   . Anemia 11/07/2015  . Right shoulder pain 04/01/2014  . Screen for STD (sexually transmitted disease) 11/05/2013  . Hypothyroidism 04/06/2010  . Iron deficiency anemia  04/06/2010    Past Surgical History:  Procedure Laterality Date  . BILATERAL SALPINGECTOMY Bilateral 2008   pt denies  . CESAREAN SECTION    . CYSTOSCOPY N/A 04/06/2016   Procedure: CYSTOSCOPY;  Surgeon: Terrance Mass, MD;  Location: [facility];  Service: Gynecology;  Laterality: N/A;  . LAPAROSCOPIC ASSISTED VAGINAL HYSTERECTOMY N/A 04/06/2016   Procedure: LAPAROSCOPIC ASSISTED VAGINAL HYSTERECTOMY,  Bilateral Salpingectomy;  Surgeon: Terrance Mass, MD;  Location: [facility];  Service: Gynecology;  Laterality: N/A;  . TUBAL LIGATION      Prior to Admission medications   Medication Sig Start Date End Date Taking? Authorizing Provider  benzonatate (TESSALON PERLES) 100 MG capsule Take 2 capsules (200 mg total) by mouth 3 (three) times daily as needed. 01/29/20 01/28/21  Sable Feil, PA-C  chlorpheniramine-HYDROcodone (TUSSIONEX PENNKINETIC ER) 10-8 MG/5ML SUER Take 5 mLs by mouth 2 (two) times daily. 01/29/20   Sable Feil, PA-C  cholecalciferol (VITAMIN D) 1000 units tablet Take 1 tablet (1,000 Units total) by mouth daily. 01/06/18   Tysinger, Camelia Eng, PA-C  fluticasone (FLONASE) 50 MCG/ACT nasal spray Place 2 sprays into both nostrils daily. 08/30/18   Tysinger, Camelia Eng, PA-C  methylPREDNISolone (MEDROL DOSEPAK) 4 MG TBPK tablet Take Tapered dose as directed starting Wednesday morning. 01/29/20   Sable Feil, PA-C  SYNTHROID 125 MCG tablet TAKE 1 TABLET BY MOUTH ONCE DAILY BEFORE BREAKFAST 08/31/18   Tysinger, Camelia Eng, PA-C    Allergies Patient has no known allergies.  Family History  Problem Relation Age of Onset  . Diabetes Maternal Grandmother   . Cancer Paternal Aunt        breast  . Heart disease Paternal Grandmother   . Anemia Mother   . Lupus Mother   . Sickle cell trait Mother   . Hypertension Father   . Sickle cell trait Sister   . Diabetes Maternal Grandfather   . Cancer Paternal Aunt        cervical    Social History Social History   Tobacco Use  .  Smoking status: Never Smoker  . Smokeless tobacco: Never Used  Substance Use Topics  . Alcohol use: No    Alcohol/week: 0.0 standard drinks    Comment: wine  . Drug use: No    Review of Systems Constitutional: No fever/chills.  Fatigue Eyes: No visual changes. ENT: No sore throat. Cardiovascular: Denies chest pain. Respiratory: Denies shortness of breath.  Nonproductive cough. Gastrointestinal: No abdominal pain.  No nausea, no vomiting.  No diarrhea.  No constipation. Genitourinary: Negative for dysuria. Musculoskeletal: Negative for back pain. Skin: Negative for rash. Neurological: Negative for headaches, focal weakness or numbness. Psychiatric:  Anxiety Endocrine:  Hypothyroidism Hematological/Lymphatic:  Anemia   ____________________________________________   PHYSICAL EXAM:  VITAL SIGNS: ED Triage Vitals [01/29/20 0112]  Enc Vitals Group     BP (!) 131/92     Pulse Rate 69     Resp 18     Temp 98.5 F (36.9 C)     Temp Source Oral     SpO2 100 %     Weight 175 lb (79.4 kg)     Height 5\' 3"  (1.6 m)     Head Circumference      Peak Flow      Pain Score 0     Pain Loc      Pain Edu?      Excl. in [place]?    Constitutional: Alert and oriented. Well appearing and in no acute distress. Nose: No congestion/rhinnorhea. Mouth/Throat: Mucous membranes are moist.  Oropharynx non-erythematous. Neck: No stridor.   Hematological/Lymphatic/Immunilogical: No cervical lymphadenopathy. Cardiovascular: Normal rate, regular rhythm. Grossly normal heart sounds.  Good peripheral circulation. Respiratory: Normal respiratory effort.  No retractions. Lungs CTAB. Gastrointestinal: Soft and nontender. No distention. No abdominal bruits. No CVA tenderness. Genitourinary: Deferred Musculoskeletal: No lower extremity tenderness nor edema.  No joint effusions. Neurologic:  Normal speech and language. No gross focal neurologic deficits are appreciated. No gait instability. Skin:  Skin is  warm, dry and intact. No rash noted. Psychiatric: Mood and affect are normal. Speech and behavior are normal.  ____________________________________________   LABS (all labs ordered are listed, but only abnormal results are displayed)  Labs Reviewed - No data to display ____________________________________________  EKG   ____________________________________________  RADIOLOGY  ED MD interpretation:    Official radiology report(s): DG Chest 2 View  Result Date: 01/29/2020 CLINICAL DATA:  Cough EXAM: CHEST - 2 VIEW COMPARISON:  12/28/2017 FINDINGS: The heart size and mediastinal contours are within normal limits. Both lungs are clear. The visualized skeletal structures are unremarkable. IMPRESSION: No active cardiopulmonary disease. Electronically Signed   By: Ulyses Jarred M.D.   On: 01/29/2020 02:10    ____________________________________________   PROCEDURES  Procedure(s) performed (including Critical Care):  Procedures   ____________________________________________   INITIAL IMPRESSION / ASSESSMENT AND PLAN / ED COURSE  As part of my medical decision making, I reviewed the following data within the [place]  Presents with persistent cough status post diagnosed with COVID-19 in February 2021.  Discussed negative x-ray findings of the chest with patient.  Patient given discharge care instructions and a prescription for Medrol Dosepak, Tussionex, and Tessalon Perles.  Patient advised follow-up with PCP.   Jarrah R Roedel was evaluated in Emergency Department on 01/29/2020 for the symptoms described in the history of present illness. She was evaluated in the context of the global COVID-19 pandemic, which necessitated consideration that the patient might be at risk for infection with the SARS-CoV-2 virus that causes COVID-19. Institutional protocols and algorithms that pertain to the evaluation of patients at risk for COVID-19 are in a state of rapid  change based on information released by regulatory bodies including the CDC and federal and state organizations. These policies and algorithms were followed during the patient's care in the ED.       ____________________________________________   FINAL CLINICAL IMPRESSION(S) / ED DIAGNOSES  Final diagnoses:  Viral URI with cough     ED Discharge Orders         Ordered    chlorpheniramine-HYDROcodone (TUSSIONEX PENNKINETIC ER) 10-8 MG/5ML SUER  2 times daily     01/29/20 0825    benzonatate (TESSALON PERLES) 100 MG capsule  3 times daily PRN     01/29/20 0825    methylPREDNISolone (MEDROL DOSEPAK) 4 MG TBPK tablet     01/29/20 0825           Note:  This document was prepared using Dragon voice recognition software and may include unintentional dictation errors.    Sable Feil, PA-C 01/29/20 [place]    Duffy Bruce, MD 02/04/20 2143

## 2020-01-29 NOTE — Discharge Instructions (Signed)
Follow discharge care instruction take medication as directed. °

## 2020-01-29 NOTE — ED Triage Notes (Addendum)
Pt presents to ED with frequent dry non-productive cough. Pt states she had COVID the end of Feb and has not stopped coughing since then. Her PCP gave her an inhaler for asthma type symptoms but pt states it is not helping. Cough just not improve and is affecting her sleep. Cough present during triage. Pt needs note for work saying she does not have [place] again.

## 2020-01-31 DIAGNOSIS — R0602 Shortness of breath: Secondary | ICD-10-CM | POA: Diagnosis not present

## 2020-02-18 ENCOUNTER — Ambulatory Visit: Payer: BC Managed Care – PPO | Attending: Internal Medicine

## 2020-02-18 DIAGNOSIS — Z20822 Contact with and (suspected) exposure to covid-19: Secondary | ICD-10-CM

## 2020-02-19 LAB — SARS-COV-2, NAA 2 DAY TAT

## 2020-02-19 LAB — NOVEL CORONAVIRUS, NAA: SARS-CoV-2, NAA: NOT DETECTED

## 2020-03-19 DIAGNOSIS — M25561 Pain in right knee: Secondary | ICD-10-CM | POA: Diagnosis not present

## 2020-03-19 DIAGNOSIS — K5900 Constipation, unspecified: Secondary | ICD-10-CM | POA: Diagnosis not present

## 2020-03-19 DIAGNOSIS — G8929 Other chronic pain: Secondary | ICD-10-CM | POA: Diagnosis not present

## 2020-03-19 DIAGNOSIS — M25562 Pain in left knee: Secondary | ICD-10-CM | POA: Diagnosis not present

## 2020-04-15 DIAGNOSIS — F988 Other specified behavioral and emotional disorders with onset usually occurring in childhood and adolescence: Secondary | ICD-10-CM | POA: Diagnosis not present

## 2020-04-15 DIAGNOSIS — M25561 Pain in right knee: Secondary | ICD-10-CM | POA: Diagnosis not present

## 2020-04-15 DIAGNOSIS — K5900 Constipation, unspecified: Secondary | ICD-10-CM | POA: Diagnosis not present

## 2020-04-15 DIAGNOSIS — F3289 Other specified depressive episodes: Secondary | ICD-10-CM | POA: Diagnosis not present

## 2020-04-15 DIAGNOSIS — Z79899 Other long term (current) drug therapy: Secondary | ICD-10-CM | POA: Diagnosis not present

## 2020-04-15 DIAGNOSIS — E039 Hypothyroidism, unspecified: Secondary | ICD-10-CM | POA: Diagnosis not present

## 2020-04-15 DIAGNOSIS — Z7951 Long term (current) use of inhaled steroids: Secondary | ICD-10-CM | POA: Diagnosis not present

## 2020-04-15 DIAGNOSIS — E538 Deficiency of other specified B group vitamins: Secondary | ICD-10-CM | POA: Diagnosis not present

## 2020-04-15 DIAGNOSIS — Z0001 Encounter for general adult medical examination with abnormal findings: Secondary | ICD-10-CM | POA: Diagnosis not present

## 2020-04-15 DIAGNOSIS — D509 Iron deficiency anemia, unspecified: Secondary | ICD-10-CM | POA: Diagnosis not present

## 2020-04-15 DIAGNOSIS — M25562 Pain in left knee: Secondary | ICD-10-CM | POA: Diagnosis not present

## 2020-05-21 DIAGNOSIS — Z20822 Contact with and (suspected) exposure to covid-19: Secondary | ICD-10-CM | POA: Diagnosis not present

## 2020-07-02 DIAGNOSIS — F988 Other specified behavioral and emotional disorders with onset usually occurring in childhood and adolescence: Secondary | ICD-10-CM | POA: Diagnosis not present

## 2020-07-02 DIAGNOSIS — D509 Iron deficiency anemia, unspecified: Secondary | ICD-10-CM | POA: Diagnosis not present

## 2020-07-02 DIAGNOSIS — K5901 Slow transit constipation: Secondary | ICD-10-CM | POA: Diagnosis not present

## 2020-07-02 DIAGNOSIS — Z0000 Encounter for general adult medical examination without abnormal findings: Secondary | ICD-10-CM | POA: Diagnosis not present

## 2020-07-02 DIAGNOSIS — E039 Hypothyroidism, unspecified: Secondary | ICD-10-CM | POA: Diagnosis not present

## 2020-12-03 ENCOUNTER — Other Ambulatory Visit: Payer: Self-pay

## 2020-12-03 ENCOUNTER — Ambulatory Visit (INDEPENDENT_AMBULATORY_CARE_PROVIDER_SITE_OTHER): Payer: 59 | Admitting: Family Medicine

## 2020-12-03 ENCOUNTER — Encounter: Payer: Self-pay | Admitting: Family Medicine

## 2020-12-03 VITALS — BP 120/70 | HR 64 | Ht 62.75 in | Wt 176.6 lb

## 2020-12-03 DIAGNOSIS — R6889 Other general symptoms and signs: Secondary | ICD-10-CM

## 2020-12-03 DIAGNOSIS — E039 Hypothyroidism, unspecified: Secondary | ICD-10-CM | POA: Diagnosis not present

## 2020-12-03 DIAGNOSIS — F32A Depression, unspecified: Secondary | ICD-10-CM

## 2020-12-03 DIAGNOSIS — K5909 Other constipation: Secondary | ICD-10-CM | POA: Diagnosis not present

## 2020-12-03 DIAGNOSIS — R41840 Attention and concentration deficit: Secondary | ICD-10-CM

## 2020-12-03 DIAGNOSIS — L853 Xerosis cutis: Secondary | ICD-10-CM

## 2020-12-03 DIAGNOSIS — D509 Iron deficiency anemia, unspecified: Secondary | ICD-10-CM | POA: Diagnosis not present

## 2020-12-03 DIAGNOSIS — Z1231 Encounter for screening mammogram for malignant neoplasm of breast: Secondary | ICD-10-CM

## 2020-12-03 LAB — COMPREHENSIVE METABOLIC PANEL: CO2: 21 mmol/L (ref 20–29)

## 2020-12-03 LAB — CBC WITH DIFFERENTIAL/PLATELET
Hemoglobin: 11.7 g/dL (ref 11.1–15.9)
MCH: 33.5 pg — ABNORMAL HIGH (ref 26.6–33.0)
Monocytes: 13 %

## 2020-12-03 NOTE — Patient Instructions (Addendum)
Make sure you are well hydrated (64 ounces of water throughout the day).  Try to get at least 150 minutes of activity per week.   Try getting plenty of fiber in your diet.   You can increase the Citrucel to daily and if needed, add Miralax (one cap full in 6-8 ounces of fluid) daily.   Make sure you are taking your thyroid medication everyday on an empty stomach for the next 4 weeks until we recheck your thyroid level.    CALL AND SCHEDULE YOUR MAMMOGRAM AT THE [facility]ENTER.    You can call to schedule your appointment with the counselor. A few offices are listed below for you to call.    [facility] [address] [place], [place] [address]  Phone: [facility]  [address], [place], [place] [address]  Phone: [phone]  [facility] [address] [place], [place] [address] [phone] for appointments   [facility]  [address] [place], [place] [address] [place]  [place]  (across from [facility])  [place] Resident only [address], [place], [place] [address] [phone]    [facility] [address] [place], [place] [address] [phone]

## 2020-12-03 NOTE — Progress Notes (Signed)
Subjective:    Patient ID: Miranda Fowler, female    DOB: 1981/01/04, 40 y.o.   MRN: [id number]  HPI Chief Complaint  Patient presents with  . new pt     New pt get established. Has trouble going to the bathroom. Can go 1-2 weeks without a BM, depression issues   She is fairly new to me today. I have seen her in the distant past but not since 11/2016.  She has been going to [place] [facility] more recently.   Concerns today include constipation, feeling cold and having very dry skin. She is also having depression.   Hypothyroidism with her last TSH abnormal 26.424 states she has not been taking her Synthroid daily.  States over the past 2 weeks she has missed approximately 4 doses.  States she sometimes forgets to take them another time she tries to make them "last longer". Hx of thyroid radiation in 2019?Marland Kitchen   Complains of constipation for the past 2 years. States she has always gone 1-2 weeks in between bowel movements. States she has noticed feeling bloated and full more recently. She is only using Citrucel as needed.  Denies history of IBD or ever seeing a GI.   Complains of feeling depressed more recently. History of anxiety and depression. No SI.  Has been in counseling in the past but it has been a long time.   History of anemia. States she had to have iron infusions.  Hysterectomy in 2017.    Takes Adderall as needed for ADHD. States when she needs to really focus the medication helps. She has been on different doses in the past and is not sure she is on the right dose. States she takes her medication in the morning and it lasts until lunch time. It is not keeping her awake. Her appetite is affected and she does not like this.    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    Denies fever, chills, unexplained weight loss, dizziness, chest pain, palpitations, shortness of breath, abdominal pain, N/V/, urinary symptoms, LE edema.     Depression screen [facility] 2/9 12/03/2020 01/05/2018 10/28/2016 01/08/2016 11/14/2015  Decreased Interest 1 1 0 0 0  Down, Depressed, Hopeless 1 0 0 0 0  PHQ - 2 Score 2 1 0 0 0  Altered sleeping 0 - - - -  Tired, decreased energy 1 - - - -  Change in appetite 0 - - - -  Feeling bad or failure about yourself  2 - - - -  Trouble concentrating 3 - - - -  Moving slowly or fidgety/restless 1 - - - -  Suicidal thoughts 0 - - - -  PHQ-9 Score 9 - - - -  Difficult doing work/chores Extremely dIfficult - - - -    Reviewed allergies, medications, past medical, surgical, family, and social history.   She has never had a mammogram and would like to have a screening one. No issues or concerns.   States she had 2 Covid vaccines but does not want the booster. Declines flu shot.     Review of Systems Pertinent positives and negatives in the history of present illness.     Objective:   Physical Exam BP 120/70   Pulse 64   Ht 5' 2.75" (1.594 m)   Wt 176 lb 9.6 oz (80.1 kg)   LMP 11/07/2015   BMI 31.53 kg/m   Alert and oriented and in no  distress.  Neck is supple without adenopathy or thyromegaly. Cardiac exam shows a regular sinus rhythm without murmurs or gallops. Lungs are clear to auscultation.  Abdomen is soft, nondistended, normal bowel sounds and nontender.  No palpable masses.  Extremities without edema.  Skin is warm and dry.  DTRs decreased and symmetric at the patella and Achilles.       Assessment & Plan:  Hypothyroidism, unspecified type - Plan: Comprehensive metabolic panel, TSH, T4, free, T3 -She is considered a new patient to me today since I have not seen her since February 2018.  Here today with several complaints.  She has reportedly been missing doses of her Synthroid.  States she can only take the brand.  Counseling done on how to properly take her thyroid medication and that this needs to be taken daily to be most effective. Discussed that several of her symptoms  including constipation, depression, cold intolerance and dry skin may all be related to hypothyroidism.  Her last TSH was 26.4.  We will recheck thyroid function today.  Discussed that we may need to change her dose of Synthroid but since she has not been taking it on a regular basis, we will most likely need her to take it daily for the next 4 weeks and have her follow-up.   Chronic constipation - Plan: CBC with Differential/Platelet, Comprehensive metabolic panel, TSH, T4, free, T3 -This is not new.  She does report an increase in discomfort due to only having a bowel movement once every week or 2.  Encouraged her to take Citrucel daily and she may even add MiraLAX 1 capful daily if needed.  Encouraged a diet high in fiber and getting plenty of fluids and activity.  Iron deficiency anemia, unspecified iron deficiency anemia type - Plan: CBC with Differential/Platelet, Comprehensive metabolic panel, Iron, TIBC and Ferritin Panel -Suspect this has improved since having a hysterectomy.  Check labs and follow-up  Depression, unspecified depression type -She does not appear to be self-medicating or have any thoughts of self-harm.  Check labs.  She has been in counseling in the past but not recently.  I advised her to call and schedule with a therapist.  A list was provided.  Cold intolerance - Plan: TSH, T4, free, T3  Dry skin - Plan: TSH, T4, free, T3  Encounter for screening mammogram for malignant neoplasm of breast - Plan: MM DIGITAL SCREENING BILATERAL -She will call and schedule her mammogram at the breast center.  This will be her first mammogram she denies any concerns.  Attention and concentration deficit -Continue taking Adderall as needed.

## 2020-12-04 LAB — IRON,TIBC AND FERRITIN PANEL
Ferritin: 298 ng/mL — ABNORMAL HIGH (ref 15–150)
Iron Saturation: 32 % (ref 15–55)
Iron: 77 ug/dL (ref 27–159)
Total Iron Binding Capacity: 237 ug/dL — ABNORMAL LOW (ref 250–450)
UIBC: 160 ug/dL (ref 131–425)

## 2020-12-04 LAB — CBC WITH DIFFERENTIAL/PLATELET
Basophils Absolute: 0 10*3/uL (ref 0.0–0.2)
Basos: 1 %
EOS (ABSOLUTE): 0 10*3/uL (ref 0.0–0.4)
Eos: 0 %
Hematocrit: 34.9 % (ref 34.0–46.6)
Immature Grans (Abs): 0 10*3/uL (ref 0.0–0.1)
Immature Granulocytes: 0 %
Lymphocytes Absolute: 2 10*3/uL (ref 0.7–3.1)
Lymphs: 48 %
MCHC: 33.5 g/dL (ref 31.5–35.7)
MCV: 100 fL — ABNORMAL HIGH (ref 79–97)
Monocytes Absolute: 0.5 10*3/uL (ref 0.1–0.9)
Neutrophils Absolute: 1.6 10*3/uL (ref 1.4–7.0)
Neutrophils: 38 %
Platelets: 245 10*3/uL (ref 150–450)
RBC: 3.49 x10E6/uL — ABNORMAL LOW (ref 3.77–5.28)
RDW: 11 % — ABNORMAL LOW (ref 11.7–15.4)
WBC: 4.2 10*3/uL (ref 3.4–10.8)

## 2020-12-04 LAB — COMPREHENSIVE METABOLIC PANEL
AST: 21 IU/L (ref 0–40)
Albumin/Globulin Ratio: 1.3 (ref 1.2–2.2)
Albumin: 4 g/dL (ref 3.8–4.8)
BUN/Creatinine Ratio: 17 (ref 9–23)
BUN: 16 mg/dL (ref 6–24)
Chloride: 102 mmol/L (ref 96–106)
Globulin, Total: 3.1 g/dL (ref 1.5–4.5)
Glucose: 84 mg/dL (ref 65–99)

## 2020-12-04 LAB — T3: T3, Total: 48 ng/dL — ABNORMAL LOW (ref 71–180)

## 2020-12-04 LAB — T4, FREE: Free T4: 0.39 ng/dL — ABNORMAL LOW (ref 0.82–1.77)

## 2020-12-04 LAB — TSH: TSH: 24.4 u[IU]/mL — ABNORMAL HIGH (ref 0.450–4.500)

## 2020-12-04 NOTE — Progress Notes (Signed)
Her labs show that she needs more thyroid medication in her system. Instead of increasing her dose of Synthroid, let's see what her thyroid function looks like in 4 weeks with her taking the medication everyday. I am ok with her treating the constipation as we discussed yesterday but her thyroid issue may be contributing to this.  I would also like to remind her to schedule with a counselor.

## 2020-12-31 ENCOUNTER — Encounter: Payer: Self-pay | Admitting: Family Medicine

## 2020-12-31 ENCOUNTER — Other Ambulatory Visit: Payer: Self-pay

## 2020-12-31 ENCOUNTER — Ambulatory Visit (INDEPENDENT_AMBULATORY_CARE_PROVIDER_SITE_OTHER): Payer: 59 | Admitting: Family Medicine

## 2020-12-31 VITALS — BP 120/80 | HR 67 | Wt 173.6 lb

## 2020-12-31 DIAGNOSIS — E039 Hypothyroidism, unspecified: Secondary | ICD-10-CM

## 2020-12-31 NOTE — Progress Notes (Signed)
   Subjective:    Patient ID: Miranda Fowler, female    DOB: 1981/02/17, 40 y.o.   MRN: [id number]  HPI Chief Complaint  Patient presents with  . follow-up    Follow-up on thyroid.    She is here to follow up on hypothyroidism. This is her 2nd visit with me. She was not taking her levothyroxine daily at her previous visit. Since then, she reports good daily compliance. Feeling good.   States she is having more bowel movements 2-3 a week now which is an improvement.  No new concerns.    Review of Systems     Objective:   Physical Exam BP 120/80   Pulse 67   Wt 173 lb 9.6 oz (78.7 kg)   LMP 11/07/2015   BMI 31.00 kg/m    Alert and oriented and no distress.      Assessment & Plan:  Hypothyroidism, unspecified type - Plan: TSH, T4, free  Taking levothyroxine daily now and appropriately. Check labs and follow up.

## 2021-01-01 ENCOUNTER — Ambulatory Visit
Admission: EM | Admit: 2021-01-01 | Discharge: 2021-01-01 | Disposition: A | Discharge status: Home / Self Care | Payer: 59 | Attending: Family Medicine | Admitting: Family Medicine

## 2021-01-01 ENCOUNTER — Other Ambulatory Visit: Payer: Self-pay | Admitting: Internal Medicine

## 2021-01-01 ENCOUNTER — Other Ambulatory Visit: Payer: Self-pay

## 2021-01-01 ENCOUNTER — Other Ambulatory Visit: Payer: Self-pay | Admitting: Family Medicine

## 2021-01-01 DIAGNOSIS — R109 Unspecified abdominal pain: Secondary | ICD-10-CM

## 2021-01-01 DIAGNOSIS — A084 Viral intestinal infection, unspecified: Secondary | ICD-10-CM | POA: Diagnosis not present

## 2021-01-01 DIAGNOSIS — Z79899 Other long term (current) drug therapy: Secondary | ICD-10-CM

## 2021-01-01 DIAGNOSIS — R079 Chest pain, unspecified: Secondary | ICD-10-CM

## 2021-01-01 DIAGNOSIS — E039 Hypothyroidism, unspecified: Secondary | ICD-10-CM

## 2021-01-01 DIAGNOSIS — R112 Nausea with vomiting, unspecified: Secondary | ICD-10-CM

## 2021-01-01 DIAGNOSIS — R1084 Generalized abdominal pain: Secondary | ICD-10-CM

## 2021-01-01 LAB — T4, FREE: Free T4: 1.8 ng/dL — ABNORMAL HIGH (ref 0.82–1.77)

## 2021-01-01 LAB — TSH: TSH: 0.448 u[IU]/mL — ABNORMAL LOW (ref 0.450–4.500)

## 2021-01-01 MED ORDER — DICYCLOMINE HCL 20 MG PO TABS: 20.0000 mg | ORAL_TABLET | Freq: Two times a day (BID) | ORAL | 0 refills | Status: DC

## 2021-01-01 MED ORDER — ONDANSETRON HCL 4 MG PO TABS: 4.0000 mg | ORAL_TABLET | Freq: Four times a day (QID) | ORAL | 0 refills | Status: DC

## 2021-01-01 MED ORDER — SYNTHROID 112 MCG PO TABS: 112.0000 ug | ORAL_TABLET | Freq: Every day | ORAL | 2 refills | Status: DC

## 2021-01-01 NOTE — Progress Notes (Signed)
I am reducing her levothyroxine dose now that her TSH is low. She will need a lab visit in 4 weeks for a TSH only. Medication management and hypothyroidism are the diagnoses. Thanks.

## 2021-01-01 NOTE — ED Triage Notes (Signed)
Pt presents with c/o abdomina pain and chest pain that has been intermittent for past few days

## 2021-01-01 NOTE — Discharge Instructions (Addendum)
EKG today normal today  I have sent in Zofran for you to take one tablet every 8 hours as needed for nausea.  I have sent in dicyclomine for you to take three times per day as needed for abdominal cramping  Follow up with this office or with primary care if symptoms are persisting.  Follow up in the ER for high fever, trouble swallowing, trouble breathing, other concerning symptoms.

## 2021-01-01 NOTE — ED Provider Notes (Signed)
[facility]   [id number] 01/01/21 Arrival Time: 1820  CC: ABDOMINAL PAIN  SUBJECTIVE:  Miranda Fowler is a 40 y.o. female who presents with abdominal pain, cramping and chest pain since yesterday. Denies a precipitating event, trauma, close contacts with similar symptoms, recent travel or antibiotic use. Reports abdominal cramping. Has taken pepto bismol with no relief. Reports that she vomited the medicaiton. Denies alleviating or aggravating factors. Denies similar symptoms in the past. Last BM today. Also reports chest discomfort with deep breathing.  Denies fever, chills, appetite changes, weight changes, SOB, diarrhea, constipation, hematochezia, melena, dysuria, difficulty urinating, increased frequency or urgency, flank pain, loss of bowel or bladder function, vaginal discharge, vaginal odor, vaginal bleeding, dyspareunia, pelvic pain.     Patient's last menstrual period was 11/07/2015.  ROS: As per HPI.  All other pertinent ROS negative.     Past Medical History:  Diagnosis Date  . Anemia    Chronic problem for patient.  Followed by Heme, receives IV iron transfusions secondary to inability to tolerate PO supplements.    . Arthritis    bilateral knees  . Headache    Migraines  . Hypothyroidism    post-radiation at age 38  . Shortness of breath dyspnea    Past Surgical History:  Procedure Laterality Date  . BILATERAL SALPINGECTOMY Bilateral 2008   pt denies  . CESAREAN SECTION    . CYSTOSCOPY N/A 04/06/2016   Procedure: CYSTOSCOPY;  Surgeon: Terrance Mass, MD;  Location: [facility];  Service: Gynecology;  Laterality: N/A;  . LAPAROSCOPIC ASSISTED VAGINAL HYSTERECTOMY N/A 04/06/2016   Procedure: LAPAROSCOPIC ASSISTED VAGINAL HYSTERECTOMY,  Bilateral Salpingectomy;  Surgeon: Terrance Mass, MD;  Location: [facility];  Service: Gynecology;  Laterality: N/A;  . TUBAL LIGATION     No Known Allergies No current facility-administered medications on file prior to  encounter.   Current Outpatient Medications on File Prior to Encounter  Medication Sig Dispense Refill  . SYNTHROID 112 MCG tablet Take 1 tablet (112 mcg total) by mouth daily before breakfast. 30 tablet 2   Social History   Socioeconomic History  . Marital status: Married    Spouse name: Not on file  . Number of children: Not on file  . Years of education: Not on file  . Highest education level: Not on file  Occupational History  . Not on file  Tobacco Use  . Smoking status: Never Smoker  . Smokeless tobacco: Never Used  Vaping Use  . Vaping Use: Never used  Substance and Sexual Activity  . Alcohol use: No    Alcohol/week: 0.0 standard drinks    Comment: wine  . Drug use: No  . Sexual activity: Yes    Birth control/protection: Injection, Surgical    Comment: TUBAL-LIAGTION  Other Topics Concern  . Not on file  Social History Narrative   Married, 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  10/2016   Social Determinants of Health   Financial Resource Strain: Not on file  Food Insecurity: Not on file  Transportation Needs: Not on file  Physical Activity: Not on file  Stress: Not on file  Social Connections: Not on file  Intimate Partner Violence: Not on file   Family History  Problem Relation Age of Onset  . Diabetes Maternal Grandmother   . Cancer Paternal Aunt        breast  . Heart disease Paternal Grandmother   . Anemia Mother   . Lupus Mother   .  Sickle cell trait Mother   . Hypertension Father   . Sickle cell trait Sister   . Diabetes Maternal Grandfather   . Cancer Paternal Aunt        cervical  . Colon cancer Paternal Uncle      OBJECTIVE:  Vitals:   01/01/21 1848  BP: 118/83  Pulse: (!) 58  Resp: 18  Temp: 98 F (36.7 C)  SpO2: 98%    General appearance: Alert; NAD HEENT: NCAT. Oropharynx clear.  Lungs: clear to auscultation bilaterally without adventitious breath sounds Heart: regular rate and rhythm.  Radial pulses 2+  symmetrical bilaterally Abdomen: soft, non-distended; normal active bowel sounds; mild generalized tenderness to deep palpation; nontender at McBurney's point; negative Murphy's sign; negative rebound; no guarding Back: no CVA tenderness Extremities: no edema; symmetrical with no gross deformities Skin: warm and dry Neurologic: normal gait Psychological: alert and cooperative; normal mood and affect  LABS: No results found for this or any previous visit (from the past 24 hour(s)).  DIAGNOSTIC STUDIES: No results found.   ASSESSMENT & PLAN:  1. Viral gastroenteritis   2. Generalized abdominal pain   3. Abdominal cramping   4. Nausea and vomiting, intractability of vomiting not specified, unspecified vomiting type   5. Chest pain, unspecified type    EKG normal in the office today Get rest and drink plenty of fluids Zofran prescribed.  Take as directed.   Dicyclomine prescribed  DIET Instructions:  30 minutes after taking nausea medicine, begin with sips of clear liquids. If able to hold down 2 - 4 ounces for 30 minutes, begin drinking more. Increase your fluid intake to replace losses. Clear liquids only for 24 hours (water, tea, sport drinks, clear flat ginger ale or cola and juices, broth, jello, popsicles, ect). Advance to bland foods, applesauce, rice, baked or boiled chicken, ect. Avoid milk, greasy foods and anything that doesn't agree with you.  If you experience new or worsening symptoms return or go to ER such as fever, chills, nausea, vomiting, diarrhea, bloody or dark tarry stools, constipation, urinary symptoms, worsening abdominal discomfort, symptoms that do not improve with medications, inability to keep fluids down.  Reviewed expectations re: course of current medical issues. Questions answered. Outlined signs and symptoms indicating need for more acute intervention. Patient verbalized understanding. After Visit Summary given.   Faustino Congress,  NP 01/01/21 1911

## 2021-01-29 ENCOUNTER — Other Ambulatory Visit: Payer: 59

## 2021-01-29 ENCOUNTER — Other Ambulatory Visit: Payer: Self-pay

## 2021-01-29 DIAGNOSIS — Z79899 Other long term (current) drug therapy: Secondary | ICD-10-CM

## 2021-01-29 DIAGNOSIS — E039 Hypothyroidism, unspecified: Secondary | ICD-10-CM

## 2021-01-30 LAB — TSH: TSH: 0.862 u[IU]/mL (ref 0.450–4.500)

## 2021-02-01 NOTE — Progress Notes (Signed)
Please let her know that her TSH is normal now. Continue current dose of Synthroid and let's recheck TSH in 3 months.

## 2021-02-26 ENCOUNTER — Ambulatory Visit: Payer: 59 | Admitting: Family Medicine

## 2021-03-26 IMAGING — CR DG CHEST 2V
1 series · 2 of 2 positions shown · non-contrast
Comparison: 12/28/2017

CLINICAL DATA: Cough

EXAM:
CHEST - 2 VIEW

[Series 1: dg chest 2 view · 0.14mm/px · 2 of 2 slices shown]
[im 1/2]
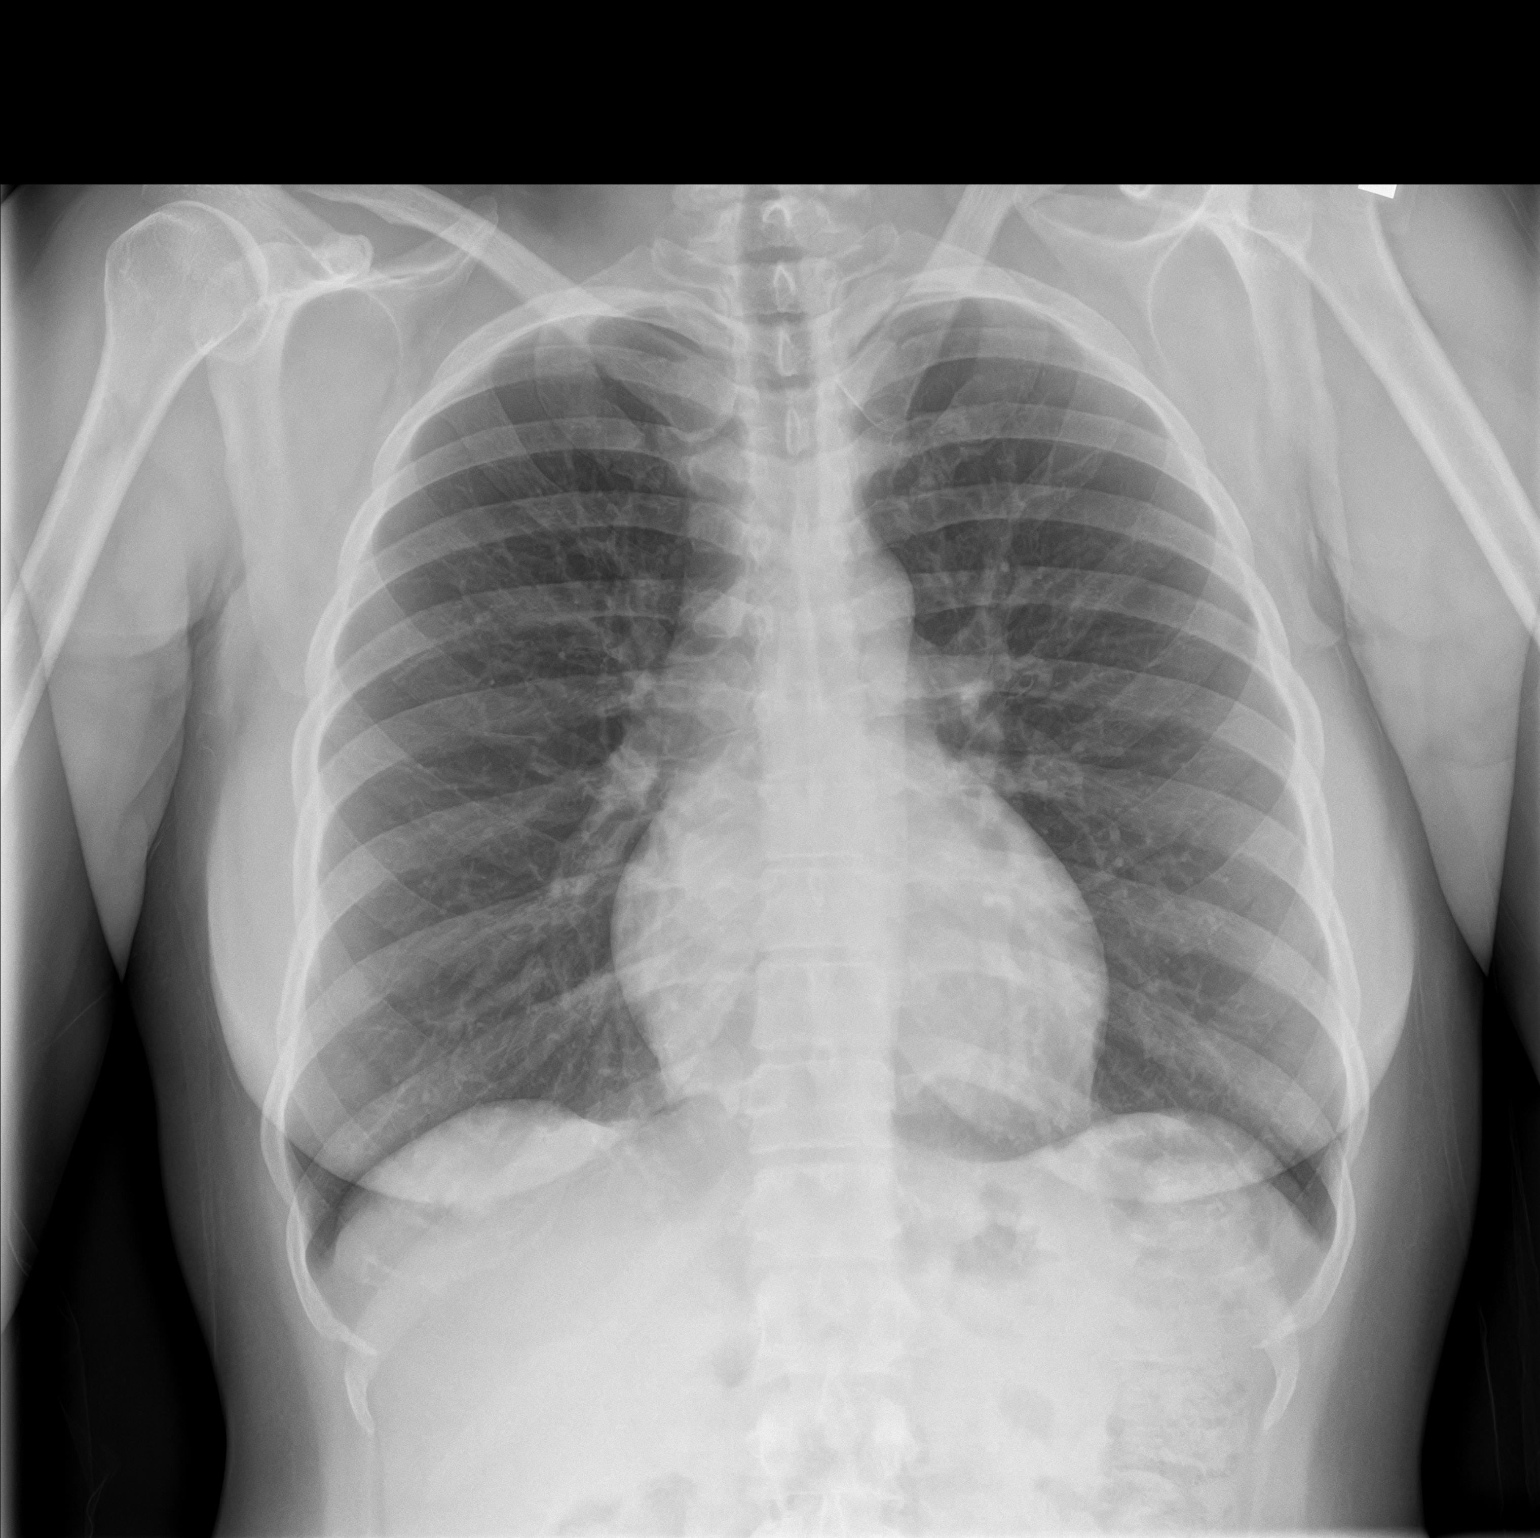
[im 2/2]
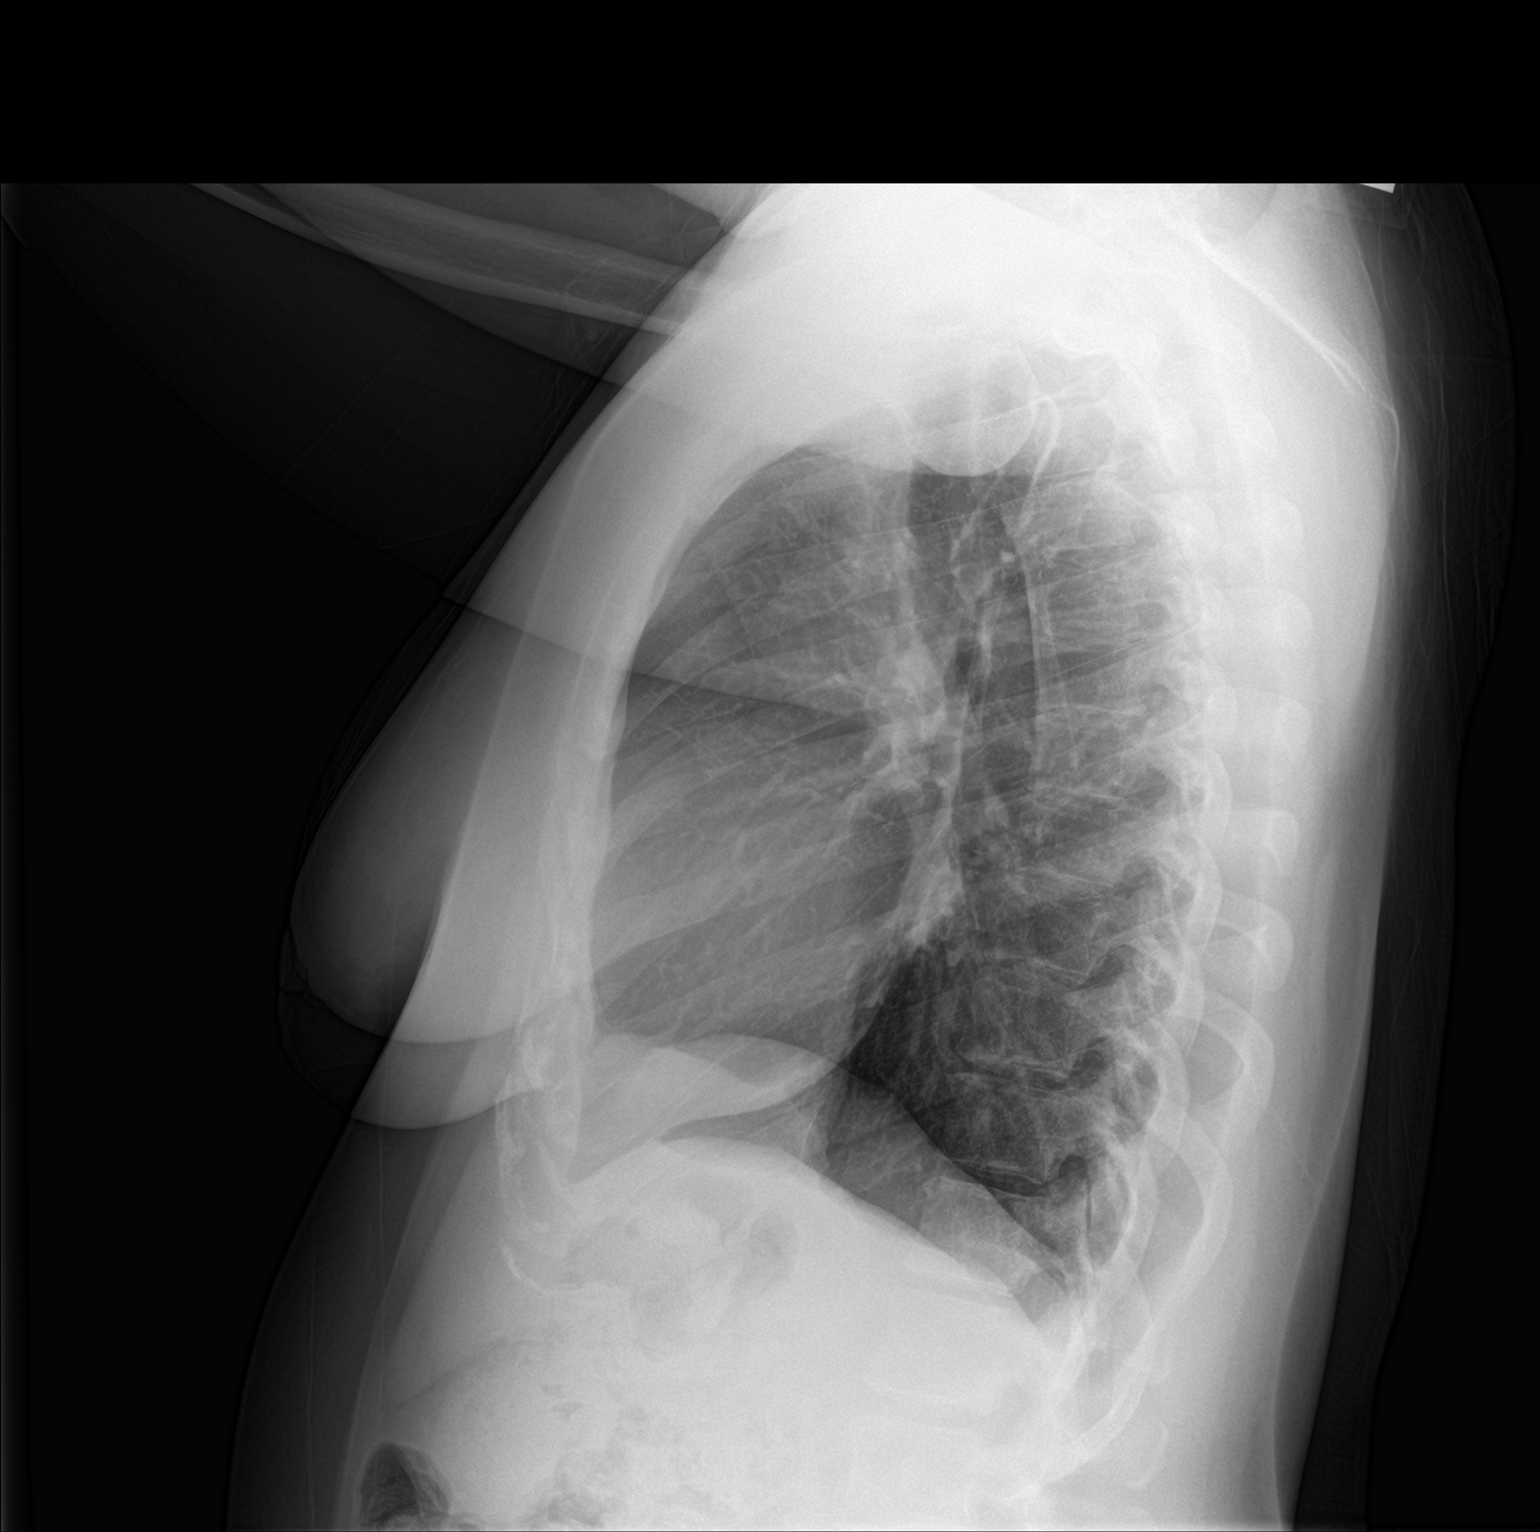

[2 of 2 positions shown; findings below may reference images not displayed]

FINDINGS: The heart size and mediastinal contours are within normal limits.
Both lungs are clear. The visualized skeletal structures are
unremarkable.
IMPRESSION: No active cardiopulmonary disease.

## 2021-04-23 ENCOUNTER — Encounter: Payer: 59 | Admitting: Family Medicine

## 2021-04-27 ENCOUNTER — Other Ambulatory Visit: Payer: Self-pay

## 2021-04-27 ENCOUNTER — Ambulatory Visit
Admission: RE | Admit: 2021-04-27 | Discharge: 2021-04-27 | Disposition: A | Discharge status: Home / Self Care | Payer: 59 | Source: Ambulatory Visit | Attending: Family Medicine | Admitting: Family Medicine

## 2021-04-27 ENCOUNTER — Encounter: Payer: Self-pay | Admitting: Oncology

## 2021-04-27 DIAGNOSIS — Z1231 Encounter for screening mammogram for malignant neoplasm of breast: Secondary | ICD-10-CM

## 2021-05-25 ENCOUNTER — Telehealth: Payer: Self-pay

## 2021-05-25 DIAGNOSIS — E039 Hypothyroidism, unspecified: Secondary | ICD-10-CM

## 2021-05-25 MED ORDER — SYNTHROID 112 MCG PO TABS: 112.0000 ug | ORAL_TABLET | Freq: Every day | ORAL | 2 refills | Status: DC

## 2021-05-25 NOTE — Telephone Encounter (Signed)
done

## 2021-05-25 NOTE — Telephone Encounter (Signed)
Received fax from [facility] for a refill on the pts. Synthroid brand only pt. Last apt was 12/31/20.

## 2021-07-16 NOTE — Patient Instructions (Signed)
Preventive Care 40-40 Years Old, Female Preventive care refers to lifestyle choices and visits with your health care provider that can promote health and wellness. This includes: A yearly physical exam. This is also called an annual wellness visit. Regular dental and eye exams. Immunizations. Screening for certain conditions. Healthy lifestyle choices, such as: Eating a healthy diet. Getting regular exercise. Not using drugs or products that contain nicotine and tobacco. Limiting alcohol use. What can I expect for my preventive care visit? Physical exam Your health care provider will check your: Height and weight. These may be used to calculate your BMI (body mass index). BMI is a measurement that tells if you are at a healthy weight. Heart rate and blood pressure. Body temperature. Skin for abnormal spots. Counseling Your health care provider may ask you questions about your: Past medical problems. Family's medical history. Alcohol, tobacco, and drug use. Emotional well-being. Home life and relationship well-being. Sexual activity. Diet, exercise, and sleep habits. Work and work environment. Access to firearms. Method of birth control. Menstrual cycle. Pregnancy history. What immunizations do I need? Vaccines are usually given at various ages, according to a schedule. Your health care provider will recommend vaccines for you based on your age, medical history, and lifestyle or other factors, such as travel or where you work. What tests do I need? Blood tests Lipid and cholesterol levels. These may be checked every 5 years, or more often if you are over 50 years old. Hepatitis C test. Hepatitis B test. Screening Lung cancer screening. You may have this screening every year starting at age 55 if you have a 30-pack-year history of smoking and currently smoke or have quit within the past 15 years. Colorectal cancer screening. All adults should have this screening starting at  age 50 and continuing until age 75. Your health care provider may recommend screening at age 45 if you are at increased risk. You will have tests every 1-10 years, depending on your results and the type of screening test. Diabetes screening. This is done by checking your blood sugar (glucose) after you have not eaten for a while (fasting). You may have this done every 1-3 years. Mammogram. This may be done every 1-2 years. Talk with your health care provider about when you should start having regular mammograms. This may depend on whether you have a family history of breast cancer. BRCA-related cancer screening. This may be done if you have a family history of breast, ovarian, tubal, or peritoneal cancers. Pelvic exam and Pap test. This may be done every 3 years starting at age 21. Starting at age 30, this may be done every 5 years if you have a Pap test in combination with an HPV test. Other tests STD (sexually transmitted disease) testing, if you are at risk. Bone density scan. This is done to screen for osteoporosis. You may have this scan if you are at high risk for osteoporosis. Talk with your health care provider about your test results, treatment options, and if necessary, the need for more tests. Follow these instructions at home: Eating and drinking  Eat a diet that includes fresh fruits and vegetables, whole grains, lean protein, and low-fat dairy products. Take vitamin and mineral supplements as recommended by your health care provider. Do not drink alcohol if: Your health care provider tells you not to drink. You are pregnant, may be pregnant, or are planning to become pregnant. If you drink alcohol: Limit how much you have to 0-1 drink a day. Be   aware of how much alcohol is in your drink. In the [place] one drink equals one 12 oz bottle of beer (355 mL), one 5 oz glass of wine (148 mL), or one 1 oz glass of hard liquor (44 mL). Lifestyle Take daily care of your teeth and  gums. Brush your teeth every morning and night with fluoride toothpaste. Floss one time each day. Stay active. Exercise for at least 30 minutes 5 or more days each week. Do not use any products that contain nicotine or tobacco, such as cigarettes, e-cigarettes, and chewing tobacco. If you need help quitting, ask your health care provider. Do not use drugs. If you are sexually active, practice safe sex. Use a condom or other form of protection to prevent STIs (sexually transmitted infections). If you do not wish to become pregnant, use a form of birth control. If you plan to become pregnant, see your health care provider for a prepregnancy visit. If told by your health care provider, take low-dose aspirin daily starting at age 63. Find healthy ways to cope with stress, such as: Meditation, yoga, or listening to music. Journaling. Talking to a trusted person. Spending time with friends and family. Safety Always wear your seat belt while driving or riding in a vehicle. Do not drive: If you have been drinking alcohol. Do not ride with someone who has been drinking. When you are tired or distracted. While texting. Wear a helmet and other protective equipment during sports activities. If you have firearms in your house, make sure you follow all gun safety procedures. What's next? Visit your health care provider once a year for an annual wellness visit. Ask your health care provider how often you should have your eyes and teeth checked. Stay up to date on all vaccines. This information is not intended to replace advice given to you by your health care provider. Make sure you discuss any questions you have with your health care provider. Document Revised: 12/12/2020 Document Reviewed: 06/15/2018 Elsevier Patient Education  2022 [employer].

## 2021-07-16 NOTE — Progress Notes (Signed)
Subjective:    Patient ID: Miranda Fowler, female    DOB: Feb 09, 1981, 40 y.o.   MRN: [id number]  HPI Chief Complaint  Patient presents with   Annual Exam   Vaginal Discharge    [place] for 2 days thinks has a yeast infection, requests full STD screening   She is here for a complete physical exam.  Other providers: None   Vaginal discharge x 2 days, no itching, pain, or odor. Requests STD testing   Reports bruising at times without explanation. Hx of anemia.   She also has chronic constipation which is unchanged.   Intermittent leg cramps at night and improves with hydration.   Taking Synthroid daily on empty stomach.   Social history: Lives with spouse, 4 children Denies smoking, drinking alcohol, drug use  Diet: eats whatever she wants  Excerise: daily, she walks   Immunizations: she had one Pfizer Covid vaccine and declines others. Declines flu   Health maintenance:   Mammogram:  04/2021 Colonoscopy: N/A Last Gynecological Exam: pap smear in 2017 Last Menstrual cycle: Partial hysterectomy  Last Dental Exam: August 2022  Last Eye Exam: 2 years   Wears seatbelt always, smoke detectors in home and functioning, does not text while driving and feels safe in home environment.   Reviewed allergies, medications, past medical, surgical, family, and social history.    Review of Systems Review of Systems Constitutional: -fever, -chills, -sweats, -unexpected weight change,-fatigue ENT: -runny nose, -ear pain, -sore throat Cardiology:  -chest pain, -palpitations, -edema Respiratory: -cough, -shortness of breath, -wheezing Gastroenterology: -abdominal pain, -nausea, -vomiting, -diarrhea, -constipation  Hematology: -bleeding or bruising problems Musculoskeletal: -arthralgias, -myalgias, -joint swelling, -back pain Ophthalmology: -vision changes Urology: -dysuria, -difficulty urinating, -hematuria, -urinary frequency, -urgency Neurology: -headache, -weakness,  -tingling, -numbness       Objective:   Physical Exam BP 104/72 (BP Location: Right Arm, Patient Position: Sitting)   Pulse 66   Ht 5\' 2"  (1.575 m)   Wt 165 lb 3.2 oz (74.9 kg)   LMP 11/07/2015   SpO2 99%   BMI 30.22 kg/m   General Appearance:    Alert, cooperative, no distress, appears stated age  Head:    Normocephalic, without obvious abnormality, atraumatic  Eyes:    PERRL, conjunctiva/corneas clear, EOM's intact  Ears:    Normal TM's and external ear canals  Nose:   Mask on   Throat:  Mask on  Neck:   Supple, no lymphadenopathy;  thyroid:  no   enlargement/tenderness/nodules; no JVD  Back:    Spine nontender, no curvature, ROM normal, no CVA     tenderness  Lungs:     Clear to auscultation bilaterally without wheezes, rales or     ronchi; respirations unlabored  Chest Wall:    No tenderness or deformity   Heart:    Regular rate and rhythm, S1 and S2 normal, no murmur, rub   or gallop  Breast Exam:    Not done   Abdomen:     Soft, non-tender, nondistended, normoactive bowel sounds,    no masses, no hepatosplenomegaly  Genitalia:    Normal external genitalia without lesions.  BUS and vagina normal. No abnormal vaginal discharge. adnexa not enlarged, nontender, no masses.  Pap performed. Chaperone present.      Extremities:   No clubbing, cyanosis or edema  Pulses:   2+ and symmetric all extremities  Skin:   Skin color, texture, turgor normal, no rashes or lesions  Lymph nodes:   Cervical, supraclavicular  nodes normal  Neurologic:   CNII-XII intact, normal strength, sensation and gait          Psych:   Normal mood, affect, hygiene and grooming.          Assessment & Plan:  Routine general medical examination at a health care facility - Plan: CBC with Differential/Platelet, Comprehensive metabolic panel, Lipid panel -Preventive health care reviewed.  Mammogram up-to-date.  Recommend regular dental and eye exams.  Counseling on healthy lifestyle including diet and  exercise.  Immunizations reviewed.  Discussed safety  Hypothyroidism, unspecified type - Plan: TSH, T4, free -Appears to be taking medication appropriately.  Follow-up pending results  Screening for cervical cancer - Plan: Cytology - PAP([place])  Screen for STD (sexually transmitted disease) - Plan: Hepatitis C antibody, HIV Antibody (routine testing w rflx), RPR, Cytology - PAP([place]) -Done per patient request  Vaginal discharge - Plan: Cytology - PAP([place]) -Follow-up pending results  Leg cramps - Plan: Vitamin B12 -Discussed good hydration since this works to resolve leg cramps per patient  Spontaneous bruising - Plan: CBC with Differential/Platelet, Vitamin B12, Iron, TIBC and Ferritin Panel, Folate -No obvious bruising today  History of anemia - Plan: CBC with Differential/Platelet, Vitamin B12, Iron, TIBC and Ferritin Panel, Folate  Screening for lipid disorders - Plan: Lipid panel  Obesity (BMI 30.0-34.9) - Plan: Lipid panel  Chronic constipation -Discussed MiraLAX and Benefiber use

## 2021-07-17 ENCOUNTER — Encounter: Payer: Self-pay | Admitting: Family Medicine

## 2021-07-17 ENCOUNTER — Other Ambulatory Visit: Payer: Self-pay

## 2021-07-17 ENCOUNTER — Ambulatory Visit (INDEPENDENT_AMBULATORY_CARE_PROVIDER_SITE_OTHER): Payer: 59 | Admitting: Family Medicine

## 2021-07-17 ENCOUNTER — Other Ambulatory Visit (HOSPITAL_COMMUNITY)
Admission: RE | Admit: 2021-07-17 | Discharge: 2021-07-17 | Disposition: A | Discharge status: Home / Self Care | Payer: 59 | Source: Ambulatory Visit | Attending: Family Medicine | Admitting: Family Medicine

## 2021-07-17 VITALS — BP 104/72 | HR 66 | Ht 62.0 in | Wt 165.2 lb

## 2021-07-17 DIAGNOSIS — Z113 Encounter for screening for infections with a predominantly sexual mode of transmission: Secondary | ICD-10-CM | POA: Diagnosis not present

## 2021-07-17 DIAGNOSIS — E039 Hypothyroidism, unspecified: Secondary | ICD-10-CM | POA: Diagnosis not present

## 2021-07-17 DIAGNOSIS — Z862 Personal history of diseases of the blood and blood-forming organs and certain disorders involving the immune mechanism: Secondary | ICD-10-CM | POA: Diagnosis not present

## 2021-07-17 DIAGNOSIS — Z124 Encounter for screening for malignant neoplasm of cervix: Secondary | ICD-10-CM | POA: Diagnosis not present

## 2021-07-17 DIAGNOSIS — R252 Cramp and spasm: Secondary | ICD-10-CM | POA: Diagnosis not present

## 2021-07-17 DIAGNOSIS — N898 Other specified noninflammatory disorders of vagina: Secondary | ICD-10-CM | POA: Diagnosis not present

## 2021-07-17 DIAGNOSIS — K5909 Other constipation: Secondary | ICD-10-CM

## 2021-07-17 DIAGNOSIS — Z13220 Encounter for screening for lipoid disorders: Secondary | ICD-10-CM

## 2021-07-17 DIAGNOSIS — Z0000 Encounter for general adult medical examination without abnormal findings: Secondary | ICD-10-CM

## 2021-07-17 DIAGNOSIS — R233 Spontaneous ecchymoses: Secondary | ICD-10-CM | POA: Diagnosis not present

## 2021-07-17 DIAGNOSIS — E669 Obesity, unspecified: Secondary | ICD-10-CM

## 2021-07-17 DIAGNOSIS — E66811 Obesity, class 1: Secondary | ICD-10-CM

## 2021-07-19 ENCOUNTER — Encounter: Payer: Self-pay | Admitting: Family Medicine

## 2021-07-20 ENCOUNTER — Encounter: Payer: Self-pay | Admitting: Family Medicine

## 2021-07-20 DIAGNOSIS — R87811 Vaginal high risk human papillomavirus (HPV) DNA test positive: Secondary | ICD-10-CM

## 2021-07-20 HISTORY — DX: Vaginal high risk human papillomavirus (HPV) DNA test positive: R87.811

## 2021-07-20 LAB — CBC WITH DIFFERENTIAL/PLATELET
Basophils Absolute: 0 10*3/uL (ref 0.0–0.2)
Basos: 1 %
EOS (ABSOLUTE): 0 10*3/uL (ref 0.0–0.4)
Eos: 0 %
Hematocrit: 33.9 % — ABNORMAL LOW (ref 34.0–46.6)
Hemoglobin: 11.5 g/dL (ref 11.1–15.9)
Immature Grans (Abs): 0 10*3/uL (ref 0.0–0.1)
Immature Granulocytes: 0 %
Lymphocytes Absolute: 1.2 10*3/uL (ref 0.7–3.1)
Lymphs: 41 %
MCH: 32.4 pg (ref 26.6–33.0)
MCHC: 33.9 g/dL (ref 31.5–35.7)
MCV: 96 fL (ref 79–97)
Monocytes Absolute: 0.4 10*3/uL (ref 0.1–0.9)
Monocytes: 13 %
Neutrophils Absolute: 1.4 10*3/uL (ref 1.4–7.0)
Neutrophils: 45 %
Platelets: 261 10*3/uL (ref 150–450)
RBC: 3.55 x10E6/uL — ABNORMAL LOW (ref 3.77–5.28)
RDW: 10.1 % — ABNORMAL LOW (ref 11.7–15.4)
WBC: 3 10*3/uL — ABNORMAL LOW (ref 3.4–10.8)

## 2021-07-20 LAB — COMPREHENSIVE METABOLIC PANEL
ALT: 13 IU/L (ref 0–32)
AST: 21 IU/L (ref 0–40)
Albumin/Globulin Ratio: 1.3 (ref 1.2–2.2)
Albumin: 4.1 g/dL (ref 3.8–4.8)
Alkaline Phosphatase: 72 IU/L (ref 44–121)
BUN/Creatinine Ratio: 9 (ref 9–23)
BUN: 7 mg/dL (ref 6–24)
Bilirubin Total: 0.4 mg/dL (ref 0.0–1.2)
CO2: 24 mmol/L (ref 20–29)
Calcium: 9.8 mg/dL (ref 8.7–10.2)
Chloride: 101 mmol/L (ref 96–106)
Creatinine, Ser: 0.79 mg/dL (ref 0.57–1.00)
Globulin, Total: 3.1 g/dL (ref 1.5–4.5)
Glucose: 91 mg/dL (ref 70–99)
Potassium: 4 mmol/L (ref 3.5–5.2)
Sodium: 138 mmol/L (ref 134–144)
Total Protein: 7.2 g/dL (ref 6.0–8.5)
eGFR: 97 mL/min/{1.73_m2} (ref >59)

## 2021-07-20 LAB — TSH: TSH: 19.3 u[IU]/mL — ABNORMAL HIGH (ref 0.450–4.500)

## 2021-07-20 LAB — LIPID PANEL
Chol/HDL Ratio: 2.3 ratio (ref 0.0–4.4)
Cholesterol, Total: 191 mg/dL (ref 100–199)
HDL: 83 mg/dL (ref >39)
LDL Chol Calc (NIH): 99 mg/dL (ref 0–99)
Triglycerides: 43 mg/dL (ref 0–149)
VLDL Cholesterol Cal: 9 mg/dL (ref 5–40)

## 2021-07-20 LAB — HEPATITIS C ANTIBODY: Hep C Virus Ab: <0.1 s/co ratio (ref 0.0–0.9)

## 2021-07-20 LAB — HIV ANTIBODY (ROUTINE TESTING W REFLEX): HIV Screen 4th Generation wRfx: NONREACTIVE

## 2021-07-20 LAB — VITAMIN B12: Vitamin B-12: 470 pg/mL (ref 232–1245)

## 2021-07-20 LAB — CYTOLOGY - PAP
Chlamydia: NEGATIVE
Comment: NEGATIVE
Comment: NEGATIVE
Comment: NEGATIVE
Comment: NORMAL
Diagnosis: NEGATIVE
High risk HPV: POSITIVE — AB
Neisseria Gonorrhea: NEGATIVE
Trichomonas: NEGATIVE

## 2021-07-20 LAB — RPR, QUANT+TP ABS (REFLEX)
Rapid Plasma Reagin, Quant: 1:1 {titer} — ABNORMAL HIGH
T Pallidum Abs: NONREACTIVE

## 2021-07-20 LAB — IRON,TIBC AND FERRITIN PANEL
Ferritin: 348 ng/mL — ABNORMAL HIGH (ref 15–150)
Iron Saturation: 31 % (ref 15–55)
Iron: 69 ug/dL (ref 27–159)
Total Iron Binding Capacity: 222 ug/dL — ABNORMAL LOW (ref 250–450)
UIBC: 153 ug/dL (ref 131–425)

## 2021-07-20 LAB — FOLATE: Folate: 10.6 ng/mL (ref >3.0)

## 2021-07-20 LAB — T4, FREE: Free T4: 0.61 ng/dL — ABNORMAL LOW (ref 0.82–1.77)

## 2021-07-20 LAB — RPR: RPR Ser Ql: REACTIVE — AB

## 2021-07-20 NOTE — Progress Notes (Signed)
Her pap smear is abnormal, positive for HPV which is the virus that has been linked to an increased risk of cervical cancer. She needs to follow up with an OB/GYN.  Also, please read my note to her regarding the reactive RPR which may be a false positive. I am still waiting on the additional lab test.

## 2021-07-21 ENCOUNTER — Other Ambulatory Visit: Payer: Self-pay | Admitting: Family Medicine

## 2021-07-21 MED ORDER — SYNTHROID 125 MCG PO TABS: 125.0000 ug | ORAL_TABLET | Freq: Every day | ORAL | 2 refills | Status: DC

## 2021-07-22 ENCOUNTER — Telehealth: Payer: Self-pay | Admitting: Family Medicine

## 2021-07-22 DIAGNOSIS — R87811 Vaginal high risk human papillomavirus (HPV) DNA test positive: Secondary | ICD-10-CM

## 2021-07-22 NOTE — Telephone Encounter (Signed)
Pt called and said she wants to get her OBGYN referral sent to [facility] on [address].

## 2021-07-22 NOTE — Telephone Encounter (Signed)
Referral ordered

## 2021-08-14 ENCOUNTER — Other Ambulatory Visit: Payer: Self-pay | Admitting: Obstetrics & Gynecology

## 2021-10-15 ENCOUNTER — Encounter: Payer: Self-pay | Admitting: Oncology

## 2021-11-23 ENCOUNTER — Ambulatory Visit: Payer: 59 | Admitting: Physician Assistant

## 2021-11-30 ENCOUNTER — Other Ambulatory Visit: Payer: Self-pay | Admitting: Physician Assistant

## 2021-11-30 ENCOUNTER — Telehealth: Payer: Self-pay | Admitting: Physician Assistant

## 2021-11-30 DIAGNOSIS — E039 Hypothyroidism, unspecified: Secondary | ICD-10-CM

## 2021-11-30 MED ORDER — SYNTHROID 125 MCG PO TABS: 125.0000 ug | ORAL_TABLET | Freq: Every day | ORAL | 0 refills | Status: DC

## 2021-11-30 NOTE — Telephone Encounter (Signed)
Done

## 2021-11-30 NOTE — Telephone Encounter (Signed)
Received Fax Refill Request from [facility] Synthroid 0.125MG  Quantity: 30

## 2021-12-08 ENCOUNTER — Telehealth: Payer: Self-pay | Admitting: Physician Assistant

## 2021-12-08 NOTE — Telephone Encounter (Signed)
Pt states she is switching pcp

## 2021-12-10 ENCOUNTER — Other Ambulatory Visit: Payer: Self-pay

## 2021-12-10 ENCOUNTER — Encounter: Payer: Self-pay | Admitting: Emergency Medicine

## 2021-12-10 ENCOUNTER — Ambulatory Visit
Admission: EM | Admit: 2021-12-10 | Discharge: 2021-12-10 | Disposition: A | Discharge status: Home / Self Care | Payer: Managed Care, Other (non HMO) | Attending: Physician Assistant | Admitting: Physician Assistant

## 2021-12-10 ENCOUNTER — Encounter: Payer: Self-pay | Admitting: Oncology

## 2021-12-10 DIAGNOSIS — J019 Acute sinusitis, unspecified: Secondary | ICD-10-CM | POA: Diagnosis not present

## 2021-12-10 MED ORDER — AMOXICILLIN-POT CLAVULANATE 875-125 MG PO TABS: 1.0000 | ORAL_TABLET | Freq: Two times a day (BID) | ORAL | 0 refills | Status: DC

## 2021-12-10 NOTE — ED Provider Notes (Signed)
[name]    CSN: [id number] Arrival date & time: 12/10/21  1513      History   Chief Complaint Chief Complaint  Patient presents with   Sinus Infection    HPI Miranda Fowler is a 41 y.o. female.   Patient here today for evaluation of sinus pressure and congestion that started 2 days ago.  She reports that she can "taste" the infection in her mucus.  She has had some temporal headache as well as pressure to her maxillary and nasal area.  She has not had any fever.  She has had some mild cough that predated sinus pressure and states cough is typically caused by a tickle in her throat.  She has taken multiple COVID test including one at [facility] that was negative.  The history is provided by the patient.   Past Medical History:  Diagnosis Date   Anemia    Chronic problem for patient.  Followed by [facility], receives IV iron transfusions secondary to inability to tolerate PO supplements.     Arthritis    bilateral knees   Headache    Migraines   Hypothyroidism    post-radiation at age 22   Shortness of breath dyspnea    Vaginal high risk HPV DNA test positive 07/20/2021    Patient Active Problem List   Diagnosis Date Noted   Vaginal high risk HPV DNA test positive 07/20/2021   Abnormal echocardiogram 08/30/2018   Anxiety 03/22/2018   Attention and concentration deficit 01/05/2018   Forgetfulness 01/05/2018   Hyperglycemia 10/28/2016   Routine general medical examination at a health care facility 10/28/2016   Daytime somnolence 07/08/2016   Snoring 07/08/2016   Chronic fatigue 07/08/2016   Knee pain, bilateral 07/08/2016   Influenza vaccination declined 07/08/2016   Postoperative state 04/06/2016   History of transfusion 02/09/2016   Foot swelling 01/08/2016   SOB (shortness of breath)    Anemia 11/07/2015   Right shoulder pain 04/01/2014   Screen for STD (sexually transmitted disease) 11/05/2013   Hypothyroidism 04/06/2010   Iron deficiency anemia  04/06/2010    Past Surgical History:  Procedure Laterality Date   BILATERAL SALPINGECTOMY Bilateral 2008   pt denies   [place] N/A 04/06/2016   Procedure: CYSTOSCOPY;  Surgeon: Terrance Mass, MD;  Location: [facility];  Service: Gynecology;  Laterality: N/A;   LAPAROSCOPIC ASSISTED VAGINAL HYSTERECTOMY N/A 04/06/2016   Procedure: LAPAROSCOPIC ASSISTED VAGINAL HYSTERECTOMY,  Bilateral Salpingectomy;  Surgeon: Terrance Mass, MD;  Location: [facility];  Service: Gynecology;  Laterality: N/A;   TUBAL LIGATION      OB History     Gravida  4   Para  2   Term  0   Preterm  2   AB  0   Living  4      SAB  0   IAB  0   Ectopic  0   Multiple  0   Live Births               Home Medications    Prior to Admission medications   Medication Sig Start Date End Date Taking? Authorizing Provider  amoxicillin-clavulanate (AUGMENTIN) 875-125 MG tablet Take 1 tablet by mouth every 12 (twelve) hours. 12/10/21  Yes Francene Finders, PA-C  SYNTHROID 125 MCG tablet Take 1 tablet (125 mcg total) by mouth daily before breakfast. 11/30/21  Yes Irene Pap PA-C    Family History Family History  Problem Relation Age of Onset   Diabetes Maternal Grandmother    Cancer Paternal Aunt        breast   Heart disease Paternal Grandmother    Anemia Mother    Lupus Mother    Sickle cell trait Mother    Hypertension Father    Sickle cell trait Sister    Diabetes Maternal Grandfather    Cancer Paternal Aunt        cervical   Colon cancer Paternal Uncle     Social History Social History   Tobacco Use   Smoking status: Never   Smokeless tobacco: Never  Vaping Use   Vaping Use: Never used  Substance Use Topics   Alcohol use: No    Alcohol/week: 0.0 standard drinks    Comment: wine   Drug use: No     Allergies   Patient has no known allergies.   Review of Systems Review of Systems  Constitutional:  Negative for chills and fever.  HENT:   Positive for congestion and sinus pressure. Negative for ear pain and sore throat.   Eyes:  Negative for discharge and redness.  Respiratory:  Positive for cough. Negative for shortness of breath and wheezing.   Gastrointestinal:  Negative for abdominal pain, diarrhea, nausea and vomiting.    Physical Exam Triage Vital Signs ED Triage Vitals  Enc Vitals Group     BP 12/10/21 1530 122/84     Pulse Rate 12/10/21 1530 61     Resp --      Temp 12/10/21 1530 98.8 F (37.1 C)     Temp Source 12/10/21 1530 Oral     SpO2 12/10/21 1530 98 %     Weight 12/10/21 1531 160 lb (72.6 kg)     Height 12/10/21 1531 5\' 3"  (1.6 m)     Head Circumference --      Peak Flow --      Pain Score 12/10/21 1531 5     Pain Loc --      Pain Edu? --      Excl. in [place]? --    No data found.  Updated Vital Signs BP 122/84 (BP Location: Left Arm)    Pulse 61    Temp 98.8 F (37.1 C) (Oral)    Ht 5\' 3"  (1.6 m)    Wt 160 lb (72.6 kg)    LMP 11/07/2015    SpO2 98%    BMI 28.34 kg/m   Visual Acuity Right Eye Distance:   Left Eye Distance:   Bilateral Distance:    Right Eye Near:   Left Eye Near:    Bilateral Near:     Physical Exam Vitals and nursing note reviewed.  Constitutional:      General: She is not in acute distress.    Appearance: Normal appearance. She is not ill-appearing.  HENT:     Head: Normocephalic and atraumatic.     Nose: Congestion present.     Mouth/Throat:     Mouth: Mucous membranes are moist.     Pharynx: No oropharyngeal exudate or posterior oropharyngeal erythema.  Eyes:     Conjunctiva/sclera: Conjunctivae normal.  Cardiovascular:     Rate and Rhythm: Normal rate and regular rhythm.     Heart sounds: Normal heart sounds. No murmur heard. Pulmonary:     Effort: Pulmonary effort is normal. No respiratory distress.     Breath sounds: Normal breath sounds. No wheezing, rhonchi or rales.  Skin:    General:  Skin is warm and dry.  Neurological:     Mental Status: She is  alert.  Psychiatric:        Mood and Affect: Mood normal.        Thought Content: Thought content normal.     UC Treatments / Results  Labs (all labs ordered are listed, but only abnormal results are displayed) Labs Reviewed - No data to display  EKG   Radiology No results found.  Procedures Procedures (including critical care time)  Medications Ordered in UC Medications - No data to display  Initial Impression / Assessment and Plan / UC Course  I have reviewed the triage vital signs and the nursing notes.  Pertinent labs & imaging results that were available during my care of the patient were reviewed by me and considered in my medical decision making (see chart for details).    We will treat to cover sinusitis with Augmentin.  Recommended follow-up if symptoms fail to improve or worsen.  Final Clinical Impressions(s) / UC Diagnoses   Final diagnoses:  Acute sinusitis, recurrence not specified, unspecified location   Discharge Instructions   None    ED Prescriptions     Medication Sig Dispense Auth. Provider   amoxicillin-clavulanate (AUGMENTIN) 875-125 MG tablet Take 1 tablet by mouth every 12 (twelve) hours. 14 tablet Francene Finders, PA-C      PDMP not reviewed this encounter.   Francene Finders, PA-C 12/10/21 1558

## 2021-12-10 NOTE — ED Triage Notes (Signed)
Patient c/o sinus pressure and congestion x 2 days, headache, cough, denies fever.  Patient has taken several COVID tests which were all negative.

## 2022-02-12 ENCOUNTER — Ambulatory Visit (INDEPENDENT_AMBULATORY_CARE_PROVIDER_SITE_OTHER): Payer: Managed Care, Other (non HMO) | Admitting: Nurse Practitioner

## 2022-02-12 VITALS — BP 128/80 | HR 62 | Temp 98.3°F | Ht 63.0 in | Wt 155.0 lb

## 2022-02-12 DIAGNOSIS — Z13220 Encounter for screening for lipoid disorders: Secondary | ICD-10-CM | POA: Insufficient documentation

## 2022-02-12 DIAGNOSIS — Z8659 Personal history of other mental and behavioral disorders: Secondary | ICD-10-CM | POA: Insufficient documentation

## 2022-02-12 DIAGNOSIS — E039 Hypothyroidism, unspecified: Secondary | ICD-10-CM

## 2022-02-12 DIAGNOSIS — Z8639 Personal history of other endocrine, nutritional and metabolic disease: Secondary | ICD-10-CM | POA: Diagnosis not present

## 2022-02-12 DIAGNOSIS — E663 Overweight: Secondary | ICD-10-CM | POA: Insufficient documentation

## 2022-02-12 DIAGNOSIS — R634 Abnormal weight loss: Secondary | ICD-10-CM

## 2022-02-12 DIAGNOSIS — Z131 Encounter for screening for diabetes mellitus: Secondary | ICD-10-CM | POA: Diagnosis not present

## 2022-02-12 HISTORY — DX: Personal history of other mental and behavioral disorders: Z86.59

## 2022-02-12 MED ORDER — SYNTHROID 125 MCG PO TABS: 125.0000 ug | ORAL_TABLET | Freq: Every day | ORAL | 0 refills | Status: DC

## 2022-02-12 NOTE — Assessment & Plan Note (Addendum)
Stable, seems to be improving per her report of starting to regain weight.  However will work-up further today.  Patient seems to feel this could be related to her constipation.  I recommended she continue to use over-the-counter MiraLAX as needed to produce bowel movement at least every 3 days. She is up-to-date with breast cancer and cervical cancer screening. We will start by collecting blood work today, may consider imaging pending these lab results.  She will follow-up in 6 weeks, however she was told if she continues to lose weight between now and then she should certainly call me at which point I may decide to see her sooner.  She reports her understanding. ?

## 2022-02-12 NOTE — Assessment & Plan Note (Signed)
Refilled Synthroid 125 mcg mouth daily prescribed.  Thyroid panel ordered as well for further evaluation.  Patient may be candidate of NP thyroid if symptoms remain suboptimally controlled on Synthroid. ?

## 2022-02-12 NOTE — Assessment & Plan Note (Signed)
We discussed possibly trialing Wellbutrin, but patient prefers not to do this at this time.  She denies feeling depressed.  PHQ-9 score is currently 12.  Recommended referral to [place] [facility] for formal evaluation and treatment of ADHD.  Referral ordered today. ?

## 2022-02-12 NOTE — Assessment & Plan Note (Signed)
Ordered CBC, further recommendations be made based upon these results. ?

## 2022-02-12 NOTE — Progress Notes (Signed)
? ? ? ?Subjective:  ?Patient ID: Miranda Fowler, female    DOB: 08-07-1981  Age: 41 y.o. MRN: [id number] ? ?CC:  ?Chief Complaint  ?Patient presents with  ? New Patient  ?  Thyroid concerns  ?  ? ? ?HPI  ?This patient arrives today for the above. ? ?Her previous provider left her previous practice.  So she is establishing here today.  ? ?She is concerned about unintentional weight loss.  She reports that over 2 months she has lost approximately 20 pounds.  Over the last few weeks she has noticed that her weight is starting to go up again.  She has a history of hypothyroidism secondary to radiation treatment of hyperthyroidism.  She has been off of her Synthroid for months.  She reports fatigue and constipation but denies any blood in her stool.  She denies any unexplained fevers.  She had screening mammogram completed in July 2022 which came back negative.  She reports that sometimes she will have early satiety which she attributes to constipation.  She tells me that she has a bowel movement maybe once a week.  She will take as needed MiraLAX, but tries to avoid this medication as she does not seem to tolerate it well.  She has history of abnormal Pap smear and reports that she underwent biopsy which she reports came back negative in September or October of 2022. ? ?She also mentions that she has a history of ADHD, and used to take Adderall but ended up stopping this.  She does not really like the side effects of Adderall but reports that she is having a hard time with attention and focus at work.  This is affecting her performance.  She would like to discuss possible treatment options for ADHD. ? ?She reports history of anemia secondary to heavy bleeding with her menstrual cycle.  She tells me that she has had a partial hysterectomy and is no longer having any vaginal bleeding. ? ?Past Medical History:  ?Diagnosis Date  ? Anemia   ? Chronic problem for patient.  Followed by Heme, receives IV iron transfusions  secondary to inability to tolerate PO supplements.    ? Arthritis   ? bilateral knees  ? Headache   ? Migraines  ? Hypothyroidism   ? post-radiation at age 41  ? Shortness of breath dyspnea   ? Vaginal high risk HPV DNA test positive 07/20/2021  ? ? ? ? ?Family History  ?Problem Relation Age of Onset  ? Diabetes Maternal Grandmother   ? Cancer Paternal Aunt   ?     breast  ? Heart disease Paternal Grandmother   ? Anemia Mother   ? Lupus Mother   ? Sickle cell trait Mother   ? Hypertension Father   ? Sickle cell trait Sister   ? Diabetes Maternal Grandfather   ? Cancer Paternal Aunt   ?     cervical  ? Colon cancer Paternal Uncle   ? ? ?Social History  ? ?Social History Narrative  ? Married, 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  10/2016  ? ?Social History  ? ?Tobacco Use  ? Smoking status: Never  ? Smokeless tobacco: Never  ?Substance Use Topics  ? Alcohol use: No  ?  Alcohol/week: 0.0 standard drinks  ?  Comment: wine  ? ? ? ?Current Meds  ?Medication Sig  ? [DISCONTINUED] SYNTHROID 125 MCG tablet Take 1 tablet (125 mcg total) by mouth daily before breakfast.  ? ? ?  ROS:  ?Review of Systems  ?Constitutional:  Positive for malaise/fatigue. Negative for fever and weight loss.  ?Respiratory:  Negative for shortness of breath.   ?Cardiovascular:  Negative for chest pain.  ?Gastrointestinal:  Positive for constipation. Negative for blood in stool.  ?Neurological:  Negative for seizures and loss of consciousness.  ?Psychiatric/Behavioral:  Negative for depression and suicidal ideas. The patient is nervous/anxious.   ? ? ?Objective:  ? ?Today's Vitals: BP 128/80 (BP Location: Right Arm, Patient Position: Sitting, Cuff Size: Large)   Pulse 62   Temp 98.3 ?F (36.8 ?C) (Oral)   Ht '5\' 3"'$  (1.6 m)   Wt 155 lb (70.3 kg)   LMP 11/07/2015   SpO2 98%   BMI 27.46 kg/m?  ? ?  02/12/2022  ?  3:13 PM 12/10/2021  ?  3:31 PM 12/10/2021  ?  3:30 PM  ?Vitals with BMI  ?Height '5\' 3"'$  '5\' 3"'$    ?Weight 155 lbs 160 lbs   ?BMI  27.46 28.35   ?Systolic 660  630  ?Diastolic 80  84  ?Pulse 62  61  ?  ? ?Physical Exam ?Vitals reviewed.  ?Constitutional:   ?   General: She is not in acute distress. ?   Appearance: Normal appearance.  ?HENT:  ?   Head: Normocephalic and atraumatic.  ?Neck:  ?   Vascular: No carotid bruit.  ?Cardiovascular:  ?   Rate and Rhythm: Normal rate and regular rhythm.  ?   Pulses: Normal pulses.  ?   Heart sounds: Normal heart sounds.  ?Pulmonary:  ?   Effort: Pulmonary effort is normal.  ?   Breath sounds: Normal breath sounds.  ?Skin: ?   General: Skin is warm and dry.  ?Neurological:  ?   General: No focal deficit present.  ?   Mental Status: She is alert and oriented to person, place, and time.  ?Psychiatric:     ?   Mood and Affect: Mood normal.     ?   Behavior: Behavior normal.     ?   Judgment: Judgment normal.  ? ? ? ? ? ?  02/12/2022  ?  3:07 PM 07/17/2021  ?  9:25 AM 12/03/2020  ?  9:31 AM  ?PHQ9 SCORE ONLY  ?PHQ-9 Total Score 12 0 9  ? ? ? ? ?Assessment and Plan  ? ?1. Hypothyroidism, unspecified type   ?2. History of iron deficiency   ?3. Overweight   ?4. Screening, lipid   ?5. Screening for diabetes mellitus   ?6. History of ADHD   ?7. Unintentional weight loss   ? ? ? ?Plan: ?See plan via problem list below. ? ? ?Tests ordered ?Orders Placed This Encounter  ?Procedures  ? TSH  ? Hemoglobin A1c  ? Lipid panel  ? Comprehensive metabolic panel  ? CBC  ? T4, free  ? Urinalysis with Culture, if indicated  ? T3, free  ? Ambulatory referral to Psychiatry  ? ? ? ? ?Meds ordered this encounter  ?Medications  ? SYNTHROID 125 MCG tablet  ?  Sig: Take 1 tablet (125 mcg total) by mouth daily before breakfast.  ?  Dispense:  90 tablet  ?  Refill:  0  ?  Last refill; needs office visit for lab check.  ? ? ?Patient to follow-up in 6 weeks, or sooner as needed. ? ?[name], NP ? ?

## 2022-02-12 NOTE — Assessment & Plan Note (Signed)
Ordered A1c today, further recommendations may be made based upon these results. ?

## 2022-02-12 NOTE — Assessment & Plan Note (Signed)
Lipid panel ordered today, further recommendations may be made based upon these results. ?

## 2022-02-19 ENCOUNTER — Other Ambulatory Visit (INDEPENDENT_AMBULATORY_CARE_PROVIDER_SITE_OTHER): Payer: Commercial Managed Care - HMO

## 2022-02-19 ENCOUNTER — Encounter: Payer: Self-pay | Admitting: Oncology

## 2022-02-19 DIAGNOSIS — Z8639 Personal history of other endocrine, nutritional and metabolic disease: Secondary | ICD-10-CM

## 2022-02-19 DIAGNOSIS — E039 Hypothyroidism, unspecified: Secondary | ICD-10-CM | POA: Diagnosis not present

## 2022-02-19 LAB — URINALYSIS WITH CULTURE, IF INDICATED
Bilirubin Urine: NEGATIVE
Ketones, ur: NEGATIVE
Leukocytes,Ua: NEGATIVE
Nitrite: NEGATIVE
Specific Gravity, Urine: 1.015 (ref 1.000–1.030)
Urine Glucose: NEGATIVE
Urobilinogen, UA: 1 (ref 0.0–1.0)
WBC, UA: NONE SEEN (ref 0–?)
pH: 6.5 (ref 5.0–8.0)

## 2022-02-19 LAB — COMPREHENSIVE METABOLIC PANEL
ALT: 40 U/L — ABNORMAL HIGH (ref 0–35)
AST: 34 U/L (ref 0–37)
Albumin: 4 g/dL (ref 3.5–5.2)
Alkaline Phosphatase: 60 U/L (ref 39–117)
BUN: 14 mg/dL (ref 6–23)
CO2: 29 mEq/L (ref 19–32)
Calcium: 9.3 mg/dL (ref 8.4–10.5)
Chloride: 100 mEq/L (ref 96–112)
Creatinine, Ser: 0.95 mg/dL (ref 0.40–1.20)
GFR: 74.53 mL/min (ref >60.00)
Glucose, Bld: 75 mg/dL (ref 70–99)
Potassium: 3.4 mEq/L — ABNORMAL LOW (ref 3.5–5.1)
Sodium: 136 mEq/L (ref 135–145)
Total Bilirubin: 0.7 mg/dL (ref 0.2–1.2)
Total Protein: 7.6 g/dL (ref 6.0–8.3)

## 2022-02-19 LAB — CBC
HCT: 35.3 % — ABNORMAL LOW (ref 36.0–46.0)
Hemoglobin: 12.2 g/dL (ref 12.0–15.0)
MCHC: 34.4 g/dL (ref 30.0–36.0)
MCV: 98.4 fl (ref 78.0–100.0)
Platelets: 250 10*3/uL (ref 150.0–400.0)
RBC: 3.59 Mil/uL — ABNORMAL LOW (ref 3.87–5.11)
RDW: 12.4 % (ref 11.5–15.5)
WBC: 3.2 10*3/uL — ABNORMAL LOW (ref 4.0–10.5)

## 2022-02-19 LAB — LIPID PANEL
Cholesterol: 240 mg/dL — ABNORMAL HIGH (ref 0–200)
HDL: 93.5 mg/dL (ref >39.00)
LDL Cholesterol: 137 mg/dL — ABNORMAL HIGH (ref 0–99)
NonHDL: 146.27
Total CHOL/HDL Ratio: 3
Triglycerides: 46 mg/dL (ref 0.0–149.0)
VLDL: 9.2 mg/dL (ref 0.0–40.0)

## 2022-02-19 LAB — TSH: TSH: 57.52 u[IU]/mL — ABNORMAL HIGH (ref 0.35–5.50)

## 2022-02-19 LAB — T4, FREE: Free T4: 0.21 ng/dL — ABNORMAL LOW (ref 0.60–1.60)

## 2022-02-19 LAB — HEMOGLOBIN A1C: Hgb A1c MFr Bld: 5.2 % (ref 4.6–6.5)

## 2022-03-24 ENCOUNTER — Ambulatory Visit
Admission: EM | Admit: 2022-03-24 | Discharge: 2022-03-24 | Disposition: A | Discharge status: Home / Self Care | Payer: Commercial Managed Care - HMO | Attending: Internal Medicine | Admitting: Internal Medicine

## 2022-03-24 DIAGNOSIS — J3489 Other specified disorders of nose and nasal sinuses: Secondary | ICD-10-CM

## 2022-03-24 MED ORDER — CETIRIZINE HCL 10 MG PO TABS: 10.0000 mg | ORAL_TABLET | Freq: Every day | ORAL | 0 refills | Status: DC

## 2022-03-24 MED ORDER — FLUTICASONE PROPIONATE 50 MCG/ACT NA SUSP: 1.0000 | Freq: Every day | NASAL | 0 refills | Status: DC

## 2022-03-24 NOTE — Discharge Instructions (Addendum)
It appears that your symptoms are related to a virus or allergies.  You have been sent medications that will help treat both.  They should resolve with these medications.  If symptoms persist or worsen please follow-up.  COVID test is pending.  We will call if it is positive.

## 2022-03-24 NOTE — ED Triage Notes (Signed)
Pt c/o nasal congestion onset yesterday

## 2022-03-24 NOTE — ED Provider Notes (Signed)
[name]    CSN: [id number] Arrival date & time: 03/24/22  1633      History   Chief Complaint Chief Complaint  Patient presents with   Nasal Congestion    HPI Miranda Fowler is a 41 y.o. female.   Patient presents with nasal congestion and nasal drainage that has been present since yesterday.  Patient reports that it is draining down the back of her throat, and when she spits it up she has noticed some blood-tinged drainage as well as some green discolored drainage. This is coming from her nose. Denies any associated cough, fever, ear pain, sore throat, nausea, vomiting, diarrhea, abdominal pain.  Patient has taken Tylenol for symptoms.    Past Medical History:  Diagnosis Date   Anemia    Chronic problem for patient.  Followed by Heme, receives IV iron transfusions secondary to inability to tolerate PO supplements.     Arthritis    bilateral knees   Headache    Migraines   Hypothyroidism    post-radiation at age 78   Shortness of breath dyspnea    Vaginal high risk HPV DNA test positive 07/20/2021    Patient Active Problem List   Diagnosis Date Noted   Screening, lipid 02/12/2022   Screening for diabetes mellitus 02/12/2022   History of ADHD 02/12/2022   Unintentional weight loss 02/12/2022   Vaginal high risk HPV DNA test positive 07/20/2021   Abnormal echocardiogram 08/30/2018   Anxiety 03/22/2018   Attention and concentration deficit 01/05/2018   Forgetfulness 01/05/2018   Hyperglycemia 10/28/2016   Routine general medical examination at a health care facility 10/28/2016   Daytime somnolence 07/08/2016   Snoring 07/08/2016   Chronic fatigue 07/08/2016   Knee pain, bilateral 07/08/2016   Influenza vaccination declined 07/08/2016   Postoperative state 04/06/2016   History of transfusion 02/09/2016   Foot swelling 01/08/2016   SOB (shortness of breath)    Anemia 11/07/2015   Right shoulder pain 04/01/2014   Screen for STD (sexually  transmitted disease) 11/05/2013   Hypothyroidism 04/06/2010   Iron deficiency anemia 04/06/2010    Past Surgical History:  Procedure Laterality Date   BILATERAL SALPINGECTOMY Bilateral 2008   pt denies   [place] N/A 04/06/2016   Procedure: CYSTOSCOPY;  Surgeon: Terrance Mass, MD;  Location: [facility];  Service: Gynecology;  Laterality: N/A;   LAPAROSCOPIC ASSISTED VAGINAL HYSTERECTOMY N/A 04/06/2016   Procedure: LAPAROSCOPIC ASSISTED VAGINAL HYSTERECTOMY,  Bilateral Salpingectomy;  Surgeon: Terrance Mass, MD;  Location: [facility];  Service: Gynecology;  Laterality: N/A;   TUBAL LIGATION      OB History     Gravida  4   Para  2   Term  0   Preterm  2   AB  0   Living  4      SAB  0   IAB  0   Ectopic  0   Multiple  0   Live Births               Home Medications    Prior to Admission medications   Medication Sig Start Date End Date Taking? Authorizing Provider  cetirizine (ZYRTEC) 10 MG tablet Take 1 tablet (10 mg total) by mouth daily. 03/24/22  Yes Gracie Gupta, Michele Rockers, FNP  fluticasone (FLONASE) 50 MCG/ACT nasal spray Place 1 spray into both nostrils daily for 3 days. 03/24/22 03/27/22 Yes Teodora Medici, FNP  SYNTHROID 125 MCG  tablet Take 1 tablet (125 mcg total) by mouth daily before breakfast. 02/12/22   [name], NP    Family History Family History  Problem Relation Age of Onset   Diabetes Maternal Grandmother    Cancer Paternal Aunt        breast   Heart disease Paternal Grandmother    Anemia Mother    Lupus Mother    Sickle cell trait Mother    Hypertension Father    Sickle cell trait Sister    Diabetes Maternal Grandfather    Cancer Paternal Aunt        cervical   Colon cancer Paternal Uncle     Social History Social History   Tobacco Use   Smoking status: Never   Smokeless tobacco: Never  Vaping Use   Vaping Use: Never used  Substance Use Topics   Alcohol use: No    Alcohol/week: 0.0 standard drinks     Comment: wine   Drug use: No     Allergies   Patient has no known allergies.   Review of Systems Review of Systems Per HPI  Physical Exam Triage Vital Signs ED Triage Vitals [03/24/22 1656]  Enc Vitals Group     BP 127/87     Pulse Rate 68     Resp 18     Temp 98 F (36.7 C)     Temp Source Oral     SpO2 98 %     Weight      Height      Head Circumference      Peak Flow      Pain Score 0     Pain Loc      Pain Edu?      Excl. in [place]?    No data found.  Updated Vital Signs BP 127/87 (BP Location: Left Arm)   Pulse 68   Temp 98 F (36.7 C) (Oral)   Resp 18   LMP 11/07/2015   SpO2 98%   Visual Acuity Right Eye Distance:   Left Eye Distance:   Bilateral Distance:    Right Eye Near:   Left Eye Near:    Bilateral Near:     Physical Exam Constitutional:      General: She is not in acute distress.    Appearance: Normal appearance. She is not toxic-appearing or diaphoretic.  HENT:     Head: Normocephalic and atraumatic.     Right Ear: Tympanic membrane and ear canal normal.     Left Ear: Tympanic membrane and ear canal normal.     Nose: Congestion present.     Mouth/Throat:     Mouth: Mucous membranes are moist.     Pharynx: No posterior oropharyngeal erythema.  Eyes:     Extraocular Movements: Extraocular movements intact.     Conjunctiva/sclera: Conjunctivae normal.     Pupils: Pupils are equal, round, and reactive to light.  Cardiovascular:     Rate and Rhythm: Normal rate and regular rhythm.     Pulses: Normal pulses.     Heart sounds: Normal heart sounds.  Pulmonary:     Effort: Pulmonary effort is normal. No respiratory distress.     Breath sounds: Normal breath sounds. No stridor. No wheezing, rhonchi or rales.  Abdominal:     General: Abdomen is flat. Bowel sounds are normal.     Palpations: Abdomen is soft.  Musculoskeletal:        General: Normal range of motion.  Cervical back: Normal range of motion.  Skin:    General: Skin is  warm and dry.  Neurological:     General: No focal deficit present.     Mental Status: She is alert and oriented to person, place, and time. Mental status is at baseline.  Psychiatric:        Mood and Affect: Mood normal.        Behavior: Behavior normal.        Thought Content: Thought content normal.        Judgment: Judgment normal.     UC Treatments / Results  Labs (all labs ordered are listed, but only abnormal results are displayed) Labs Reviewed  NOVEL CORONAVIRUS, NAA    EKG   Radiology No results found.  Procedures Procedures (including critical care time)  Medications Ordered in UC Medications - No data to display  Initial Impression / Assessment and Plan / UC Course  I have reviewed the triage vital signs and the nursing notes.  Pertinent labs & imaging results that were available during my care of the patient were reviewed by me and considered in my medical decision making (see chart for details).     Patient presents with symptoms likely from a viral upper respiratory infection. Differential includes bacterial pneumonia, sinusitis, allergic rhinitis, Covid 19, flu. Do not suspect underlying cardiopulmonary process. Symptoms seem unlikely related to ACS, CHF or COPD exacerbations, pneumonia, pneumothorax. Patient is nontoxic appearing and not in need of emergent medical intervention.  COVID test pending.  Recommended symptom control with over the counter medications.  Patient sent medications for symptoms.  Suspect blood-tinged nasal drainage is related to irritation of mucous membranes.  No concern for worrisome etiology at this time.  Do not think that chest imaging is necessary.  Return if symptoms fail to improve in 1-2 weeks or you develop shortness of breath, chest pain, severe headache. Patient states understanding and is agreeable.  Discharged with PCP followup.  Final Clinical Impressions(s) / UC Diagnoses   Final diagnoses:  Nasal drainage      Discharge Instructions      It appears that your symptoms are related to a virus or allergies.  You have been sent medications that will help treat both.  They should resolve with these medications.  If symptoms persist or worsen please follow-up.  COVID test is pending.  We will call if it is positive.    ED Prescriptions     Medication Sig Dispense Auth. Provider   cetirizine (ZYRTEC) 10 MG tablet Take 1 tablet (10 mg total) by mouth daily. 30 tablet [place], [place] E, [place]   fluticasone [facility]) 50 MCG/ACT nasal spray Place 1 spray into both nostrils daily for 3 days. 16 g Teodora Medici, [place]      PDMP not reviewed this encounter.   Teodora Medici, [place] 03/24/22 [phone]

## 2022-03-25 LAB — NOVEL CORONAVIRUS, NAA: SARS-CoV-2, NAA: NOT DETECTED

## 2022-03-26 ENCOUNTER — Ambulatory Visit: Payer: Commercial Managed Care - HMO | Admitting: Family Medicine

## 2022-03-27 ENCOUNTER — Emergency Department (HOSPITAL_COMMUNITY): Payer: Commercial Managed Care - HMO

## 2022-03-27 ENCOUNTER — Emergency Department (HOSPITAL_COMMUNITY)
Admission: EM | Admit: 2022-03-27 | Discharge: 2022-03-27 | Disposition: A | Discharge status: Home / Self Care | Payer: Commercial Managed Care - HMO | Attending: Emergency Medicine | Admitting: Emergency Medicine

## 2022-03-27 ENCOUNTER — Other Ambulatory Visit: Payer: Self-pay

## 2022-03-27 ENCOUNTER — Encounter (HOSPITAL_COMMUNITY): Payer: Self-pay | Admitting: Emergency Medicine

## 2022-03-27 DIAGNOSIS — M542 Cervicalgia: Secondary | ICD-10-CM | POA: Diagnosis present

## 2022-03-27 DIAGNOSIS — Z79899 Other long term (current) drug therapy: Secondary | ICD-10-CM | POA: Diagnosis not present

## 2022-03-27 DIAGNOSIS — R221 Localized swelling, mass and lump, neck: Secondary | ICD-10-CM | POA: Diagnosis not present

## 2022-03-27 DIAGNOSIS — E039 Hypothyroidism, unspecified: Secondary | ICD-10-CM | POA: Diagnosis not present

## 2022-03-27 LAB — CBC WITH DIFFERENTIAL/PLATELET
Abs Immature Granulocytes: 0.01 10*3/uL (ref 0.00–0.07)
Basophils Absolute: 0 10*3/uL (ref 0.0–0.1)
Basophils Relative: 0 %
Eosinophils Absolute: 0.1 10*3/uL (ref 0.0–0.5)
Eosinophils Relative: 2 %
HCT: 37.1 % (ref 36.0–46.0)
Hemoglobin: 12.3 g/dL (ref 12.0–15.0)
Immature Granulocytes: 0 %
Lymphocytes Relative: 31 %
Lymphs Abs: 1.7 10*3/uL (ref 0.7–4.0)
MCH: 33.5 pg (ref 26.0–34.0)
MCHC: 33.2 g/dL (ref 30.0–36.0)
MCV: 101.1 fL — ABNORMAL HIGH (ref 80.0–100.0)
Monocytes Absolute: 0.6 10*3/uL (ref 0.1–1.0)
Monocytes Relative: 10 %
Neutro Abs: 3.1 10*3/uL (ref 1.7–7.7)
Neutrophils Relative %: 57 %
Platelets: 258 10*3/uL (ref 150–400)
RBC: 3.67 MIL/uL — ABNORMAL LOW (ref 3.87–5.11)
RDW: 10.8 % — ABNORMAL LOW (ref 11.5–15.5)
WBC: 5.6 10*3/uL (ref 4.0–10.5)
nRBC: 0 % (ref 0.0–0.2)

## 2022-03-27 LAB — BASIC METABOLIC PANEL
Anion gap: 7 (ref 5–15)
BUN: 8 mg/dL (ref 6–20)
CO2: 27 mmol/L (ref 22–32)
Calcium: 9.6 mg/dL (ref 8.9–10.3)
Chloride: 105 mmol/L (ref 98–111)
Creatinine, Ser: 0.72 mg/dL (ref 0.44–1.00)
GFR, Estimated: >60 mL/min (ref >60)
Glucose, Bld: 87 mg/dL (ref 70–99)
Potassium: 3.7 mmol/L (ref 3.5–5.1)
Sodium: 139 mmol/L (ref 135–145)

## 2022-03-27 LAB — GROUP A STREP BY PCR: Group A Strep by PCR: NOT DETECTED

## 2022-03-27 MED ORDER — NAPROXEN 500 MG PO TABS: 500.0000 mg | ORAL_TABLET | Freq: Two times a day (BID) | ORAL | 0 refills | Status: DC | PRN

## 2022-03-27 MED ORDER — LIDOCAINE VISCOUS HCL 2 % MT SOLN
15.0000 mL | Freq: Once | OROMUCOSAL | Status: AC
  Administered 2022-03-27: 15 mL via OROMUCOSAL
  Filled 2022-03-27: qty 15

## 2022-03-27 MED ORDER — HYDROMORPHONE HCL 1 MG/ML IJ SOLN
1.0000 mg | Freq: Once | INTRAMUSCULAR | Status: AC
  Administered 2022-03-27: 1 mg via INTRAVENOUS
  Filled 2022-03-27: qty 1

## 2022-03-27 MED ORDER — IOHEXOL 300 MG/ML  SOLN
75.0000 mL | Freq: Once | INTRAMUSCULAR | Status: AC | PRN
  Administered 2022-03-27: 75 mL via INTRAVENOUS

## 2022-03-27 NOTE — ED Provider Notes (Signed)
[facility] EMERGENCY DEPARTMENT Provider Note   CSN: [id number] Arrival date & time: 03/27/22  2947     History  Chief Complaint  Patient presents with   Mass    Miranda Fowler is a 41 y.o. female.  Presented to ER due to concern for neck discomfort, neck pain, lump on throat.  Patient states that on the front of her neck she is having significant pain, pain is worse when swallowing.  At times makes it feels like she is having a hard time breathing.  She currently does not feel short of breath.  No chest pain, no fever or chills.  Has been able to swallow but does have some pain with swallowing.  She does endorse prior history of hypothyroidism and takes thyroid supplement.  She states that her TSH was checked a few weeks ago but preceding this she was not taking her Synthroid consistently.  Since that time she has been taking her Synthroid regularly.  HPI     Home Medications Prior to Admission medications   Medication Sig Start Date End Date Taking? Authorizing Provider  naproxen (NAPROSYN) 500 MG tablet Take 1 tablet (500 mg total) by mouth 2 (two) times daily as needed. 03/27/22  Yes Lucrezia Starch, MD  cetirizine (ZYRTEC) 10 MG tablet Take 1 tablet (10 mg total) by mouth daily. 03/24/22   Teodora Medici, FNP  fluticasone (FLONASE) 50 MCG/ACT nasal spray Place 1 spray into both nostrils daily for 3 days. 03/24/22 03/27/22  Teodora Medici, FNP  SYNTHROID 125 MCG tablet Take 1 tablet (125 mcg total) by mouth daily before breakfast. 02/12/22   [name], NP      Allergies    Patient has no known allergies.    Review of Systems   Review of Systems  Constitutional:  Negative for chills and fever.  HENT:  Positive for sore throat. Negative for ear pain.   Eyes:  Negative for pain and visual disturbance.  Respiratory:  Negative for cough and shortness of breath.   Cardiovascular:  Negative for chest pain and palpitations.  Gastrointestinal:  Negative for  abdominal pain and vomiting.  Genitourinary:  Negative for dysuria and hematuria.  Musculoskeletal:  Negative for arthralgias and back pain.  Skin:  Negative for color change and rash.  Neurological:  Negative for seizures and syncope.  All other systems reviewed and are negative.   Physical Exam Updated Vital Signs BP 134/87 (BP Location: Left Arm)   Pulse 60   Temp 98 F (36.7 C) (Oral)   Resp 14   LMP 11/07/2015   SpO2 100%  Physical Exam Vitals and nursing note reviewed.  Constitutional:      General: She is not in acute distress.    Appearance: She is well-developed.  HENT:     Head: Normocephalic and atraumatic.  Eyes:     Conjunctiva/sclera: Conjunctivae normal.  Cardiovascular:     Rate and Rhythm: Normal rate and regular rhythm.     Heart sounds: No murmur heard. Pulmonary:     Effort: Pulmonary effort is normal. No respiratory distress.     Breath sounds: Normal breath sounds.  Abdominal:     Palpations: Abdomen is soft.     Tenderness: There is no abdominal tenderness.  Musculoskeletal:        General: No swelling.     Cervical back: Neck supple.  Skin:    General: Skin is warm and dry.     Capillary  Refill: Capillary refill takes less than 2 seconds.  Neurological:     Mental Status: She is alert.  Psychiatric:        Mood and Affect: Mood normal.     ED Results / Procedures / Treatments   Labs (all labs ordered are listed, but only abnormal results are displayed) Labs Reviewed  CBC WITH DIFFERENTIAL/PLATELET - Abnormal; Notable for the following components:      Result Value   RBC 3.67 (*)    MCV 101.1 (*)    RDW 10.8 (*)    All other components within normal limits  GROUP A STREP BY PCR  BASIC METABOLIC PANEL    EKG None  Radiology CT Soft Tissue Neck W Contrast  Result Date: 03/27/2022 CLINICAL DATA:  Epiglottitis or tonsillitis suspected. Soft tissue swelling with infection suspected EXAM: CT NECK WITH CONTRAST TECHNIQUE:  Multidetector CT imaging of the neck was performed using the standard protocol following the bolus administration of intravenous contrast. RADIATION DOSE REDUCTION: This exam was performed according to the departmental dose-optimization program which includes automated exposure control, adjustment of the mA and/or kV according to patient size and/or use of iterative reconstruction technique. CONTRAST:  22m OMNIPAQUE IOHEXOL 300 MG/ML  SOLN COMPARISON:  None Available. FINDINGS: Pharynx and larynx: Normal. No mass or swelling. Salivary glands: No inflammation, mass, or stone. Thyroid: Atrophic and essentially non perceptible. No visible inflammation around the thyroid. Lymph nodes: None enlarged or abnormal density. Vascular: Negative. Limited intracranial: Negative. Visualized orbits: Negative Mastoids and visualized paranasal sinuses: Clear Skeleton: Benign ground-glass density areas in the bilateral mandibular body. No acute or aggressive finding. Upper chest: Clear apical lungs IMPRESSION: Benign neck CT.  No visible inflammation or soft tissue mass. Electronically Signed   By: JJorje GuildM.D.   On: 03/27/2022 11:47    Procedures Procedures    Medications Ordered in ED Medications  lidocaine (XYLOCAINE) 2 % viscous mouth solution 15 mL (15 mLs Mouth/Throat Given 03/27/22 0927)  iohexol (OMNIPAQUE) 300 MG/ML solution 75 mL (75 mLs Intravenous Contrast Given 03/27/22 1135)  HYDROmorphone (DILAUDID) injection 1 mg (1 mg Intravenous Given 03/27/22 1142)    ED Course/ Medical Decision Making/ A&P                           Medical Decision Making Amount and/or Complexity of Data Reviewed Labs: ordered. Radiology: ordered.  Risk Prescription drug management.   41year old presents to ER for neck discomfort, sore throat, sensation of neck swelling.  On physical exam neck appears normal, normal ROM, no palpable mass or deformity noted.  She does not appear to be in any respiratory distress and  has no stridor.  Given her symptoms without clear explanation, will check CT of neck to further investigate.  Basic labs reviewed, no acute findings.  CT scan independently reviewed and interpreted by myself.  No acute findings.  Given reassuring work-up, we will go ahead and discharge patient home.  I advised her to follow-up with her primary care doctor, given information for ENT referral if continued to have symptoms.  Reviewed return precautions if symptoms worsening.        Final Clinical Impression(s) / ED Diagnoses Final diagnoses:  Neck pain    Rx / DC Orders ED Discharge Orders          Ordered    naproxen (NAPROSYN) 500 MG tablet  2 times daily PRN        03/27/22  Minor, Willy Pinkerton S, MD 03/27/22 1258

## 2022-03-27 NOTE — ED Triage Notes (Signed)
Pt reported to ED with c/o "lump on throat", states that area to exterior neck near laryngeal prominence feels swollen and tender. States that she at times feels short of breath d/t area on throat.  Pt does endorse hx of thyroid problems and states she takes synthroid daily.

## 2022-03-27 NOTE — Discharge Instructions (Signed)
Recommend taking anti-inflammatory such as the prescribed naproxen as needed for your pain.  Please follow-up with primary care doctor.  I also would recommend following up with an ENT specialist if you are continuing to have this neck discomfort.  If at any point you are having difficulty breathing, difficulty swallowing, voice change or other new concerning symptom, please return to ER for reassessment.

## 2022-04-02 ENCOUNTER — Ambulatory Visit (INDEPENDENT_AMBULATORY_CARE_PROVIDER_SITE_OTHER): Payer: Commercial Managed Care - HMO

## 2022-04-02 ENCOUNTER — Ambulatory Visit (INDEPENDENT_AMBULATORY_CARE_PROVIDER_SITE_OTHER): Payer: Commercial Managed Care - HMO | Admitting: Nurse Practitioner

## 2022-04-02 ENCOUNTER — Encounter: Payer: Self-pay | Admitting: Nurse Practitioner

## 2022-04-02 VITALS — BP 140/76 | HR 60 | Temp 98.2°F | Ht 63.0 in | Wt 153.6 lb

## 2022-04-02 DIAGNOSIS — K5900 Constipation, unspecified: Secondary | ICD-10-CM | POA: Insufficient documentation

## 2022-04-02 DIAGNOSIS — D72819 Decreased white blood cell count, unspecified: Secondary | ICD-10-CM | POA: Diagnosis not present

## 2022-04-02 DIAGNOSIS — R634 Abnormal weight loss: Secondary | ICD-10-CM

## 2022-04-02 DIAGNOSIS — H1032 Unspecified acute conjunctivitis, left eye: Secondary | ICD-10-CM | POA: Diagnosis not present

## 2022-04-02 DIAGNOSIS — E039 Hypothyroidism, unspecified: Secondary | ICD-10-CM | POA: Diagnosis not present

## 2022-04-02 DIAGNOSIS — R051 Acute cough: Secondary | ICD-10-CM

## 2022-04-02 LAB — TSH: TSH: 0.53 u[IU]/mL (ref 0.35–5.50)

## 2022-04-02 LAB — CBC WITH DIFFERENTIAL/PLATELET
Basophils Absolute: 0 10*3/uL (ref 0.0–0.1)
Basophils Relative: 0.4 % (ref 0.0–3.0)
Eosinophils Absolute: 0 10*3/uL (ref 0.0–0.7)
Eosinophils Relative: 0.1 % (ref 0.0–5.0)
HCT: 32.4 % — ABNORMAL LOW (ref 36.0–46.0)
Hemoglobin: 10.7 g/dL — ABNORMAL LOW (ref 12.0–15.0)
Lymphocytes Relative: 37.7 % (ref 12.0–46.0)
Lymphs Abs: 1.6 10*3/uL (ref 0.7–4.0)
MCHC: 33 g/dL (ref 30.0–36.0)
MCV: 100.7 fl — ABNORMAL HIGH (ref 78.0–100.0)
Monocytes Absolute: 0.6 10*3/uL (ref 0.1–1.0)
Monocytes Relative: 14.2 % — ABNORMAL HIGH (ref 3.0–12.0)
Neutro Abs: 2 10*3/uL (ref 1.4–7.7)
Neutrophils Relative %: 47.6 % (ref 43.0–77.0)
Platelets: 235 10*3/uL (ref 150.0–400.0)
RBC: 3.21 Mil/uL — ABNORMAL LOW (ref 3.87–5.11)
RDW: 12.2 % (ref 11.5–15.5)
WBC: 4.1 10*3/uL (ref 4.0–10.5)

## 2022-04-02 LAB — SEDIMENTATION RATE: Sed Rate: 29 mm/hr — ABNORMAL HIGH (ref 0–20)

## 2022-04-02 LAB — C-REACTIVE PROTEIN: CRP: <1 mg/dL (ref 0.5–20.0)

## 2022-04-02 MED ORDER — BISACODYL 5 MG PO TBEC: 5.0000 mg | DELAYED_RELEASE_TABLET | Freq: Every day | ORAL | 2 refills | Status: DC | PRN

## 2022-04-02 MED ORDER — ALBUTEROL SULFATE HFA 108 (90 BASE) MCG/ACT IN AERS: 2.0000 | INHALATION_SPRAY | Freq: Four times a day (QID) | RESPIRATORY_TRACT | 0 refills | Status: DC | PRN

## 2022-04-02 MED ORDER — POLYMYXIN B-TRIMETHOPRIM 10000-0.1 UNIT/ML-% OP SOLN: 1.0000 [drp] | OPHTHALMIC | 0 refills | Status: DC

## 2022-04-02 NOTE — Assessment & Plan Note (Signed)
Etiology unclear, will check CBC with differential as well as peripheral smear for further evaluation.  Consider referral to hematology pending results.

## 2022-04-02 NOTE — Assessment & Plan Note (Signed)
Chronic, patient reports energy levels are improving.  Continue on Synthroid 125 mcg daily.  We will recheck TSH today to make sure thyroid is responding to the levothyroxine.  Further recommendations may be made based upon these results.

## 2022-04-02 NOTE — Progress Notes (Signed)
Established Patient Office Visit  Subjective   Patient ID: Miranda Fowler, female    DOB: 1981-10-10  Age: 41 y.o. MRN: [id number]  Chief Complaint  Patient presents with   Annual Exam    Patient arrives today for the above.  She is up-to-date with screening mammogram, she will be due for this again in about 1 month.  She is also up-to-date with Pap smear and will be due for this in November.   Unintentional weight loss: She continues to have unintentional weight loss.  At this point she has lost approximately 22 pounds in the last 3 months or so.  She tells me she has early satiety, and constipation.  She denies significant abdominal pain.  She has hypothyroidism and restarted her levothyroxine recently, she is due to have TSH rechecked today.  She feels that her energy levels are good.  She does have intermittent night sweats (she reports this is occurring less frequently), she has history of partial hysterectomy.  Blood work at last office visit was significant for mildly reduced GFR at 74, hyperlipidemia, leukopenia with white blood cell count of 3.2 and low red blood cell count of 3.5, hemoglobin normal at 12.2 and platelets normal at 250.  A1c 5.2.  She reports that she does worry at times but denies feeling significantly depressed or anxious to the point that it is affecting her ability to eat.  She does have family history of autoimmune disease (rheumatoid arthritis).  She also reports that she has been coughing and feeling some chest tightness with breathing.  She denies any history of asthma, however she used her child's albuterol inhaler with improvement in cough.  She reports that she has been tested for asthma in the past and was told she does not have this.  She denies seeing any visible blood in her stool.  She also reports that she is having green discharge to her left eye only in the morning.  She denies any pain to her eye.  I will be stuck shut with discharge upon  waking.      Review of Systems  Constitutional:  Positive for diaphoresis (occuring less often) and weight loss. Negative for chills, fever and malaise/fatigue.  HENT:  Positive for sore throat.   Eyes:  Negative for double vision.  Respiratory:  Positive for cough and shortness of breath. Negative for sputum production.   Cardiovascular:  Negative for chest pain and palpitations.  Gastrointestinal:  Positive for constipation.  Neurological:  Negative for dizziness and headaches.  Psychiatric/Behavioral:  Negative for depression and suicidal ideas. The patient is nervous/anxious.       Objective:     BP 140/76 (BP Location: Left Arm, Patient Position: Sitting, Cuff Size: Normal)   Pulse 60   Temp 98.2 F (36.8 C) (Oral)   Ht '5\' 3"'$  (1.6 m)   Wt 153 lb 9.6 oz (69.7 kg)   LMP 11/07/2015   SpO2 99%   BMI 27.21 kg/m  BP Readings from Last 3 Encounters:  04/02/22 140/76  03/27/22 126/87  03/24/22 127/87   Wt Readings from Last 3 Encounters:  04/02/22 153 lb 9.6 oz (69.7 kg)  02/12/22 155 lb (70.3 kg)  12/10/21 160 lb (72.6 kg)        04/02/2022    2:58 PM 02/12/2022    3:07 PM 07/17/2021    9:25 AM  PHQ9 SCORE ONLY  PHQ-9 Total Score 0 12 0     Physical Exam Vitals reviewed. Exam  conducted with a chaperone present.  Constitutional:      Appearance: Normal appearance.  HENT:     Head: Normocephalic and atraumatic.     Right Ear: Tympanic membrane, ear canal and external ear normal.     Left Ear: Tympanic membrane, ear canal and external ear normal.  Eyes:     General:        Right eye: No discharge.        Left eye: No discharge.     Extraocular Movements: Extraocular movements intact.     Conjunctiva/sclera: Conjunctivae normal.     Pupils: Pupils are equal, round, and reactive to light.  Neck:     Vascular: No carotid bruit.  Cardiovascular:     Rate and Rhythm: Normal rate and regular rhythm.     Pulses: Normal pulses.     Heart sounds: Normal heart  sounds. No murmur heard. Pulmonary:     Effort: Pulmonary effort is normal.     Breath sounds: Normal breath sounds.  Chest:  Breasts:    Breasts are symmetrical.     Right: Normal.     Left: Normal.     Comments: Glade Nurse as chaperone Abdominal:     General: Abdomen is flat. Bowel sounds are normal. There is no distension.     Palpations: Abdomen is soft. There is no mass.     Tenderness: There is no abdominal tenderness.  Musculoskeletal:        General: No tenderness.     Cervical back: Neck supple. No muscular tenderness.     Right lower leg: No edema.     Left lower leg: No edema.  Lymphadenopathy:     Cervical: No cervical adenopathy.     Upper Body:     Right upper body: No supraclavicular adenopathy.     Left upper body: No supraclavicular adenopathy.  Skin:    General: Skin is warm and dry.  Neurological:     General: No focal deficit present.     Mental Status: She is alert and oriented to person, place, and time.     Motor: No weakness.     Gait: Gait normal.  Psychiatric:        Mood and Affect: Mood normal.        Behavior: Behavior normal.        Judgment: Judgment normal.      No results found for any visits on 04/02/22.    The 10-year ASCVD risk score (Arnett DK, et al., 2019) is: 0.4%    Assessment & Plan:   Problem List Items Addressed This Visit       Endocrine   Hypothyroidism    Chronic, patient reports energy levels are improving.  Continue on Synthroid 125 mcg daily.  We will recheck TSH today to make sure thyroid is responding to the levothyroxine.  Further recommendations may be made based upon these results.      Relevant Orders   TSH   Antinuclear Antib (ANA)   Sedimentation rate   Cyclic citrul peptide antibody, IgG (QUEST)   Rheumatoid Factor   C-reactive protein     Other   Unintentional weight loss    Patient continues to lose weight unintentionally.  This point she is lost approximately 22 pounds over the last 3  months unintentionally.  Thus far no significant etiology determined.  Will order inflammatory markers for further evaluation, will also order CT scan of abdomen and pelvis for further evaluation.  Further  recommendations may be made based upon these results.  May consider referral to GI if all other testing is negative.      Relevant Orders   CT Abdomen Pelvis W Contrast   Antinuclear Antib (ANA)   Sedimentation rate   Cyclic citrul peptide antibody, IgG (QUEST)   Rheumatoid Factor   C-reactive protein   Constipation - Primary    Chronic, prescription for Dulcolax 5 mg daily as needed for bowel movement.  Goal is to have bowel movement at least once every 3 days.      Relevant Medications   bisacodyl 5 MG EC tablet   Other Relevant Orders   Antinuclear Antib (ANA)   Sedimentation rate   Cyclic citrul peptide antibody, IgG (QUEST)   Rheumatoid Factor   C-reactive protein   Acute cough    Acute, sounds consistent with possible allergic asthma.  For now we will treat symptomatically with as needed albuterol inhaler as she reports that this is helped her symptoms.  We will also get chest x-ray for further evaluation, further recommendations to be made based upon these results.      Relevant Medications   albuterol (VENTOLIN HFA) 108 (90 Base) MCG/ACT inhaler   Other Relevant Orders   DG Chest 2 View   Antinuclear Antib (ANA)   Sedimentation rate   Cyclic citrul peptide antibody, IgG (QUEST)   Rheumatoid Factor   C-reactive protein   Acute bacterial conjunctivitis of left eye    Consistent with possible bacterial conjunctivitis.  Will treat with trimethoprim polymyxin eyedrops, 1 drop every every 4 hours for 7 days.  Patient encouraged to notify me if symptoms persist or not improved.      Relevant Medications   trimethoprim-polymyxin b (POLYTRIM) ophthalmic solution   Other Relevant Orders   Antinuclear Antib (ANA)   Sedimentation rate   Cyclic citrul peptide antibody, IgG  (QUEST)   Rheumatoid Factor   C-reactive protein   Leukopenia    Etiology unclear, will check CBC with differential as well as peripheral smear for further evaluation.  Consider referral to hematology pending results.      Relevant Orders   CBC w/Diff   Pathologist smear review   Antinuclear Antib (ANA)   Sedimentation rate   Cyclic citrul peptide antibody, IgG (QUEST)   Rheumatoid Factor   C-reactive protein    Return in about 6 weeks (around 05/14/2022) for follow-up with Judson Roch.    [name], NP

## 2022-04-02 NOTE — Assessment & Plan Note (Signed)
Chronic, prescription for Dulcolax 5 mg daily as needed for bowel movement.  Goal is to have bowel movement at least once every 3 days.

## 2022-04-02 NOTE — Assessment & Plan Note (Signed)
Acute, sounds consistent with possible allergic asthma.  For now we will treat symptomatically with as needed albuterol inhaler as she reports that this is helped her symptoms.  We will also get chest x-ray for further evaluation, further recommendations to be made based upon these results.

## 2022-04-02 NOTE — Assessment & Plan Note (Signed)
Patient continues to lose weight unintentionally.  This point she is lost approximately 22 pounds over the last 3 months unintentionally.  Thus far no significant etiology determined.  Will order inflammatory markers for further evaluation, will also order CT scan of abdomen and pelvis for further evaluation.  Further recommendations may be made based upon these results.  May consider referral to GI if all other testing is negative.

## 2022-04-02 NOTE — Assessment & Plan Note (Signed)
Consistent with possible bacterial conjunctivitis.  Will treat with trimethoprim polymyxin eyedrops, 1 drop every every 4 hours for 7 days.  Patient encouraged to notify me if symptoms persist or not improved.

## 2022-04-02 NOTE — Patient Instructions (Addendum)
[place] [facility]: [phone] - call them to schedule an appointment

## 2022-04-05 LAB — ANA: Anti Nuclear Antibody (ANA): NEGATIVE

## 2022-04-05 LAB — EXTRA LAV TOP TUBE

## 2022-04-05 LAB — CYCLIC CITRUL PEPTIDE ANTIBODY, IGG: Cyclic Citrullin Peptide Ab: 20 UNITS — ABNORMAL HIGH

## 2022-04-05 LAB — RHEUMATOID FACTOR: Rheumatoid fact SerPl-aCnc: <14 IU/mL (ref <14)

## 2022-04-05 LAB — PATHOLOGIST SMEAR REVIEW

## 2022-04-06 ENCOUNTER — Telehealth: Payer: Self-pay | Admitting: Nurse Practitioner

## 2022-04-06 NOTE — Telephone Encounter (Signed)
Can you do a result note and I will call and advice the patient. Labs done 6/16

## 2022-04-06 NOTE — Telephone Encounter (Signed)
Pt is requesting a callback to go over her results. She said she read the results but does not quite understand them.   Please advise.

## 2022-04-08 NOTE — Telephone Encounter (Signed)
Patient informed of results.  

## 2022-04-13 ENCOUNTER — Ambulatory Visit
Admission: RE | Admit: 2022-04-13 | Discharge: 2022-04-13 | Disposition: A | Discharge status: Home / Self Care | Payer: Commercial Managed Care - HMO | Source: Ambulatory Visit | Attending: Nurse Practitioner | Admitting: Nurse Practitioner

## 2022-04-13 DIAGNOSIS — R634 Abnormal weight loss: Secondary | ICD-10-CM

## 2022-04-13 MED ORDER — IOPAMIDOL (ISOVUE-300) INJECTION 61%
100.0000 mL | Freq: Once | INTRAVENOUS | Status: AC | PRN
  Administered 2022-04-13: 100 mL via INTRAVENOUS

## 2022-04-30 ENCOUNTER — Other Ambulatory Visit: Payer: Self-pay | Admitting: Nurse Practitioner

## 2022-04-30 DIAGNOSIS — Z1231 Encounter for screening mammogram for malignant neoplasm of breast: Secondary | ICD-10-CM

## 2022-04-30 NOTE — Progress Notes (Signed)
Mammogram for breast cancer screening ordered

## 2022-05-21 ENCOUNTER — Ambulatory Visit: Payer: Commercial Managed Care - HMO | Admitting: Nurse Practitioner

## 2022-06-03 ENCOUNTER — Ambulatory Visit: Payer: Commercial Managed Care - HMO | Admitting: Nurse Practitioner

## 2022-08-05 ENCOUNTER — Ambulatory Visit (INDEPENDENT_AMBULATORY_CARE_PROVIDER_SITE_OTHER): Payer: Commercial Managed Care - HMO | Admitting: Nurse Practitioner

## 2022-08-05 VITALS — BP 128/84 | HR 64 | Temp 97.7°F | Ht 63.0 in | Wt 154.1 lb

## 2022-08-05 DIAGNOSIS — R634 Abnormal weight loss: Secondary | ICD-10-CM | POA: Diagnosis not present

## 2022-08-05 DIAGNOSIS — G4762 Sleep related leg cramps: Secondary | ICD-10-CM | POA: Insufficient documentation

## 2022-08-05 DIAGNOSIS — F909 Attention-deficit hyperactivity disorder, unspecified type: Secondary | ICD-10-CM | POA: Diagnosis not present

## 2022-08-05 DIAGNOSIS — K5900 Constipation, unspecified: Secondary | ICD-10-CM

## 2022-08-05 DIAGNOSIS — D649 Anemia, unspecified: Secondary | ICD-10-CM | POA: Diagnosis not present

## 2022-08-05 LAB — BASIC METABOLIC PANEL
BUN: 13 mg/dL (ref 6–23)
CO2: 31 mEq/L (ref 19–32)
Calcium: 9.8 mg/dL (ref 8.4–10.5)
Chloride: 104 mEq/L (ref 96–112)
Creatinine, Ser: 0.7 mg/dL (ref 0.40–1.20)
GFR: 107.18 mL/min (ref >60.00)
Glucose, Bld: 100 mg/dL — ABNORMAL HIGH (ref 70–99)
Potassium: 3.5 mEq/L (ref 3.5–5.1)
Sodium: 138 mEq/L (ref 135–145)

## 2022-08-05 LAB — CBC
HCT: 32.9 % — ABNORMAL LOW (ref 36.0–46.0)
Hemoglobin: 10.9 g/dL — ABNORMAL LOW (ref 12.0–15.0)
MCHC: 33 g/dL (ref 30.0–36.0)
MCV: 100.8 fl — ABNORMAL HIGH (ref 78.0–100.0)
Platelets: 226 10*3/uL (ref 150.0–400.0)
RBC: 3.27 Mil/uL — ABNORMAL LOW (ref 3.87–5.11)
RDW: 11.8 % (ref 11.5–15.5)
WBC: 3.7 10*3/uL — ABNORMAL LOW (ref 4.0–10.5)

## 2022-08-05 LAB — VITAMIN B12: Vitamin B-12: 323 pg/mL (ref 211–911)

## 2022-08-05 LAB — FOLATE: Folate: 13.9 ng/mL (ref >5.9)

## 2022-08-05 LAB — MAGNESIUM: Magnesium: 1.9 mg/dL (ref 1.5–2.5)

## 2022-08-05 LAB — FERRITIN: Ferritin: 183.8 ng/mL (ref 10.0–291.0)

## 2022-08-05 NOTE — Assessment & Plan Note (Signed)
Labs ordered for further evaluation for possible electrolyte abnormalities or vitamin deficiencies.  For now patient encouraged to hydrate regularly with water throughout the day with a goal of 80 to 100 ounces daily.  She was also encouraged to consider mild calf stretching before bed.  Further recommendations may be made based upon lab results.

## 2022-08-05 NOTE — Assessment & Plan Note (Signed)
Chronic, appears macrocytic.  Will order follow-up blood work for further evaluation, further recommendations may be made based upon these results.

## 2022-08-05 NOTE — Assessment & Plan Note (Signed)
Has currently stabilized, may be related to her constipation.  So far work-up has not shown any significant abnormalities, referral to GI made today for further evaluation of patient's unintentional weight loss, early satiety, constipation, and anemia.

## 2022-08-05 NOTE — Progress Notes (Signed)
Established Patient Office Visit  Subjective   Patient ID: Miranda Fowler, female    DOB: 12-04-1980  Age: 41 y.o. MRN: [id number]  Chief Complaint  Patient presents with   Weight Loss    Unintentional weight loss: Patient had been experiencing early satiety, and unintentional weight loss.  Today reports weight loss has stabilized and overall she is feeling well.  Previous evaluation suggests no significant anxiety or depression, she does have mild anemia, no longer menstruating due to partial hysterectomy.  Normal kidney function with normal electrolytes, mildly elevated sed rate at 29, she does have hypothyroidism last TSH 0.53 she does continue on levothyroxine.  Normal glucose, negative rheumatoid factor, negative ANA, CCP weakly positive.  CT scan of abdomen and pelvis performed which showed evidence of constipation but otherwise was within normal limits.  Nocturnal leg cramps: Reports early morning leg cramps, avoid stretching due to this.  Anemia: Last hemoglobin 10.7, macrocytic with MCV of 100.7.  Denies visible blood in stool.  ADHD: Reports having history of being on Adderall.  She is requesting medication to be restarted as she experiences difficulty with focus and concentration at work.  She feels like she is easily distracted and takes longer for her to get tasks done.  Per chart review I do see that previous primary provider Dr. Glade Lloyd was prescribing her Adderall 10 mg tablet by mouth twice a day.  She reports that she felt much more capable of focusing on work when she was on this medication.  She reports when she stopped seeing Dr. Glade Lloyd she was getting medication from psychiatry via [facility].  I do see evidence of follow-up with [facility] routinely ([facility] [place]).    Review of Systems  Constitutional:  Negative for malaise/fatigue.  HENT:         (+) hair loss  Respiratory:  Negative for shortness of breath.   Cardiovascular:  Negative for chest pain  and palpitations.  Gastrointestinal:  Positive for constipation. Negative for abdominal pain and blood in stool.  Neurological:  Positive for headaches. Negative for dizziness.  Psychiatric/Behavioral:  The patient does not have insomnia.       Objective:     BP 128/84   Pulse 64   Temp 97.7 F (36.5 C) (Oral)   Ht '5\' 3"'$  (1.6 m)   Wt 154 lb 2 oz (69.9 kg)   LMP 11/07/2015   SpO2 99%   BMI 27.30 kg/m  BP Readings from Last 3 Encounters:  08/05/22 128/84  04/02/22 140/76  03/27/22 126/87   Wt Readings from Last 3 Encounters:  08/05/22 154 lb 2 oz (69.9 kg)  04/02/22 153 lb 9.6 oz (69.7 kg)  02/12/22 155 lb (70.3 kg)      Physical Exam Vitals reviewed.  Constitutional:      General: She is not in acute distress.    Appearance: Normal appearance.  HENT:     Head: Normocephalic and atraumatic.  Neck:     Vascular: No carotid bruit.  Cardiovascular:     Rate and Rhythm: Normal rate and regular rhythm.     Pulses: Normal pulses.     Heart sounds: Normal heart sounds.  Pulmonary:     Effort: Pulmonary effort is normal.     Breath sounds: Normal breath sounds.  Skin:    General: Skin is warm and dry.  Neurological:     General: No focal deficit present.     Mental Status: She is alert and oriented to  person, place, and time.  Psychiatric:        Mood and Affect: Mood normal.        Behavior: Behavior normal.        Judgment: Judgment normal.      No results found for any visits on 08/05/22.    The 10-year ASCVD risk score (Arnett DK, et al., 2019) is: 0.2%    Assessment & Plan:   Problem List Items Addressed This Visit       Other   Anemia    Chronic, appears macrocytic.  Will order follow-up blood work for further evaluation, further recommendations may be made based upon these results.      Relevant Orders   Vitamin B12   Folate   Iron and TIBC   Ferritin   CBC   Unintentional weight loss - Primary    Has currently stabilized, may be  related to her constipation.  So far work-up has not shown any significant abnormalities, referral to GI made today for further evaluation of patient's unintentional weight loss, early satiety, constipation, and anemia.      Relevant Orders   Ambulatory referral to Gastroenterology   Vitamin B12   Folate   Iron and TIBC   Ferritin   CBC   Constipation    Chronic, patient to continue bisacodyl as needed.  Referral to gastroenterology made for further assistance with managing.      Relevant Orders   Ambulatory referral to Gastroenterology   Vitamin B12   Folate   Iron and TIBC   Ferritin   CBC   Nocturnal leg cramps    Labs ordered for further evaluation for possible electrolyte abnormalities or vitamin deficiencies.  For now patient encouraged to hydrate regularly with water throughout the day with a goal of 80 to 100 ounces daily.  She was also encouraged to consider mild calf stretching before bed.  Further recommendations may be made based upon lab results.      Relevant Orders   Basic metabolic panel   Magnesium   Attention deficit hyperactivity disorder (ADHD)    I briefly reviewed previous records, I do not see formal evaluation documented for diagnosis of ADHD, however I am unable to load all of patient's previous psychiatry notes.  Will request previous records, if patient has had formal evaluation and diagnosis of ADHD we will consider restarting Adderall 10 mg a mouth twice a day.  EKG reviewed and compared to previous EKG by myself as well as backup supervising physician.  No significant changes noted, she does have dome-shaped ST segment, but is currently asymptomatic and without chest pain.  Per backup supervising physician recommendation will not recommend evaluation with cardiology prior to prescribing Adderall.      Relevant Orders   EKG 12-Lead    Return in about 3 months (around 11/05/2022) for CPE with Jaclyn Andy.  I spent greater than 40 minutes on patient's visit  today including face-to-face contact with patient, record review, consultation with backup supervising physician, and discussing treatment plan with patient.   [name], NP

## 2022-08-05 NOTE — Assessment & Plan Note (Signed)
I briefly reviewed previous records, I do not see formal evaluation documented for diagnosis of ADHD, however I am unable to load all of patient's previous psychiatry notes.  Will request previous records, if patient has had formal evaluation and diagnosis of ADHD we will consider restarting Adderall 10 mg a mouth twice a day.  EKG reviewed and compared to previous EKG by myself as well as backup supervising physician.  No significant changes noted, she does have dome-shaped ST segment, but is currently asymptomatic and without chest pain.  Per backup supervising physician recommendation will not recommend evaluation with cardiology prior to prescribing Adderall.

## 2022-08-05 NOTE — Assessment & Plan Note (Addendum)
Chronic, patient to continue bisacodyl as needed.  Referral to gastroenterology made for further assistance with managing.

## 2022-08-06 ENCOUNTER — Other Ambulatory Visit: Payer: Commercial Managed Care - HMO

## 2022-08-06 ENCOUNTER — Other Ambulatory Visit: Payer: Self-pay | Admitting: Nurse Practitioner

## 2022-08-06 DIAGNOSIS — D649 Anemia, unspecified: Secondary | ICD-10-CM

## 2022-08-06 LAB — IRON AND TIBC
Iron Saturation: 15 % (ref 15–55)
Iron: 33 ug/dL (ref 27–159)
Total Iron Binding Capacity: 218 ug/dL — ABNORMAL LOW (ref 250–450)
UIBC: 185 ug/dL (ref 131–425)

## 2022-08-11 LAB — HGB FRACTIONATION CASCADE
Hgb A2: 2.8 % (ref 1.8–3.2)
Hgb A: 97.2 % (ref 96.4–98.8)
Hgb F: 0 % (ref 0.0–2.0)
Hgb S: 0 %

## 2022-08-12 ENCOUNTER — Other Ambulatory Visit: Payer: Self-pay | Admitting: Nurse Practitioner

## 2022-08-12 DIAGNOSIS — D649 Anemia, unspecified: Secondary | ICD-10-CM

## 2022-08-13 ENCOUNTER — Telehealth: Payer: Self-pay | Admitting: Oncology

## 2022-08-13 NOTE — Telephone Encounter (Signed)
Scheduled appt per 10/26 referral. Pt is aware of appt date and time. Pt is aware to arrive 15 mins prior to appt time and to bring and updated insurance card. Pt is aware of appt location.   

## 2022-08-20 ENCOUNTER — Telehealth: Payer: Self-pay | Admitting: Nurse Practitioner

## 2022-08-20 ENCOUNTER — Other Ambulatory Visit: Payer: Self-pay

## 2022-08-20 ENCOUNTER — Emergency Department (HOSPITAL_COMMUNITY)
Admission: EM | Admit: 2022-08-20 | Discharge: 2022-08-20 | Disposition: A | Discharge status: Home / Self Care | Payer: Commercial Managed Care - HMO | Attending: Emergency Medicine | Admitting: Emergency Medicine

## 2022-08-20 ENCOUNTER — Encounter (HOSPITAL_COMMUNITY): Payer: Self-pay

## 2022-08-20 ENCOUNTER — Other Ambulatory Visit: Payer: Self-pay | Admitting: Nurse Practitioner

## 2022-08-20 ENCOUNTER — Emergency Department (HOSPITAL_COMMUNITY): Payer: Commercial Managed Care - HMO

## 2022-08-20 ENCOUNTER — Encounter: Payer: Self-pay | Admitting: Nurse Practitioner

## 2022-08-20 DIAGNOSIS — W228XXA Striking against or struck by other objects, initial encounter: Secondary | ICD-10-CM | POA: Diagnosis not present

## 2022-08-20 DIAGNOSIS — Y92210 Daycare center as the place of occurrence of the external cause: Secondary | ICD-10-CM | POA: Insufficient documentation

## 2022-08-20 DIAGNOSIS — S0990XA Unspecified injury of head, initial encounter: Secondary | ICD-10-CM

## 2022-08-20 DIAGNOSIS — Z124 Encounter for screening for malignant neoplasm of cervix: Secondary | ICD-10-CM

## 2022-08-20 DIAGNOSIS — Y9389 Activity, other specified: Secondary | ICD-10-CM | POA: Diagnosis not present

## 2022-08-20 DIAGNOSIS — F902 Attention-deficit hyperactivity disorder, combined type: Secondary | ICD-10-CM

## 2022-08-20 DIAGNOSIS — S060XAA Concussion with loss of consciousness status unknown, initial encounter: Secondary | ICD-10-CM | POA: Diagnosis not present

## 2022-08-20 LAB — POC URINE PREG, ED: Preg Test, Ur: NEGATIVE

## 2022-08-20 MED ORDER — TETRACAINE HCL 0.5 % OP SOLN
2.0000 [drp] | Freq: Once | OPHTHALMIC | Status: DC
  Filled 2022-08-20: qty 2
  Filled 2022-08-20: qty 4

## 2022-08-20 MED ORDER — ONDANSETRON 8 MG PO TBDP: 8.0000 mg | ORAL_TABLET | Freq: Three times a day (TID) | ORAL | 0 refills | Status: DC | PRN

## 2022-08-20 MED ORDER — FLUORESCEIN SODIUM 1 MG OP STRP
1.0000 | ORAL_STRIP | Freq: Once | OPHTHALMIC | Status: DC
  Filled 2022-08-20 (×2): qty 1

## 2022-08-20 MED ORDER — AMPHETAMINE-DEXTROAMPHETAMINE 5 MG PO TABS: ORAL_TABLET | ORAL | 0 refills | Status: DC

## 2022-08-20 MED ORDER — HYDROCODONE-ACETAMINOPHEN 5-325 MG PO TABS
1.0000 | ORAL_TABLET | Freq: Once | ORAL | Status: AC
  Administered 2022-08-20: 1 via ORAL
  Filled 2022-08-20: qty 1

## 2022-08-20 NOTE — Telephone Encounter (Signed)
Please call patient and schedule her to follow-up in 1 month to see how she is tolerating the new medication.

## 2022-08-20 NOTE — ED Provider Triage Note (Signed)
Emergency Medicine Provider Triage Evaluation Note  Miranda Fowler , a 41 y.o. female  was evaluated in triage.  Pt complains of severe L sided headache after falling an hitting head on playground equipment to day. Reports swelling of left side of face. Reports left eye blurring and nausea. No vomiting. No weakness on one side of body, difficulty with speech. Reports short loss of consciousness.  Review of Systems  Positive: nausea, headache  Negative: vomiting  Physical Exam  Ht '5\' 4"'$  (1.626 m)   Wt 70.3 kg   LMP 11/07/2015   BMI 26.61 kg/m  Gen:   Awake, no distress   Resp:  Normal effort  MSK:   Moves extremities without difficulty  Other:  Extraocular movements intact, ecchymoses of L temple  Medical Decision Making  Medically screening exam initiated at 3:01 PM.  Appropriate orders placed.  Raeleigh R Diclemente was informed that the remainder of the evaluation will be completed by another provider, this initial triage assessment does not replace that evaluation, and the importance of remaining in the ED until their evaluation is complete.    Osvaldo Shipper, [place] 08/20/22 1506

## 2022-08-20 NOTE — ED Triage Notes (Signed)
Pt ran into a sign on the playground at work around 10am this morning. Pt states she has had a headache ever since and is endorsing nausea.

## 2022-08-20 NOTE — ED Provider Notes (Signed)
[facility] EMERGENCY DEPARTMENT Provider Note   CSN: [id number] Arrival date & time: 08/20/22  1355     History  Chief Complaint  Patient presents with   Head Injury    Miranda Fowler is a 41 y.o. female.   Head Injury    Patient presents due to head injury.  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  She hit the right side of her face against a piece of playground equipment, she did lose consciousness for less than a minute.  She is having pain to the right side of her face, initially had some vision changes such as loss of vision but that is fully resolved.  No double vision, diplopia, neck pain.  Not on blood thinners.  She does have a headache and some nausea but no vomiting.  Endorses integrated nausea after the head injury.  Home Medications Prior to Admission medications   Medication Sig Start Date End Date Taking? Authorizing Provider  ondansetron (ZOFRAN-ODT) 8 MG disintegrating tablet Take 1 tablet (8 mg total) by mouth every 8 (eight) hours as needed for nausea or vomiting. 08/20/22  Yes Sherrill Raring, PA-C  albuterol (VENTOLIN HFA) 108 (90 Base) MCG/ACT inhaler Inhale 2 puffs into the lungs every 6 (six) hours as needed for wheezing or shortness of breath. 04/02/22   [name], NP  amphetamine-dextroamphetamine (ADDERALL) 5 MG tablet Take 1 tablet by mouth daily as needed for attention and concentration 08/20/22   [name], NP  bisacodyl 5 MG EC tablet Take 1 tablet (5 mg total) by mouth daily as needed for moderate constipation. 04/02/22   [name], NP  cetirizine (ZYRTEC) 10 MG tablet Take 1 tablet (10 mg total) by mouth daily. 03/24/22   Teodora Medici, FNP  fluticasone (FLONASE) 50 MCG/ACT nasal spray Place 1 spray into both nostrils daily for 3 days. 03/24/22 03/27/22  Teodora Medici, FNP  naproxen (NAPROSYN) 500 MG tablet Take 1 tablet (500 mg total) by mouth 2 (two) times daily as needed. 03/27/22   Lucrezia Starch, MD  SYNTHROID 125 MCG tablet Take 1 tablet (125 mcg total) by mouth daily before breakfast. 02/12/22   [name], NP  trimethoprim-polymyxin b (POLYTRIM) ophthalmic solution Place 1 drop into the left eye every 4 (four) hours. 04/02/22   [name], NP      Allergies    Patient has no known allergies.    Review of Systems   Review of Systems  Physical Exam Updated Vital Signs BP 132/72 (BP Location: Right Arm)   Pulse 76   Temp 98.4 F (36.9 C) (Oral)   Resp 18   Ht '5\' 4"'$  (1.626 m)   Wt 70.3 kg   LMP 11/07/2015   SpO2 99%   BMI 26.61 kg/m  Physical Exam Vitals and nursing note reviewed. Exam conducted with a chaperone present.  Constitutional:      Appearance: Normal appearance.  HENT:     Head: Normocephalic and atraumatic.     Comments: No battle sign, periorbital ecchymosis, malocclusion, septal hematoma Eyes:     General: No scleral icterus.       Right eye: No discharge.        Left eye: No discharge.     Extraocular Movements: Extraocular movements intact.     Pupils: Pupils are equal, round, and reactive to light.  Cardiovascular:     Rate and Rhythm: Normal rate  and regular rhythm.     Pulses: Normal pulses.     Heart sounds: Normal heart sounds. No murmur heard.    No friction rub. No gallop.  Pulmonary:     Effort: Pulmonary effort is normal. No respiratory distress.     Breath sounds: Normal breath sounds.  Abdominal:     General: Abdomen is flat. Bowel sounds are normal. There is no distension.     Palpations: Abdomen is soft.     Tenderness: There is no abdominal tenderness.  Musculoskeletal:        General: Tenderness present.     Cervical back: Normal range of motion. No tenderness.     Comments: Tenderness to maxillary right.  Skin:    General: Skin is warm and dry.     Coloration: Skin is not jaundiced.  Neurological:     Mental Status: She is alert. Mental status is at baseline.     Coordination: Coordination normal.      ED Results / Procedures / Treatments   Labs (all labs ordered are listed, but only abnormal results are displayed) Labs Reviewed  POC URINE PREG, ED    EKG None  Radiology CT Head Wo Contrast  Result Date: 08/20/2022 CLINICAL DATA:  Patient ran into a sign on the playground at work this morning, headache. EXAM: CT HEAD WITHOUT CONTRAST CT MAXILLOFACIAL WITHOUT CONTRAST TECHNIQUE: Multidetector CT imaging of the head and maxillofacial structures were performed using the standard protocol without intravenous contrast. Multiplanar CT image reconstructions of the maxillofacial structures were also generated. RADIATION DOSE REDUCTION: This exam was performed according to the departmental dose-optimization program which includes automated exposure control, adjustment of the mA and/or kV according to patient size and/or use of iterative reconstruction technique. COMPARISON:  CT head 12/29/2006 FINDINGS: CT HEAD FINDINGS Brain: There is no acute intracranial hemorrhage, extra-axial fluid collection, or acute infarct. Parenchymal volume is normal. The ventricles are normal in size. Gray-white differentiation is preserved. A partially empty sella is noted. There is no mass lesion.  There is no mass effect or midline shift. Vascular: No hyperdense vessel or unexpected calcification. Skull: Normal. Negative for fracture or focal lesion. Other: None. CT MAXILLOFACIAL FINDINGS Osseous: There is no acute facial bone fracture. There is no evidence of mandibular dislocation. There is no suspicious osseous lesion. Orbits: The globes and orbits are unremarkable. There is no retrobulbar hematoma. Sinuses: Clear. Soft tissues: Unremarkable. IMPRESSION: 1. Normal noncontrast head CT. 2. No acute facial bone fracture. Electronically Signed   By: Valetta Mole M.D.   On: 08/20/2022 16:33   CT MAXILLOFACIAL WO CONTRAST  Result Date: 08/20/2022 CLINICAL DATA:  Patient ran into a sign on the playground at work this  morning, headache. EXAM: CT HEAD WITHOUT CONTRAST CT MAXILLOFACIAL WITHOUT CONTRAST TECHNIQUE: Multidetector CT imaging of the head and maxillofacial structures were performed using the standard protocol without intravenous contrast. Multiplanar CT image reconstructions of the maxillofacial structures were also generated. RADIATION DOSE REDUCTION: This exam was performed according to the departmental dose-optimization program which includes automated exposure control, adjustment of the mA and/or kV according to patient size and/or use of iterative reconstruction technique. COMPARISON:  CT head 12/29/2006 FINDINGS: CT HEAD FINDINGS Brain: There is no acute intracranial hemorrhage, extra-axial fluid collection, or acute infarct. Parenchymal volume is normal. The ventricles are normal in size. Gray-white differentiation is preserved. A partially empty sella is noted. There is no mass lesion.  There is no mass effect or midline shift. Vascular: No hyperdense  vessel or unexpected calcification. Skull: Normal. Negative for fracture or focal lesion. Other: None. CT MAXILLOFACIAL FINDINGS Osseous: There is no acute facial bone fracture. There is no evidence of mandibular dislocation. There is no suspicious osseous lesion. Orbits: The globes and orbits are unremarkable. There is no retrobulbar hematoma. Sinuses: Clear. Soft tissues: Unremarkable. IMPRESSION: 1. Normal noncontrast head CT. 2. No acute facial bone fracture. Electronically Signed   By: Valetta Mole M.D.   On: 08/20/2022 16:33    Procedures Procedures    Medications Ordered in ED Medications  tetracaine (PONTOCAINE) 0.5 % ophthalmic solution 2 drop (2 drops Left Eye Not Given 08/20/22 1736)  fluorescein ophthalmic strip 1 strip (1 strip Left Eye Not Given 08/20/22 1736)  HYDROcodone-acetaminophen (NORCO/VICODIN) 5-325 MG per tablet 1 tablet (1 tablet Oral Given 08/20/22 1729)    ED Course/ Medical Decision Making/ A&P                            Medical Decision Making Risk Prescription drug management.   Patient presents due to head injury.  Differential includes not limited to facial fracture, intracranial hemorrhage, concussion.  On exam no signs of basilar skull fracture.  She has no midline tenderness of the cervical spine complete ROM.  Based on Nexus C-spine criteria do not think any cervical spine imaging is indicated.  I ordered CT head and maxillofacial which is negative for any acute process.  I agree with radiologist interpretation.  I am suspicious about concussion.  Concussion clinic follow-up as well as home care was advised.  Zofran sent for the nausea.          Final Clinical Impression(s) / ED Diagnoses Final diagnoses:  Injury of head, initial encounter  Concussion with unknown loss of consciousness status, initial encounter    Rx / DC Orders ED Discharge Orders          Ordered    ondansetron (ZOFRAN-ODT) 8 MG disintegrating tablet  Every 8 hours PRN        08/20/22 1730              Sherrill Raring, PA-C 08/20/22 1745    Tretha Sciara, MD 08/20/22 1900

## 2022-08-20 NOTE — Discharge Instructions (Addendum)
Please follow-up with concussion clinic as needed.  You may need to follow-up with your primary Monday to get reevaluated before going back to work.  I suspect you have a concussion, please read the information above.

## 2022-08-23 ENCOUNTER — Telehealth: Payer: Self-pay

## 2022-08-23 ENCOUNTER — Encounter: Payer: Self-pay | Admitting: Physician Assistant

## 2022-08-23 NOTE — Telephone Encounter (Signed)
Transition Care Management Follow-up Telephone Call Date of discharge and from where: 08/20/22, [facility]   How have you been since you were released from the hospital? Not feeling since the release  Any questions or concerns? Yes, concerns to return to work due headache and being nauseated   Items Reviewed: Did the pt receive and understand the discharge instructions provided? Yes  Medications obtained and verified? Yes  Other? No  Any new allergies since your discharge? No  Dietary orders reviewed? No Do you have support at home? Yes   Home Care and Equipment/Supplies: Were home health services ordered? not applicable If so, what is the name of the agency? N/A  Has the agency set up a time to come to the patient's home? not applicable Were any new equipment or medical supplies ordered?  No What is the name of the medical supply agency? N/A Were you able to get the supplies/equipment? not applicable Do you have any questions related to the use of the equipment or supplies? No  Functional Questionnaire: (I = Independent and D = Dependent) ADLs: I  Bathing/Dressing- I  Meal Prep- I  Eating- I  Maintaining continence- I  Transferring/Ambulation- I  Managing Meds- I  Follow up appointments reviewed:  PCP Hospital f/u appt confirmed? Yes  Scheduled to see Dr. Mitchel Honour on 08/24/22 @ 2:4 pm. [facility] f/u appt confirmed? No  Scheduled to see N/A on N/A @ N/A. Are transportation arrangements needed? No  If their condition worsens, is the pt aware to call PCP or go to the Emergency Dept.? Yes Was the patient provided with contact information for the PCP's office or ED? Yes Was to pt encouraged to call back with questions or concerns? Yes

## 2022-08-23 NOTE — Telephone Encounter (Signed)
Made pt how aware and schedule her for a month follow up  Pt stated she went to the ED and she had a concussion, which they took her out of work for several days. Pt stated she has been having headaches, nauseated and vomiting and wondering that she need to go back to work or not. Let pt know she need to have a visit with provider to further evaluate her on her current health issue. Pt said she will wait till tomorrow or the day after and to see if it gotten any better and if not will schedule a visit with provider .

## 2022-08-24 ENCOUNTER — Encounter: Payer: Self-pay | Admitting: Emergency Medicine

## 2022-08-24 ENCOUNTER — Ambulatory Visit (INDEPENDENT_AMBULATORY_CARE_PROVIDER_SITE_OTHER): Payer: Commercial Managed Care - HMO | Admitting: Emergency Medicine

## 2022-08-24 VITALS — BP 122/78 | HR 63 | Temp 98.3°F | Ht 64.0 in | Wt 148.5 lb

## 2022-08-24 DIAGNOSIS — S069X0A Unspecified intracranial injury without loss of consciousness, initial encounter: Secondary | ICD-10-CM

## 2022-08-24 DIAGNOSIS — S069XAA Unspecified intracranial injury with loss of consciousness status unknown, initial encounter: Secondary | ICD-10-CM | POA: Insufficient documentation

## 2022-08-24 NOTE — Progress Notes (Signed)
Miranda Fowler 41 y.o.   Chief Complaint  Patient presents with   Follow-up    ED f/u Concussion, patient states she has a headache     HISTORY OF PRESENT ILLNESS: This is a 41 y.o. female here for follow-up of head injury sustained 5 days ago.  Was seen in the emergency department on 08/20/2022. Patient walked into a piece of playground equipment at daycare center while chasing 5-year-old.  Struck left side of her face/forehead.  No loss of consciousness. Intermittent headache and nausea since but slowly getting better.  Taking Zofran for nausea which helps. No new symptoms and slowly improving. Assessment and plan as follows: ED Course/ Medical Decision Making/ A&P                         Medical Decision Making Risk Prescription drug management.     Patient presents due to head injury.  Differential includes not limited to facial fracture, intracranial hemorrhage, concussion.   On exam no signs of basilar skull fracture.  She has no midline tenderness of the cervical spine complete ROM.  Based on Nexus C-spine criteria do not think any cervical spine imaging is indicated.   I ordered CT head and maxillofacial which is negative for any acute process.  I agree with radiologist interpretation.   I am suspicious about concussion.  Concussion clinic follow-up as well as home care was advised.  Zofran sent for the nausea.     IMPRESSION: 1. Normal noncontrast head CT. 2. No acute facial bone fracture.     Electronically Signed   By: Valetta Mole M.D.   On: 08/20/2022 16:33   HPI   Prior to Admission medications   Medication Sig Start Date End Date Taking? Authorizing Provider  albuterol (VENTOLIN HFA) 108 (90 Base) MCG/ACT inhaler Inhale 2 puffs into the lungs every 6 (six) hours as needed for wheezing or shortness of breath. 04/02/22   [name], NP  amphetamine-dextroamphetamine (ADDERALL) 5 MG tablet Take 1 tablet by mouth daily as needed for attention and  concentration 08/20/22   [name], NP  bisacodyl 5 MG EC tablet Take 1 tablet (5 mg total) by mouth daily as needed for moderate constipation. 04/02/22   [name], NP  cetirizine (ZYRTEC) 10 MG tablet Take 1 tablet (10 mg total) by mouth daily. 03/24/22   Teodora Medici, FNP  fluticasone (FLONASE) 50 MCG/ACT nasal spray Place 1 spray into both nostrils daily for 3 days. 03/24/22 03/27/22  Teodora Medici, FNP  naproxen (NAPROSYN) 500 MG tablet Take 1 tablet (500 mg total) by mouth 2 (two) times daily as needed. 03/27/22   Lucrezia Starch, MD  ondansetron (ZOFRAN-ODT) 8 MG disintegrating tablet Take 1 tablet (8 mg total) by mouth every 8 (eight) hours as needed for nausea or vomiting. 08/20/22   Sherrill Raring, PA-C  SYNTHROID 125 MCG tablet Take 1 tablet (125 mcg total) by mouth daily before breakfast. 02/12/22   [name], NP  trimethoprim-polymyxin b (POLYTRIM) ophthalmic solution Place 1 drop into the left eye every 4 (four) hours. 04/02/22   [name], NP    No Known Allergies  Patient Active Problem List   Diagnosis Date Noted   Nocturnal leg cramps 08/05/2022   Attention deficit hyperactivity disorder (ADHD) 08/05/2022   Leukopenia 04/02/2022   History of ADHD 02/12/2022   Unintentional weight loss 02/12/2022   Vaginal high risk HPV DNA test  positive 07/20/2021   Abnormal echocardiogram 08/30/2018   Anxiety 03/22/2018   Attention and concentration deficit 01/05/2018   Forgetfulness 01/05/2018   Attention deficit disorder (ADD) without hyperactivity 01/05/2018   Daytime somnolence 07/08/2016   Snoring 07/08/2016   Chronic fatigue 07/08/2016   Knee pain, bilateral 07/08/2016   Chronic pain of both knees 07/08/2016   Anemia 11/07/2015   Hypothyroidism 04/06/2010   Iron deficiency anemia 04/06/2010    Past Medical History:  Diagnosis Date   Anemia    Chronic problem for patient.  Followed by Heme, receives IV iron transfusions secondary to inability to tolerate PO  supplements.     Arthritis    bilateral knees   Headache    Migraines   Hypothyroidism    post-radiation at age 60   Shortness of breath dyspnea    Vaginal high risk HPV DNA test positive 07/20/2021    Past Surgical History:  Procedure Laterality Date   BILATERAL SALPINGECTOMY Bilateral 2008   pt denies   [place] N/A 04/06/2016   Procedure: CYSTOSCOPY;  Surgeon: Terrance Mass, MD;  Location: [facility];  Service: Gynecology;  Laterality: N/A;   LAPAROSCOPIC ASSISTED VAGINAL HYSTERECTOMY N/A 04/06/2016   Procedure: LAPAROSCOPIC ASSISTED VAGINAL HYSTERECTOMY,  Bilateral Salpingectomy;  Surgeon: Terrance Mass, MD;  Location: [facility];  Service: Gynecology;  Laterality: N/A;   TUBAL LIGATION      Social History   Socioeconomic History   Marital status: Married    Spouse name: Not on file   Number of children: Not on file   Years of education: Not on file   Highest education level: Not on file  Occupational History   Not on file  Tobacco Use   Smoking status: Never   Smokeless tobacco: Never  Vaping Use   Vaping Use: Never used  Substance and Sexual Activity   Alcohol use: No    Alcohol/week: 0.0 standard drinks of alcohol    Comment: wine   Drug use: No   Sexual activity: Yes    Birth control/protection: Injection, Surgical    Comment: TUBAL-LIAGTION  Other Topics Concern   Not on file  Social History Narrative   Married, 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  10/2016   Social Determinants of Health   Financial Resource Strain: Not on file  Food Insecurity: Not on file  Transportation Needs: Not on file  Physical Activity: Not on file  Stress: Not on file  Social Connections: Not on file  Intimate Partner Violence: Not on file    Family History  Problem Relation Age of Onset   Diabetes Maternal Grandmother    Cancer Paternal Aunt        breast   Heart disease Paternal Grandmother    Anemia Mother    Lupus Mother     Sickle cell trait Mother    Hypertension Father    Sickle cell trait Sister    Diabetes Maternal Grandfather    Cancer Paternal Aunt        cervical   Colon cancer Paternal Uncle      Review of Systems  Constitutional: Negative.  Negative for chills and fever.  HENT: Negative.  Negative for congestion and sore throat.   Eyes: Negative.  Negative for blurred vision and double vision.  Respiratory: Negative.  Negative for cough and shortness of breath.   Cardiovascular: Negative.  Negative for chest pain and palpitations.  Gastrointestinal:  Positive  for nausea. Negative for abdominal pain, diarrhea and vomiting.  Genitourinary: Negative.  Negative for hematuria.  Musculoskeletal:  Negative for neck pain.  Skin: Negative.  Negative for rash.  Neurological:  Positive for headaches. Negative for dizziness, sensory change, speech change, focal weakness, seizures and loss of consciousness.  All other systems reviewed and are negative.  Today's Vitals   08/24/22 1456  BP: 122/78  Pulse: 63  Temp: 98.3 F (36.8 C)  TempSrc: Oral  SpO2: 96%  Weight: 148 lb 8 oz (67.4 kg)  Height: '5\' 4"'$  (1.626 m)   Body mass index is 25.49 kg/m.   Physical Exam Vitals reviewed.  Constitutional:      Appearance: Normal appearance.  HENT:     Head: Normocephalic.     Mouth/Throat:     Mouth: Mucous membranes are moist.     Pharynx: Oropharynx is clear.  Eyes:     Extraocular Movements: Extraocular movements intact.     Conjunctiva/sclera: Conjunctivae normal.     Pupils: Pupils are equal, round, and reactive to light.  Cardiovascular:     Rate and Rhythm: Normal rate and regular rhythm.     Pulses: Normal pulses.     Heart sounds: Normal heart sounds.  Pulmonary:     Effort: Pulmonary effort is normal.     Breath sounds: Normal breath sounds.  Musculoskeletal:     Cervical back: No tenderness.  Lymphadenopathy:     Cervical: No cervical adenopathy.  Skin:    General: Skin is warm  and dry.  Neurological:     General: No focal deficit present.     Mental Status: She is alert and oriented to person, place, and time.     Cranial Nerves: No cranial nerve deficit.     Sensory: No sensory deficit.     Motor: No weakness.     Coordination: Coordination normal.     Gait: Gait normal.  Psychiatric:        Mood and Affect: Mood normal.        Behavior: Behavior normal.      ASSESSMENT & PLAN: A total of 43 minutes was spent with the patient and counseling/coordination of care regarding preparing for this visit, review of available medical records including emergency department visit on 08/20/2022, review of imaging reports done on that day, review of chronic medical problems under management, review of all medications, diagnosis of concussion and treatment, prognosis, documentation, and need for follow-up in 2 weeks.  Problem List Items Addressed This Visit       Nervous and Auditory   Mild traumatic brain injury ([place]) - Primary    Clinically stable and steadily improving. Normal neurological exam.  No deficits. Still having occasional headache and nausea but slowly getting better. May continue taking Zofran and can use Tylenol for headaches as needed. Able to return to work. Follow-up in 2 weeks with PCP.      Patient Instructions  Head Injury, Adult There are many types of head injuries. They can be as minor as a small bump. Some head injuries can be worse. Worse injuries include: A strong hit to the head that shakes the brain back and forth, causing damage (concussion). A bruise (contusion) of the brain. This means there is bleeding in the brain that can cause swelling. A cracked skull (skull fracture). Bleeding in the brain that gathers, gets thick (makes a clot), and forms a bump (hematoma). Most problems from a head injury come in the first 24 hours.  However, you may still have side effects up to 7-10 days after your injury. It is important to watch your  condition for any changes. You may need to be watched in the emergency department or urgent care, or you may need to stay in the hospital. What are the causes? There are many possible causes of a head injury. A serious head injury may be caused by: A car accident. Bicycle or motorcycle accidents. Sports injuries. Falls. Being hit by an object. What are the signs or symptoms? Symptoms of a head injury include a bruise, bump, or bleeding where the injury happened. Other physical symptoms may include: Headache. Feeling like you may vomit (nauseous) or vomiting. Dizziness. Blurred or double vision. Being uncomfortable around bright lights or loud noises. Shaking movements that you cannot control (seizures). Feeling tired. Trouble being woken up. Fainting or loss of consciousness. Mental or emotional symptoms may include: Feeling grumpy or cranky. Confusion and memory problems. Having trouble paying attention or concentrating. Changes in eating or sleeping habits. Feeling worried or nervous (anxious). Feeling sad (depressed). How is this treated? Treatment for this condition depends on how severe the injury is and the type of injury you have. The main goal is to prevent problems and to allow the brain time to heal. Mild head injury If you have a mild head injury, you may be sent home, and treatment may include: Being watched. A responsible adult should stay with you for 24 hours after your injury and check on you often. Physical rest. Brain rest. Pain medicines. Severe head injury If you have a severe head injury, treatment may include: Being watched closely. This includes staying in the hospital. Medicines to: Help with pain. Prevent seizures. Help with brain swelling. Protecting your airway and using a machine that helps you breathe (ventilator). Treatments to watch for and manage swelling inside the brain. Brain surgery. This may be needed to: Remove a collection of blood or  blood clots. Stop the bleeding. Remove a part of the skull. This allows room for the brain to swell. Follow these instructions at home: Activity Rest. Avoid activities that are hard or tiring. Make sure you get enough sleep. Let your brain rest. Do this by limiting activities that need a lot of thought or attention, such as: Watching TV. Playing memory games and puzzles. Job-related work or homework. Working on [employer], [employer], and texting. Avoid activities that could cause another head injury until your doctor says it is okay. This includes playing sports. Having another head injury, especially before the first one has healed, can be dangerous. Ask your doctor when it is safe for you to go back to your normal activities, such as work or school. Ask your doctor for a step-by-step plan for slowly going back to your normal activities. Ask your doctor when you can drive, ride a bicycle, or use heavy machinery. Do not do these activities if you are dizzy. Lifestyle  Do not drink alcohol until your doctor says it is okay. Do not use drugs. If it is harder than usual to remember things, write them down. If you are easily distracted, try to do one thing at a time. Talk with family members or close friends when making important decisions. Tell your friends, family, a trusted co-worker, and work Fre[employer] forward[employer] about your injury, symptoms, and limits (restrictions). Have them watch for any problems that are new or getting worse. General instructions Take over-the-counter and prescription medicines only as told by your doctor. Have someone  stay with you for 24 hours after your head injury. This person should watch you for any changes in your symptoms and be ready to get help. Keep all follow-up visits as told by your doctor. This is important. How is this prevented? Work on [employer]. This can help you avoid falls. Wear a seat belt when you are in a moving vehicle. Wear a  helmet when you: Ride a bicycle. Ski. Do any other sport or activity that has a risk of injury. If you drink alcohol: Limit how much you use to: 0-1 drink a day for nonpregnant women. 0-2 drinks a day for men. Be aware of how much alcohol is in your drink. In the [place] one drink equals one 12 oz bottle of beer (355 mL), one 5 oz glass of wine (148 mL), or one 1 oz glass of hard liquor (44 mL). Make your home safer by: Getting rid of clutter from the floors and stairs. This includes things that can make you trip. Using grab bars in bathrooms and handrails by stairs. Placing non-slip mats on floors and in bathtubs. Putting more light in dim areas. Where to find more information Centers for Disease Control and Prevention: http://www.wolf.info/ Get help right away if: You have: A very bad headache that is not helped by medicine. Trouble walking or weakness in your arms and legs. Clear or bloody fluid coming from your nose or ears. Changes in how you see (vision). A seizure. More confusion or more grumpy moods. Your symptoms get worse. You are sleepier than normal and have trouble staying awake. You lose your balance. The black centers of your eyes (pupils) change in size. Your speech is slurred. Your dizziness gets worse. You vomit. These symptoms may be an emergency. Do not wait to see if the symptoms will go away. Get medical help right away. Call your local emergency services (911 in the [place] Do not drive yourself to the hospital. Summary Head injuries can be as minor as a small bump. Some head injuries can be worse. Treatment for this condition depends on how severe the injury is and the type of injury you have. Have someone stay with you for 24 hours after your head injury. Ask your doctor when it is safe for you to go back to your normal activities, such as work or school. To prevent a head injury, wear a seat belt in a car, wear a helmet when you use a bicycle, limit your alcohol  use, and make your home safer. This information is not intended to replace advice given to you by your health care provider. Make sure you discuss any questions you have with your health care provider. Document Revised: 08/17/2019 Document Reviewed: 08/17/2019 Elsevier Patient Education  [place], [place] [facility]

## 2022-08-24 NOTE — Patient Instructions (Signed)
Head Injury, Adult There are many types of head injuries. They can be as minor as a small bump. Some head injuries can be worse. Worse injuries include: A strong hit to the head that shakes the brain back and forth, causing damage (concussion). A bruise (contusion) of the brain. This means there is bleeding in the brain that can cause swelling. A cracked skull (skull fracture). Bleeding in the brain that gathers, gets thick (makes a clot), and forms a bump (hematoma). Most problems from a head injury come in the first 24 hours. However, you may still have side effects up to 7-10 days after your injury. It is important to watch your condition for any changes. You may need to be watched in the emergency department or urgent care, or you may need to stay in the hospital. What are the causes? There are many possible causes of a head injury. A serious head injury may be caused by: A car accident. Bicycle or motorcycle accidents. Sports injuries. Falls. Being hit by an object. What are the signs or symptoms? Symptoms of a head injury include a bruise, bump, or bleeding where the injury happened. Other physical symptoms may include: Headache. Feeling like you may vomit (nauseous) or vomiting. Dizziness. Blurred or double vision. Being uncomfortable around bright lights or loud noises. Shaking movements that you cannot control (seizures). Feeling tired. Trouble being woken up. Fainting or loss of consciousness. Mental or emotional symptoms may include: Feeling grumpy or cranky. Confusion and memory problems. Having trouble paying attention or concentrating. Changes in eating or sleeping habits. Feeling worried or nervous (anxious). Feeling sad (depressed). How is this treated? Treatment for this condition depends on how severe the injury is and the type of injury you have. The main goal is to prevent problems and to allow the brain time to heal. Mild head injury If you have a mild head  injury, you may be sent home, and treatment may include: Being watched. A responsible adult should stay with you for 24 hours after your injury and check on you often. Physical rest. Brain rest. Pain medicines. Severe head injury If you have a severe head injury, treatment may include: Being watched closely. This includes staying in the hospital. Medicines to: Help with pain. Prevent seizures. Help with brain swelling. Protecting your airway and using a machine that helps you breathe (ventilator). Treatments to watch for and manage swelling inside the brain. Brain surgery. This may be needed to: Remove a collection of blood or blood clots. Stop the bleeding. Remove a part of the skull. This allows room for the brain to swell. Follow these instructions at home: Activity Rest. Avoid activities that are hard or tiring. Make sure you get enough sleep. Let your brain rest. Do this by limiting activities that need a lot of thought or attention, such as: Watching TV. Playing memory games and puzzles. Job-related work or homework. Working on [employer], [employer], and texting. Avoid activities that could cause another head injury until your doctor says it is okay. This includes playing sports. Having another head injury, especially before the first one has healed, can be dangerous. Ask your doctor when it is safe for you to go back to your normal activities, such as work or school. Ask your doctor for a step-by-step plan for slowly going back to your normal activities. Ask your doctor when you can drive, ride a bicycle, or use heavy machinery. Do not do these activities if you are dizzy. Lifestyle  Do  not drink alcohol until your doctor says it is okay. Do not use drugs. If it is harder than usual to remember things, write them down. If you are easily distracted, try to do one thing at a time. Talk with family members or close friends when making important decisions. Tell your  friends, family, a trusted co-worker, and work Fre[employer] forwarder about your injury, symptoms, and limits (restrictions). Have them watch for any problems that are new or getting worse. General instructions Take over-the-counter and prescription medicines only as told by your doctor. Have someone stay with you for 24 hours after your head injury. This person should watch you for any changes in your symptoms and be ready to get help. Keep all follow-up visits as told by your doctor. This is important. How is this prevented? Work on [employer]. This can help you avoid falls. Wear a seat belt when you are in a moving vehicle. Wear a helmet when you: Ride a bicycle. Ski. Do any other sport or activity that has a risk of injury. If you drink alcohol: Limit how much you use to: 0-1 drink a day for nonpregnant women. 0-2 drinks a day for men. Be aware of how much alcohol is in your drink. In the [place] one drink equals one 12 oz bottle of beer (355 mL), one 5 oz glass of wine (148 mL), or one 1 oz glass of hard liquor (44 mL). Make your home safer by: Getting rid of clutter from the floors and stairs. This includes things that can make you trip. Using grab bars in bathrooms and handrails by stairs. Placing non-slip mats on floors and in bathtubs. Putting more light in dim areas. Where to find more information Centers for Disease Control and Prevention: http://www.wolf.info/ Get help right away if: You have: A very bad headache that is not helped by medicine. Trouble walking or weakness in your arms and legs. Clear or bloody fluid coming from your nose or ears. Changes in how you see (vision). A seizure. More confusion or more grumpy moods. Your symptoms get worse. You are sleepier than normal and have trouble staying awake. You lose your balance. The black centers of your eyes (pupils) change in size. Your speech is slurred. Your dizziness gets worse. You vomit. These symptoms may be an  emergency. Do not wait to see if the symptoms will go away. Get medical help right away. Call your local emergency services (911 in the [place] Do not drive yourself to the hospital. Summary Head injuries can be as minor as a small bump. Some head injuries can be worse. Treatment for this condition depends on how severe the injury is and the type of injury you have. Have someone stay with you for 24 hours after your head injury. Ask your doctor when it is safe for you to go back to your normal activities, such as work or school. To prevent a head injury, wear a seat belt in a car, wear a helmet when you use a bicycle, limit your alcohol use, and make your home safer. This information is not intended to replace advice given to you by your health care provider. Make sure you discuss any questions you have with your health care provider. Document Revised: 08/17/2019 Document Reviewed: 08/17/2019 Elsevier Patient Education  [employer] [employer].

## 2022-08-24 NOTE — Assessment & Plan Note (Signed)
Clinically stable and steadily improving. Normal neurological exam.  No deficits. Still having occasional headache and nausea but slowly getting better. May continue taking Zofran and can use Tylenol for headaches as needed. Able to return to work. Follow-up in 2 weeks with PCP.

## 2022-08-25 ENCOUNTER — Ambulatory Visit
Admission: RE | Admit: 2022-08-25 | Discharge: 2022-08-25 | Disposition: A | Discharge status: Home / Self Care | Payer: Commercial Managed Care - HMO | Source: Ambulatory Visit | Attending: Nurse Practitioner | Admitting: Nurse Practitioner

## 2022-08-25 DIAGNOSIS — Z1231 Encounter for screening mammogram for malignant neoplasm of breast: Secondary | ICD-10-CM

## 2022-08-27 ENCOUNTER — Other Ambulatory Visit: Payer: Self-pay

## 2022-08-27 ENCOUNTER — Inpatient Hospital Stay: Payer: Commercial Managed Care - HMO

## 2022-08-27 ENCOUNTER — Inpatient Hospital Stay: Payer: Commercial Managed Care - HMO | Attending: Oncology | Admitting: Oncology

## 2022-08-27 VITALS — BP 138/88 | HR 62 | Temp 97.8°F | Resp 16 | Wt 151.1 lb

## 2022-08-27 DIAGNOSIS — Z789 Other specified health status: Secondary | ICD-10-CM | POA: Diagnosis not present

## 2022-08-27 DIAGNOSIS — D500 Iron deficiency anemia secondary to blood loss (chronic): Secondary | ICD-10-CM | POA: Insufficient documentation

## 2022-08-27 DIAGNOSIS — Z8742 Personal history of other diseases of the female genital tract: Secondary | ICD-10-CM

## 2022-08-27 DIAGNOSIS — N920 Excessive and frequent menstruation with regular cycle: Secondary | ICD-10-CM | POA: Diagnosis not present

## 2022-08-27 DIAGNOSIS — Z90710 Acquired absence of both cervix and uterus: Secondary | ICD-10-CM | POA: Insufficient documentation

## 2022-08-27 LAB — DIRECT ANTIGLOBULIN TEST (NOT AT ARMC)
DAT, IgG: NEGATIVE
DAT, complement: NEGATIVE

## 2022-08-27 LAB — CBC WITH DIFFERENTIAL (CANCER CENTER ONLY)
Abs Immature Granulocytes: 0 10*3/uL (ref 0.00–0.07)
Basophils Absolute: 0 10*3/uL (ref 0.0–0.1)
Basophils Relative: 1 %
Eosinophils Absolute: 0 10*3/uL (ref 0.0–0.5)
Eosinophils Relative: 0 %
HCT: 36.6 % (ref 36.0–46.0)
Hemoglobin: 12.2 g/dL (ref 12.0–15.0)
Immature Granulocytes: 0 %
Lymphocytes Relative: 51 %
Lymphs Abs: 1.9 10*3/uL (ref 0.7–4.0)
MCH: 33.2 pg (ref 26.0–34.0)
MCHC: 33.3 g/dL (ref 30.0–36.0)
MCV: 99.5 fL (ref 80.0–100.0)
Monocytes Absolute: 0.4 10*3/uL (ref 0.1–1.0)
Monocytes Relative: 12 %
Neutro Abs: 1.3 10*3/uL — ABNORMAL LOW (ref 1.7–7.7)
Neutrophils Relative %: 36 %
Platelet Count: 275 10*3/uL (ref 150–400)
RBC: 3.68 MIL/uL — ABNORMAL LOW (ref 3.87–5.11)
RDW: 10.6 % — ABNORMAL LOW (ref 11.5–15.5)
WBC Count: 3.6 10*3/uL — ABNORMAL LOW (ref 4.0–10.5)
nRBC: 0 % (ref 0.0–0.2)

## 2022-08-27 LAB — APTT: aPTT: 34 seconds (ref 24–36)

## 2022-08-27 LAB — PROTIME-INR
INR: 1.1 (ref 0.8–1.2)
Prothrombin Time: 13.7 seconds (ref 11.4–15.2)

## 2022-08-27 LAB — FERRITIN
Ferritin: 234 ng/mL (ref 11–307)
Ferritin: 242 ng/mL (ref 11–307)

## 2022-08-27 NOTE — Progress Notes (Unsigned)
[facility] Initial Visit:  Patient Care Team: [name], NP as PCP - General (Nurse Practitioner) Wyatt Portela, MD (Hematology and Oncology)  CHIEF COMPLAINTS/PURPOSE OF CONSULTATION:  Oncology History   No history exists.    HISTORY OF PRESENTING ILLNESS: Miranda Fowler 41 y.o. female is here because of  anemia Medical history notable for ADD, mild concussion, partial hysterectomy.  April 02, 2022 WBC 4.1 hemoglobin 10.7 MCV 100.8 platelet count 235; 48 segs 38 lymphs 14 monos Smear showed normocytic anemia with slight polychromasia and rare elliptocytes Sed rate 29 CRP <1.0 CCP 20 (weak positive) RF < 14  August 05, 2022 WBC 3.7 hemoglobin 10.9 MCV 100.8 platelet count 226.  Hemoglobin fractionation cascade showed normal hemoglobin Ferritin 184 B12 323 folate 13.9 BMP notable for glucose of 100  August 27 2022:  [facility] Hematology  Here to transfer hematology care.   Patient is G5 P4014.  Last pregnancy was in 2008.  Underwent hysterectomy in 2018 for management of menorrhagia.  No history of postpartum hemorrhage or postoperative hemorrhage.     Has tried low dose iron in the past and couldn't tolerate it due to nausea and emesis.  Has received IV iron in the past with last treatment about 10 yrs ago.  Last transfusion of PRBC's was in 2017 this was due to heavy menstrual bleeding.  No reaction to IV iron.  Has has a normal diet.  No hematochezia, melena, hemoptysis, hematuria.  No  history of intra-articular or soft tissue bleeding.  No history of abnormal bleeding in family members.  No PICA to ice/starch/dirt.    Social:  Married.  Writer.  Tobacco none.  EtOH occasional glass of wine  [facility] Mother alive 62 RA, thyroid disease Father alive 49 PTSD Sister alive 68 lupus, MS, sickle cell trait Brother alive 35 well Brother alive 31 well  Review of Systems - Oncology  MEDICAL HISTORY: Past Medical History:  Diagnosis Date    Anemia    Chronic problem for patient.  Followed by Heme, receives IV iron transfusions secondary to inability to tolerate PO supplements.     Arthritis    bilateral knees   Headache    Migraines   Hypothyroidism    post-radiation at age 57   Shortness of breath dyspnea    Vaginal high risk HPV DNA test positive 07/20/2021    SURGICAL HISTORY: Past Surgical History:  Procedure Laterality Date   BILATERAL SALPINGECTOMY Bilateral 2008   pt denies   [place] N/A 04/06/2016   Procedure: CYSTOSCOPY;  Surgeon: Terrance Mass, MD;  Location: [facility];  Service: Gynecology;  Laterality: N/A;   LAPAROSCOPIC ASSISTED VAGINAL HYSTERECTOMY N/A 04/06/2016   Procedure: LAPAROSCOPIC ASSISTED VAGINAL HYSTERECTOMY,  Bilateral Salpingectomy;  Surgeon: Terrance Mass, MD;  Location: [facility];  Service: Gynecology;  Laterality: N/A;   TUBAL LIGATION      SOCIAL HISTORY: Social History   Socioeconomic History   Marital status: Married    Spouse name: Not on file   Number of children: Not on file   Years of education: Not on file   Highest education level: Not on file  Occupational History   Not on file  Tobacco Use   Smoking status: Never   Smokeless tobacco: Never  Vaping Use   Vaping Use: Never used  Substance and Sexual Activity   Alcohol use: No    Alcohol/week: 0.0 standard drinks of alcohol  Comment: wine   Drug use: No   Sexual activity: Yes    Birth control/protection: Injection, Surgical    Comment: TUBAL-LIAGTION  Other Topics Concern   Not on file  Social History Narrative   Married, 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  10/2016   Social Determinants of Health   Financial Resource Strain: Not on file  Food Insecurity: Not on file  Transportation Needs: Not on file  Physical Activity: Not on file  Stress: Not on file  Social Connections: Not on file  Intimate Partner Violence: Not on file    FAMILY HISTORY Family History   Problem Relation Age of Onset   Anemia Mother    Lupus Mother    Sickle cell trait Mother    Hypertension Father    Sickle cell trait Sister    Breast cancer Paternal Aunt    Cancer Paternal Aunt        breast   Cancer Paternal Aunt        cervical   Colon cancer Paternal Uncle    Diabetes Maternal Grandmother    Diabetes Maternal Grandfather    Heart disease Paternal Grandmother     ALLERGIES:  has No Known Allergies.  MEDICATIONS:  Current Outpatient Medications  Medication Sig Dispense Refill   albuterol (VENTOLIN HFA) 108 (90 Base) MCG/ACT inhaler Inhale 2 puffs into the lungs every 6 (six) hours as needed for wheezing or shortness of breath. 8 g 0   amphetamine-dextroamphetamine (ADDERALL) 5 MG tablet Take 1 tablet by mouth daily as needed for attention and concentration 30 tablet 0   bisacodyl 5 MG EC tablet Take 1 tablet (5 mg total) by mouth daily as needed for moderate constipation. 30 tablet 2   cetirizine (ZYRTEC) 10 MG tablet Take 1 tablet (10 mg total) by mouth daily. 30 tablet 0   fluticasone (FLONASE) 50 MCG/ACT nasal spray Place 1 spray into both nostrils daily for 3 days. 16 g 0   naproxen (NAPROSYN) 500 MG tablet Take 1 tablet (500 mg total) by mouth 2 (two) times daily as needed. 20 tablet 0   ondansetron (ZOFRAN-ODT) 8 MG disintegrating tablet Take 1 tablet (8 mg total) by mouth every 8 (eight) hours as needed for nausea or vomiting. 20 tablet 0   SYNTHROID 125 MCG tablet Take 1 tablet (125 mcg total) by mouth daily before breakfast. 90 tablet 0   trimethoprim-polymyxin b (POLYTRIM) ophthalmic solution Place 1 drop into the left eye every 4 (four) hours. 10 mL 0   No current facility-administered medications for this visit.    PHYSICAL EXAMINATION:  ECOG PERFORMANCE STATUS: {CHL ONC ECOG [phone]}   There were no vitals filed for this visit.  There were no vitals filed for this visit.   Physical Exam   LABORATORY DATA: I have personally  reviewed the data as listed:  Lab on 08/06/2022  Component Date Value Ref Range Status   Hgb F 08/06/2022 0.0  0.0 - 2.0 % Final   Hgb A 08/06/2022 97.2  96.4 - 98.8 % Final   Hgb A2 08/06/2022 2.8  1.8 - 3.2 % Final   Hgb S 08/06/2022 0.0  0.0 % Final   Interpretation, Hgb Fract 08/06/2022 Comment   Final   Comment: Normal hemoglobin present; no hemoglobin variant or beta thalassemia identified. Note: Alpha thalassemia may not be detected by the Hgb Fractionation Cascade panel. If alpha thalassemia is suspected, [facility] offers Alpha-Thalassemia DNA Analysis [phone]).   Office  Visit on 08/05/2022  Component Date Value Ref Range Status   Magnesium 08/05/2022 1.9  1.5 - 2.5 mg/dL Final   Sodium 08/05/2022 138  135 - 145 mEq/L Final   Potassium 08/05/2022 3.5  3.5 - 5.1 mEq/L Final   Chloride 08/05/2022 104  96 - 112 mEq/L Final   CO2 08/05/2022 31  19 - 32 mEq/L Final   Glucose, Bld 08/05/2022 100 (H)  70 - 99 mg/dL Final   BUN 08/05/2022 13  6 - 23 mg/dL Final   Creatinine, Ser 08/05/2022 0.70  0.40 - 1.20 mg/dL Final   GFR 08/05/2022 107.18  >60.00 mL/min Final   Calculated using the CKD-EPI Creatinine Equation (2021)   Calcium 08/05/2022 9.8  8.4 - 10.5 mg/dL Final   WBC 08/05/2022 3.7 (L)  4.0 - 10.5 K/uL Final   RBC 08/05/2022 3.27 (L)  3.87 - 5.11 Mil/uL Final   Platelets 08/05/2022 226.0  150.0 - 400.0 K/uL Final   Hemoglobin 08/05/2022 10.9 (L)  12.0 - 15.0 g/dL Final   HCT 08/05/2022 32.9 (L)  36.0 - 46.0 % Final   MCV 08/05/2022 100.8 (H)  78.0 - 100.0 fl Final   MCHC 08/05/2022 33.0  30.0 - 36.0 g/dL Final   RDW 08/05/2022 11.8  11.5 - 15.5 % Final   Ferritin 08/05/2022 183.8  10.0 - 291.0 ng/mL Final   Total Iron Binding Capacity 08/05/2022 218 (L)  250 - 450 ug/dL Final   UIBC 08/05/2022 185  131 - 425 ug/dL Final   Iron 08/05/2022 33  27 - 159 ug/dL Final   Iron Saturation 08/05/2022 15  15 - 55 % Final   Folate 08/05/2022 13.9  >5.9 ng/mL Final   Vitamin B-12  08/05/2022 323  211 - 911 pg/mL Final    RADIOGRAPHIC STUDIES: I have personally reviewed the radiological images as listed and agree with the findings in the report  MM 3D SCREEN BREAST BILATERAL  Result Date: 08/27/2022 CLINICAL DATA:  Screening. EXAM: DIGITAL SCREENING BILATERAL MAMMOGRAM WITH TOMOSYNTHESIS AND CAD TECHNIQUE: Bilateral screening digital craniocaudal and mediolateral oblique mammograms were obtained. Bilateral screening digital breast tomosynthesis was performed. The images were evaluated with computer-aided detection. COMPARISON:  Previous exam(s). ACR Breast Density Category c: The breast tissue is heterogeneously dense, which may obscure small masses. FINDINGS: In the right breast calcifications require further evaluation. In the left breast calcifications require further evaluation. IMPRESSION: Further evaluation is suggested for possible calcifications in the right breast. Further evaluation is suggested for possible calcifications in the left breast. RECOMMENDATION: Diagnostic mammogram and possibly ultrasound of both breasts. (Code:FI-B-110M) The patient will be contacted regarding the findings, and additional imaging will be scheduled. BI-RADS CATEGORY  0: Incomplete. Need additional imaging evaluation and/or prior mammograms for comparison. Electronically Signed   By: Lovey Newcomer M.D.   On: 08/27/2022 12:51    ASSESSMENT/PLAN Cancer Staging  No matching staging information was found for the patient.   No problem-specific Assessment & Plan notes found for this encounter.   No orders of the defined types were placed in this encounter.   All questions were answered. The patient knows to call the clinic with any problems, questions or concerns.  This note was electronically signed.    Barbee Cough, MD  08/27/2022 3:27 PM

## 2022-08-27 NOTE — Progress Notes (Unsigned)
Per Dr. Federico Flake, f/u in 6 months

## 2022-08-29 LAB — HAPTOGLOBIN: Haptoglobin: 141 mg/dL (ref 42–296)

## 2022-08-30 ENCOUNTER — Other Ambulatory Visit: Payer: Self-pay | Admitting: Nurse Practitioner

## 2022-08-30 ENCOUNTER — Telehealth: Payer: Self-pay | Admitting: Oncology

## 2022-08-30 DIAGNOSIS — R928 Other abnormal and inconclusive findings on diagnostic imaging of breast: Secondary | ICD-10-CM

## 2022-08-30 LAB — COAG STUDIES INTERP REPORT

## 2022-08-30 LAB — VON WILLEBRAND PANEL
Coagulation Factor VIII: 116 % (ref 56–140)
Ristocetin Co-factor, Plasma: 67 % (ref 50–200)
Von Willebrand Antigen, Plasma: 137 % (ref 50–200)

## 2022-08-30 NOTE — Telephone Encounter (Signed)
Spoke with patient confirming upcoming appointments  

## 2022-08-31 ENCOUNTER — Encounter: Payer: Self-pay | Admitting: Oncology

## 2022-08-31 DIAGNOSIS — Z8742 Personal history of other diseases of the female genital tract: Secondary | ICD-10-CM | POA: Insufficient documentation

## 2022-08-31 DIAGNOSIS — Z789 Other specified health status: Secondary | ICD-10-CM | POA: Insufficient documentation

## 2022-09-20 ENCOUNTER — Ambulatory Visit
Admission: RE | Admit: 2022-09-20 | Discharge: 2022-09-20 | Disposition: A | Discharge status: Home / Self Care | Payer: Commercial Managed Care - HMO | Source: Ambulatory Visit | Attending: Nurse Practitioner | Admitting: Nurse Practitioner

## 2022-09-20 ENCOUNTER — Other Ambulatory Visit: Payer: Self-pay | Admitting: Nurse Practitioner

## 2022-09-20 DIAGNOSIS — R921 Mammographic calcification found on diagnostic imaging of breast: Secondary | ICD-10-CM

## 2022-09-20 DIAGNOSIS — R928 Other abnormal and inconclusive findings on diagnostic imaging of breast: Secondary | ICD-10-CM

## 2022-09-23 ENCOUNTER — Ambulatory Visit: Payer: Commercial Managed Care - HMO | Admitting: Nurse Practitioner

## 2022-09-28 ENCOUNTER — Ambulatory Visit: Payer: Commercial Managed Care - HMO | Admitting: Physician Assistant

## 2022-09-28 ENCOUNTER — Encounter: Payer: Self-pay | Admitting: Physician Assistant

## 2022-09-28 VITALS — BP 122/84 | HR 60 | Ht 64.0 in | Wt 146.0 lb

## 2022-09-28 DIAGNOSIS — D509 Iron deficiency anemia, unspecified: Secondary | ICD-10-CM | POA: Diagnosis not present

## 2022-09-28 DIAGNOSIS — R634 Abnormal weight loss: Secondary | ICD-10-CM | POA: Diagnosis not present

## 2022-09-28 DIAGNOSIS — K5909 Other constipation: Secondary | ICD-10-CM

## 2022-09-28 MED ORDER — NA SULFATE-K SULFATE-MG SULF 17.5-3.13-1.6 GM/177ML PO SOLN: 1.0000 | Freq: Once | ORAL | 0 refills | Status: AC

## 2022-09-28 MED ORDER — LINACLOTIDE 145 MCG PO CAPS: 145.0000 ug | ORAL_CAPSULE | Freq: Every day | ORAL | 2 refills | Status: DC

## 2022-09-28 NOTE — Progress Notes (Signed)
Chief Complaint: Anemia, Constipation, weight loss  HPI:    Miranda Fowler is a 41 year old African-American female with a past medical history as listed below including anemia for which she follows with hematology and receives IV iron transfusions, who was referred to me by [name], NP for a complaint of anemia, constipation and weight loss.      08/05/2022 hemoglobin 10.9.    08/27/2022 patient followed with hematology for her iron deficiency anemia she has been following with them since June 16 of this year.  Noted that she had underwent hysterectomy in 2018 for management of menorrhagia.  She could not tolerate low-dose oral iron in the past due to nausea and vomiting.  She had received IV iron with her last treatment about 10 years prior.  Labs that day with a hemoglobin of 12.2, iron 33, TIBC low at 218, ferritin 183, percent iron saturation 15%, normal folate and B12.  At that time discussed that her bleeding in the past was due to heavy menstrual bleeding when she was status post hysterectomy at the time had no evidence of anemia.  Ordered PT/PTT and von Ruthell Rummage panel.    Today, the patient tells me she has been anemic forever.  Previously this was blamed on her menorrhagia but she is status post hysterectomy in 2018.  She has continued to be anemic though.  She is recently followed with hematology who is checking for another source for her anemia on there and but she has never had endoscopic workup.    Also discusses chronic constipation with a bowel movement maybe once a week and sometimes it is just balls even then.  Very occasionally like every couple of months she will have "an extreme blowout".  She has tried mag citrate before which gives her messy urgent stools and she cannot go anywhere and has also tried MiraLAX which did not work well for her either.  When she goes past week she does get bloated and sometimes nauseous with some generalized abdominal pain.    Along with the above  tells me she has lost around 30 pounds without trying since August of this year.  Tells me she has never been a big eater and that has not changed.  Otherwise is not doing anything different.    Denies fever, chills, blood in her stool, vomiting or symptoms that awaken her from sleep.  Past Medical History:  Diagnosis Date   Anemia    Chronic problem for patient.  Followed by Heme, receives IV iron transfusions secondary to inability to tolerate PO supplements.     Arthritis    bilateral knees   Headache    Migraines   Hypothyroidism    post-radiation at age 4   Shortness of breath dyspnea    Vaginal high risk HPV DNA test positive 07/20/2021    Past Surgical History:  Procedure Laterality Date   BILATERAL SALPINGECTOMY Bilateral 2008   pt denies   [place] N/A 04/06/2016   Procedure: CYSTOSCOPY;  Surgeon: Terrance Mass, MD;  Location: [facility];  Service: Gynecology;  Laterality: N/A;   LAPAROSCOPIC ASSISTED VAGINAL HYSTERECTOMY N/A 04/06/2016   Procedure: LAPAROSCOPIC ASSISTED VAGINAL HYSTERECTOMY,  Bilateral Salpingectomy;  Surgeon: Terrance Mass, MD;  Location: [facility];  Service: Gynecology;  Laterality: N/A;   TUBAL LIGATION      Current Outpatient Medications  Medication Sig Dispense Refill   albuterol (VENTOLIN HFA) 108 (90 Base) MCG/ACT inhaler Inhale 2  puffs into the lungs every 6 (six) hours as needed for wheezing or shortness of breath. 8 g 0   amphetamine-dextroamphetamine (ADDERALL) 5 MG tablet Take 1 tablet by mouth daily as needed for attention and concentration 30 tablet 0   bisacodyl 5 MG EC tablet Take 1 tablet (5 mg total) by mouth daily as needed for moderate constipation. 30 tablet 2   cetirizine (ZYRTEC) 10 MG tablet Take 1 tablet (10 mg total) by mouth daily. 30 tablet 0   fluticasone (FLONASE) 50 MCG/ACT nasal spray Place 1 spray into both nostrils daily for 3 days. 16 g 0   naproxen (NAPROSYN) 500 MG tablet Take 1 tablet (500 mg  total) by mouth 2 (two) times daily as needed. 20 tablet 0   ondansetron (ZOFRAN-ODT) 8 MG disintegrating tablet Take 1 tablet (8 mg total) by mouth every 8 (eight) hours as needed for nausea or vomiting. 20 tablet 0   SYNTHROID 125 MCG tablet Take 1 tablet (125 mcg total) by mouth daily before breakfast. 90 tablet 0   trimethoprim-polymyxin b (POLYTRIM) ophthalmic solution Place 1 drop into the left eye every 4 (four) hours. 10 mL 0   No current facility-administered medications for this visit.    Allergies as of 09/28/2022   (No Known Allergies)    Family History  Problem Relation Age of Onset   Anemia Mother    Lupus Mother    Sickle cell trait Mother    Hypertension Father    Sickle cell trait Sister    Breast cancer Paternal Aunt    Cancer Paternal Aunt        breast   Cancer Paternal Aunt        cervical   Colon cancer Paternal Uncle    Diabetes Maternal Grandmother    Diabetes Maternal Grandfather    Heart disease Paternal Grandmother     Social History   Socioeconomic History   Marital status: Married    Spouse name: Not on file   Number of children: Not on file   Years of education: Not on file   Highest education level: Not on file  Occupational History   Not on file  Tobacco Use   Smoking status: Never   Smokeless tobacco: Never  Vaping Use   Vaping Use: Never used  Substance and Sexual Activity   Alcohol use: No    Alcohol/week: 0.0 standard drinks of alcohol    Comment: wine   Drug use: No   Sexual activity: Yes    Birth control/protection: Injection, Surgical    Comment: TUBAL-LIAGTION  Other Topics Concern   Not on file  Social History Narrative   Married, 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  10/2016   Social Determinants of Health   Financial Resource Strain: Not on file  Food Insecurity: Not on file  Transportation Needs: Not on file  Physical Activity: Not on file  Stress: Not on file  Social Connections: Not on file   Intimate Partner Violence: Not on file    Review of Systems:    Constitutional: No weight loss, fever or chills Skin: No rash  Cardiovascular: No chest pain  Respiratory: No SOB Gastrointestinal: See HPI and otherwise negative Genitourinary: No dysuria  Neurological: No headache, dizziness or syncope Musculoskeletal: No new muscle or joint pain Hematologic: No bleeding Psychiatric: No history of depression or anxiety   Physical Exam:  Vital signs: BP 122/84   Pulse 60   Ht '5\' 4"'$  (  1.626 m)   Wt 146 lb (66.2 kg)   LMP 11/07/2015   BMI 25.06 kg/m    Constitutional:   Pleasant AA female appears to be in NAD, Well developed, Well nourished, alert and cooperative Head:  Normocephalic and atraumatic. Eyes:   PEERL, EOMI. No icterus. Conjunctiva pink. Ears:  Normal auditory acuity. Neck:  Supple Throat: Oral cavity and pharynx without inflammation, swelling or lesion.  Respiratory: Respirations even and unlabored. Lungs clear to auscultation bilaterally.   No wheezes, crackles, or rhonchi.  Cardiovascular: Normal S1, S2. No MRG. Regular rate and rhythm. No peripheral edema, cyanosis or pallor.  Gastrointestinal:  Soft, nondistended, nontender. No rebound or guarding. Normal bowel sounds. No appreciable masses or hepatomegaly. Rectal:  Not performed.  Msk:  Symmetrical without gross deformities. Without edema, no deformity or joint abnormality.  Neurologic:  Alert and  oriented x4;  grossly normal neurologically.  Skin:   Dry and intact without significant lesions or rashes. Psychiatric: Demonstrates good judgement and reason without abnormal affect or behaviors.  RELEVANT LABS AND IMAGING: CBC    Component Value Date/Time   WBC 3.6 (L) 08/27/2022 1607   WBC 3.7 (L) 08/05/2022 1605   RBC 3.68 (L) 08/27/2022 1607   HGB 12.2 08/27/2022 1607   HGB 11.5 07/17/2021 1003   HGB 11.8 03/10/2016 1502   HCT 36.6 08/27/2022 1607   HCT 33.9 (L) 07/17/2021 1003   HCT 35.3 03/10/2016  1502   PLT 275 08/27/2022 1607   PLT 261 07/17/2021 1003   MCV 99.5 08/27/2022 1607   MCV 96 07/17/2021 1003   MCV 100.7 03/10/2016 1502   MCH 33.2 08/27/2022 1607   MCHC 33.3 08/27/2022 1607   RDW 10.6 (L) 08/27/2022 1607   RDW 10.1 (L) 07/17/2021 1003   RDW 12.1 03/10/2016 1502   LYMPHSABS 1.9 08/27/2022 1607   LYMPHSABS 1.2 07/17/2021 1003   LYMPHSABS 1.9 03/10/2016 1502   MONOABS 0.4 08/27/2022 1607   MONOABS 0.6 03/10/2016 1502   EOSABS 0.0 08/27/2022 1607   EOSABS 0.0 07/17/2021 1003   BASOSABS 0.0 08/27/2022 1607   BASOSABS 0.0 07/17/2021 1003   BASOSABS 0.1 03/10/2016 1502    CMP     Component Value Date/Time   NA 138 08/05/2022 1605   NA 138 07/17/2021 1003   NA 140 11/02/2013 1336   K 3.5 08/05/2022 1605   K 3.8 11/02/2013 1336   CL 104 08/05/2022 1605   CL 108 (H) 12/14/2012 1342   CO2 31 08/05/2022 1605   CO2 27 11/02/2013 1336   GLUCOSE 100 (H) 08/05/2022 1605   GLUCOSE 97 11/02/2013 1336   GLUCOSE 80 12/14/2012 1342   BUN 13 08/05/2022 1605   BUN 7 07/17/2021 1003   BUN 9.1 11/02/2013 1336   CREATININE 0.70 08/05/2022 1605   CREATININE 0.89 08/26/2016 1505   CREATININE 0.8 11/02/2013 1336   CALCIUM 9.8 08/05/2022 1605   CALCIUM 8.9 11/02/2013 1336   PROT 7.6 02/19/2022 0853   PROT 7.2 07/17/2021 1003   PROT 6.8 11/02/2013 1336   ALBUMIN 4.0 02/19/2022 0853   ALBUMIN 4.1 07/17/2021 1003   ALBUMIN 3.8 11/02/2013 1336   AST 34 02/19/2022 0853   AST 16 11/02/2013 1336   ALT 40 (H) 02/19/2022 0853   ALT 8 11/02/2013 1336   ALKPHOS 60 02/19/2022 0853   ALKPHOS 47 11/02/2013 1336   BILITOT 0.7 02/19/2022 0853   BILITOT 0.4 07/17/2021 1003   BILITOT 0.36 11/02/2013 1336   GFRNONAA >60 03/27/2022 6063  GFRAA 89 12/03/2020 1016    Assessment: 1.  Chronic constipation: 1 bowel movement a week sometimes less, previous trial of MiraLAX and Mag citrate which were unhelpful 2.  Iron deficiency anemia: Chronic for the patient, previously blamed on  menorrhagia, now status post hysterectomy over the past 5 years and ongoing anemia; consider GI source of blood loss 3.  Weight loss: 30 pounds over the past 4 months per patient without trying; radiology  Plan: 1.  Discussed with patient that having a bowel movement once a week is not normal.  She has tried over-the-counter products including MiraLAX and mag citrate in the past and has not liked the results.  We will try Linzess 145 mcg daily.  Provided her with samples and also a prescription #30 with 5 refills. 2.  Scheduled patient for diagnostic EGD and colonoscopy in the [place] with Dr. Silverio Decamp as patient requested on Monday and the supervising physician did not have availability.  Did provide the patient a detailed list of risks for the procedures and she agrees to proceed. Patient is appropriate for endoscopic procedure(s) in the ambulatory ([place]) setting.  She will have a 2-day bowel prep. 3.  Patient to follow clinic per recommendations after the above.  Ellouise Newer, PA-C [facility] 09/28/2022, 2:40 PM  Cc: [name], NP

## 2022-09-28 NOTE — Patient Instructions (Addendum)
We have sent the following medications to your pharmacy for you to pick up at your convenience: Linzess 145 mcg daily before breakfast (samples and script provided).  You have been scheduled for an endoscopy and colonoscopy. Please follow the written instructions given to you at your visit today. Please pick up your prep supplies at the pharmacy within the next 1-3 days. If you use inhalers (even only as needed), please bring them with you on the day of your procedure.  _______________________________________________________  If you are age 41 or older, your body mass index should be between 23-30. Your Body mass index is 25.06 kg/m. If this is out of the aforementioned range listed, please consider follow up with your Primary Care Provider.  If you are age 47 or younger, your body mass index should be between 19-25. Your Body mass index is 25.06 kg/m. If this is out of the aformentioned range listed, please consider follow up with your Primary Care Provider.   ________________________________________________________  The [facility] providers would like to encourage you to use [facility] to communicate with providers for non-urgent requests or questions.  Due to long hold times on the telephone, sending your provider a message by [facility] may be a faster and more efficient way to get a response.  Please allow 48 business hours for a response.  Please remember that this is for non-urgent requests.  _______________________________________________________

## 2022-10-05 ENCOUNTER — Ambulatory Visit
Admission: RE | Admit: 2022-10-05 | Discharge: 2022-10-05 | Disposition: A | Discharge status: Home / Self Care | Payer: Commercial Managed Care - HMO | Source: Ambulatory Visit | Attending: Nurse Practitioner | Admitting: Nurse Practitioner

## 2022-10-05 DIAGNOSIS — R928 Other abnormal and inconclusive findings on diagnostic imaging of breast: Secondary | ICD-10-CM

## 2022-10-05 DIAGNOSIS — R921 Mammographic calcification found on diagnostic imaging of breast: Secondary | ICD-10-CM

## 2022-10-05 HISTORY — PX: BREAST BIOPSY: SHX20

## 2022-10-14 ENCOUNTER — Other Ambulatory Visit: Payer: Self-pay | Admitting: Nurse Practitioner

## 2022-10-14 DIAGNOSIS — Z8639 Personal history of other endocrine, nutritional and metabolic disease: Secondary | ICD-10-CM

## 2022-10-14 DIAGNOSIS — E039 Hypothyroidism, unspecified: Secondary | ICD-10-CM

## 2022-10-23 ENCOUNTER — Encounter: Payer: Self-pay | Admitting: Certified Registered Nurse Anesthetist

## 2022-10-25 ENCOUNTER — Encounter: Payer: Self-pay | Admitting: Gastroenterology

## 2022-10-25 ENCOUNTER — Ambulatory Visit (AMBULATORY_SURGERY_CENTER): Payer: Commercial Managed Care - HMO | Admitting: Gastroenterology

## 2022-10-25 VITALS — BP 109/88 | HR 70 | Temp 97.1°F | Resp 15 | Ht 64.0 in | Wt 146.0 lb

## 2022-10-25 DIAGNOSIS — K5909 Other constipation: Secondary | ICD-10-CM

## 2022-10-25 DIAGNOSIS — R195 Other fecal abnormalities: Secondary | ICD-10-CM

## 2022-10-25 DIAGNOSIS — K2970 Gastritis, unspecified, without bleeding: Secondary | ICD-10-CM | POA: Diagnosis present

## 2022-10-25 DIAGNOSIS — R634 Abnormal weight loss: Secondary | ICD-10-CM

## 2022-10-25 DIAGNOSIS — K2950 Unspecified chronic gastritis without bleeding: Secondary | ICD-10-CM | POA: Diagnosis not present

## 2022-10-25 DIAGNOSIS — D509 Iron deficiency anemia, unspecified: Secondary | ICD-10-CM | POA: Diagnosis not present

## 2022-10-25 MED ORDER — SODIUM CHLORIDE 0.9 % IV SOLN: 500.0000 mL | Freq: Once | INTRAVENOUS | Status: DC

## 2022-10-25 NOTE — Progress Notes (Signed)
Pt's states no medical or surgical changes since previsit or office visit. 

## 2022-10-25 NOTE — Op Note (Signed)
[facility] Patient Name: Miranda Fowler Procedure Date: 10/25/2022 2:51 PM MRN: [id number] Endoscopist: Mauri Pole , MD, [id number] Age: 42 Referring MD:  Date of Birth: 1980/10/23 Gender: Female Account #: [id number] Procedure:                Upper GI endoscopy Indications:              Suspected upper gastrointestinal bleeding in                            patient with unexplained iron deficiency anemia,                            Iron deficiency anemia with no gastrointestinal                            bleeding source identified during previous                            colonoscopy Medicines:                Monitored Anesthesia Care Procedure:                Pre-Anesthesia Assessment:                           - Prior to the procedure, a History and Physical                            was performed, and patient medications and                            allergies were reviewed. The patient's tolerance of                            previous anesthesia was also reviewed. The risks                            and benefits of the procedure and the sedation                            options and risks were discussed with the patient.                            All questions were answered, and informed consent                            was obtained. Prior Anticoagulants: The patient has                            taken no anticoagulant or antiplatelet agents. ASA                            Grade Assessment: II - A patient with mild systemic  disease. After reviewing the risks and benefits,                            the patient was deemed in satisfactory condition to                            undergo the procedure.                           After obtaining informed consent, the endoscope was                            passed under direct vision. Throughout the                            procedure, the patient's blood pressure, pulse, and                             oxygen saturations were monitored continuously. The                            GIF [id number] was introduced through the                            mouth, and advanced to the second part of duodenum.                            The upper GI endoscopy was accomplished without                            difficulty. The patient tolerated the procedure                            well. The photographs were not saved in provation                            due to technical error Scope In: Scope Out: Findings:                 The Z-line was regular and was found 38 cm from the                            incisors.                           No gross lesions were noted in the entire esophagus.                           Patchy mild inflammation characterized by                            congestion (edema) and erythema was found in the                            entire examined stomach.  Biopsies were taken with a                            cold forceps for Helicobacter pylori testing.                           The cardia and gastric fundus were normal on                            retroflexion.                           Mucosal flattening was found in the first portion                            of the duodenum and in the second portion of the                            duodenum. Biopsies for histology were taken with a                            cold forceps for evaluation of celiac disease.                           The exam was otherwise without abnormality. Complications:            No immediate complications. Estimated Blood Loss:     Estimated blood loss was minimal. Impression:               - Z-line regular, 38 cm from the incisors.                           - No gross lesions in the entire esophagus.                           - Gastritis. Biopsied.                           - Flattened mucosa was found in the duodenum, rule                            out  celiac disease. Biopsied.                           - The examination was otherwise normal. Recommendation:           - Patient has a contact number available for                            emergencies. The signs and symptoms of potential                            delayed complications were discussed with the                            patient. Return to  normal activities tomorrow.                            Written discharge instructions were provided to the                            patient.                           - Resume previous diet.                           - Continue present medications.                           - Await pathology results.                           - Return to GI office at the next available                            appointment. Mauri Pole, MD 10/25/2022 3:42:40 PM This report has been signed electronically.

## 2022-10-25 NOTE — Patient Instructions (Addendum)
Resume all of your medications today.  Read all of the handouts given to you by you recovery room nurse.  Dr Woodward Ku nurse will call you with your aplpointment as the schedule is booked through March.  YOU HAD AN ENDOSCOPIC PROCEDURE TODAY AT [facility]:   Refer to the procedure report that was given to you for any specific questions about what was found during the examination.  If the procedure report does not answer your questions, please call your gastroenterologist to clarify.  If you requested that your care partner not be given the details of your procedure findings, then the procedure report has been included in a sealed envelope for you to review at your convenience later.  YOU SHOULD EXPECT: Some feelings of bloating in the abdomen. Passage of more gas than usual.  Walking can help get rid of the air that was put into your GI tract during the procedure and reduce the bloating. If you had a lower endoscopy (such as a colonoscopy or flexible sigmoidoscopy) you may notice spotting of blood in your stool or on the toilet paper. If you underwent a bowel prep for your procedure, you may not have a normal bowel movement for a few days.  Please Note:  You might notice some irritation and congestion in your nose or some drainage.  This is from the oxygen used during your procedure.  There is no need for concern and it should clear up in a day or so.  SYMPTOMS TO REPORT IMMEDIATELY:  Following lower endoscopy (colonoscopy or flexible sigmoidoscopy):  Excessive amounts of blood in the stool  Significant tenderness or worsening of abdominal pains  Swelling of the abdomen that is new, acute  Fever of 100F or higher  Following upper endoscopy (EGD)  Vomiting of blood or coffee ground material  New chest pain or pain under the shoulder blades  Painful or persistently difficult swallowing  New shortness of breath  Fever of 100F or higher  Black, tarry-looking stools  For  urgent or emergent issues, a gastroenterologist can be reached at any hour by calling [phone]. Do not use MyChart messaging for urgent concerns.    DIET:  We do recommend a small meal at first, but then you may proceed to your regular diet.  Drink plenty of fluids but you should avoid alcoholic beverages for 24 hours.  ACTIVITY:  You should plan to take it easy for the rest of today and you should NOT DRIVE or use heavy machinery until tomorrow (because of the sedation medicines used during the test).    FOLLOW UP: Our staff will call the number listed on your records the next business day following your procedure.  We will call around 7:15- 8:00 am to check on you and address any questions or concerns that you may have regarding the information given to you following your procedure. If we do not reach you, we will leave a message.     If any biopsies were taken you will be contacted by phone or by letter within the next 1-3 weeks.  Please call us at [phone] if you have not heard about the biopsies in 3 weeks.    SIGNATURES/CONFIDENTIALITY: You and/or your care partner have signed paperwork which will be entered into your electronic medical record.  These signatures attest to the fact that that the information above on your After Visit Summary has been reviewed and is understood.  Full responsibility of the confidentiality of this discharge  information lies with you and/or your care-partner. 

## 2022-10-25 NOTE — Op Note (Signed)
[facility] Patient Name: Miranda Fowler Procedure Date: 10/25/2022 2:41 PM MRN: [id number] Endoscopist: Mauri Pole , MD, [id number] Age: 42 Referring MD:  Date of Birth: 10-19-1980 Gender: Female Account #: [id number] Procedure:                Colonoscopy Indications:              Unexplained iron deficiency anemia, Unexplained                            iron deficiency anemia, Change in bowel habits,                            Constipation Medicines:                Monitored Anesthesia Care Procedure:                Pre-Anesthesia Assessment:                           - Prior to the procedure, a History and Physical                            was performed, and patient medications and                            allergies were reviewed. The patient's tolerance of                            previous anesthesia was also reviewed. The risks                            and benefits of the procedure and the sedation                            options and risks were discussed with the patient.                            All questions were answered, and informed consent                            was obtained. Prior Anticoagulants: The patient has                            taken no anticoagulant or antiplatelet agents. ASA                            Grade Assessment: II - A patient with mild systemic                            disease. After reviewing the risks and benefits,                            the patient was deemed in satisfactory condition to  undergo the procedure.                           After obtaining informed consent, the colonoscope                            was passed under direct vision. Throughout the                            procedure, the patient's blood pressure, pulse, and                            oxygen saturations were monitored continuously. The                            Olympus [id number]) Colonoscope  was                            introduced through the anus and advanced to the the                            cecum, identified by appendiceal orifice and                            ileocecal valve. The colonoscopy was performed                            without difficulty. The patient tolerated the                            procedure well. The quality of the bowel                            preparation was good. The ileocecal valve,                            appendiceal orifice, and rectum were photographed. Scope In: 3:07:34 PM Scope Out: 3:27:33 PM Scope Withdrawal Time: 0 hours 9 minutes 36 seconds  Total Procedure Duration: 0 hours 19 minutes 59 seconds  Findings:                 The perianal and digital rectal examinations were                            normal.                           Non-bleeding internal hemorrhoids were found during                            retroflexion. The hemorrhoids were medium-sized.                           The exam was otherwise without abnormality. Complications:            No immediate complications. Estimated Blood Loss:  Estimated blood loss was minimal. Impression:               - Non-bleeding internal hemorrhoids.                           - The examination was otherwise normal.                           - No specimens collected. Recommendation:           - Patient has a contact number available for                            emergencies. The signs and symptoms of potential                            delayed complications were discussed with the                            patient. Return to normal activities tomorrow.                            Written discharge instructions were provided to the                            patient.                           - Resume previous diet.                           - Continue present medications.                           - Repeat colonoscopy in 10 years for surveillance.                            - To visualize the small bowel, perform video                            capsule endoscopy at appointment to be scheduled. Mauri Pole, MD 10/25/2022 3:39:58 PM This report has been signed electronically.

## 2022-10-25 NOTE — Progress Notes (Unsigned)
1454 Robinul 0.1 mg IV given due large amount of secretions upon assessment.  MD made aware, vss

## 2022-10-25 NOTE — Progress Notes (Unsigned)
[facility] History and Physical   Primary Care Physician:  [name], NP   Reason for Procedure:  Iron deficiency anemia, weight loss, constipation/change in bowel habits  Plan:    EGD and colonoscopy with possible interventions as needed     HPI: Miranda Fowler is a very pleasant 42 y.o. female here for evaluation of Iron deficiency anemia, weight loss, constipation/change in bowel habits  S/p IV iron infusion Plan to proceed with EGD and colonoscopy for further evaluation  The risks and benefits as well as alternatives of endoscopic procedure(s) have been discussed and reviewed. All questions answered. The patient agrees to proceed.    Past Medical History:  Diagnosis Date   Anemia    Chronic problem for patient.  Followed by Heme, receives IV iron transfusions secondary to inability to tolerate PO supplements.     Arthritis    bilateral knees   Headache    Migraines   Hypothyroidism    post-radiation at age 81   Shortness of breath dyspnea    Vaginal high risk HPV DNA test positive 07/20/2021    Past Surgical History:  Procedure Laterality Date   BILATERAL SALPINGECTOMY Bilateral 2008   pt denies   BREAST BIOPSY Right 10/05/2022   MM RT BREAST BX W LOC DEV 1ST LESION IMAGE BX SPEC STEREO GUIDE 10/05/2022 [facility]   BREAST BIOPSY Left 10/05/2022   MM LT BREAST BX W LOC DEV 1ST LESION IMAGE BX SPEC STEREO GUIDE 10/05/2022 [facility]   CESAREAN SECTION     CYSTOSCOPY N/A 04/06/2016   Procedure: CYSTOSCOPY;  Surgeon: Terrance Mass, MD;  Location: [facility];  Service: Gynecology;  Laterality: N/A;   LAPAROSCOPIC ASSISTED VAGINAL HYSTERECTOMY N/A 04/06/2016   Procedure: LAPAROSCOPIC ASSISTED VAGINAL HYSTERECTOMY,  Bilateral Salpingectomy;  Surgeon: Terrance Mass, MD;  Location: [facility];  Service: Gynecology;  Laterality: N/A;   TUBAL LIGATION      Prior to Admission medications   Medication Sig Start Date End Date Taking?  Authorizing Provider  amphetamine-dextroamphetamine (ADDERALL) 5 MG tablet Take 1 tablet by mouth daily as needed for attention and concentration 08/20/22   [name], NP  cetirizine (ZYRTEC) 10 MG tablet Take 1 tablet (10 mg total) by mouth daily. Patient taking differently: Take 10 mg by mouth daily as needed. 03/24/22   Teodora Medici, FNP  linaclotide [facility]) 145 MCG CAPS capsule Take 1 capsule (145 mcg total) by mouth daily before breakfast. 09/28/22   Levin Erp, PA  naproxen (NAPROSYN) 500 MG tablet Take 1 tablet (500 mg total) by mouth 2 (two) times daily as needed. 03/27/22   Lucrezia Starch, MD  SYNTHROID 125 MCG tablet Take 1 tablet (125 mcg total) by mouth daily before breakfast. 02/12/22   [name], NP    Current Outpatient Medications  Medication Sig Dispense Refill   amphetamine-dextroamphetamine (ADDERALL) 5 MG tablet Take 1 tablet by mouth daily as needed for attention and concentration 30 tablet 0   cetirizine (ZYRTEC) 10 MG tablet Take 1 tablet (10 mg total) by mouth daily. (Patient taking differently: Take 10 mg by mouth daily as needed.) 30 tablet 0   linaclotide (LINZESS) 145 MCG CAPS capsule Take 1 capsule (145 mcg total) by mouth daily before breakfast. 30 capsule 2   naproxen (NAPROSYN) 500 MG tablet Take 1 tablet (500 mg total) by mouth 2 (two) times daily as needed. 20 tablet 0   SYNTHROID 125 MCG tablet Take 1 tablet (  125 mcg total) by mouth daily before breakfast. 90 tablet 0   Current Facility-Administered Medications  Medication Dose Route Frequency Provider Last Rate Last Admin   0.9 %  sodium chloride infusion  500 mL Intravenous Once Mauri Pole, MD        Allergies as of 10/25/2022   (No Known Allergies)    Family History  Problem Relation Age of Onset   Anemia Mother    Lupus Mother    Sickle cell trait Mother    Hypertension Father    Sickle cell trait Sister    Breast cancer Paternal Aunt    Cancer Paternal Aunt         breast   Cancer Paternal Aunt        cervical   Colon cancer Paternal Uncle    Diabetes Maternal Grandmother    Diabetes Maternal Grandfather    Heart disease Paternal Grandmother     Social History   Socioeconomic History   Marital status: Married    Spouse name: Not on file   Number of children: Not on file   Years of education: Not on file   Highest education level: Not on file  Occupational History   Not on file  Tobacco Use   Smoking status: Never   Smokeless tobacco: Never  Vaping Use   Vaping Use: Never used  Substance and Sexual Activity   Alcohol use: No    Alcohol/week: 0.0 standard drinks of alcohol    Comment: wine   Drug use: No   Sexual activity: Yes    Birth control/protection: Injection, Surgical    Comment: TUBAL-LIAGTION  Other Topics Concern   Not on file  Social History Narrative   Married, 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  10/2016   Social Determinants of Health   Financial Resource Strain: Not on file  Food Insecurity: Not on file  Transportation Needs: Not on file  Physical Activity: Not on file  Stress: Not on file  Social Connections: Not on file  Intimate Partner Violence: Not on file    Review of Systems:  All other review of systems negative except as mentioned in the HPI.  Physical Exam: Vital signs in last 24 hours: Blood Pressure 107/75   Pulse 60   Temperature (Abnormal) 97.1 F (36.2 C) (Temporal)   Height '5\' 4"'$  (1.626 m)   Weight 146 lb (66.2 kg)   Last Menstrual Period 11/07/2015   Oxygen Saturation 100%   Body Mass Index 25.06 kg/m  General:   Alert, NAD Lungs:  Clear .   Heart:  Regular rate and rhythm Abdomen:  Soft, nontender and nondistended. Neuro/Psych:  Alert and cooperative. Normal mood and affect. A and O x 3  Reviewed labs, radiology imaging, old records and pertinent past GI work up  Patient is appropriate for planned procedure(s) and anesthesia in an ambulatory setting   K.  Denzil Magnuson , MD [phone]

## 2022-10-25 NOTE — Progress Notes (Unsigned)
Called to room to assist during endoscopic procedure.  Patient ID and intended procedure confirmed with present staff. Received instructions for my participation in the procedure from the performing physician.  

## 2022-10-25 NOTE — Progress Notes (Unsigned)
1530 Report given to PACU, vss

## 2022-10-26 ENCOUNTER — Encounter: Payer: Self-pay | Admitting: Gastroenterology

## 2022-10-26 ENCOUNTER — Other Ambulatory Visit: Payer: Self-pay

## 2022-10-26 ENCOUNTER — Telehealth: Payer: Self-pay

## 2022-10-26 NOTE — Telephone Encounter (Signed)
Left message on follow up call. 

## 2022-10-27 ENCOUNTER — Ambulatory Visit (INDEPENDENT_AMBULATORY_CARE_PROVIDER_SITE_OTHER): Payer: Commercial Managed Care - HMO | Admitting: Obstetrics and Gynecology

## 2022-10-27 ENCOUNTER — Other Ambulatory Visit: Payer: Self-pay

## 2022-10-27 ENCOUNTER — Other Ambulatory Visit (HOSPITAL_COMMUNITY)
Admission: RE | Admit: 2022-10-27 | Discharge: 2022-10-27 | Disposition: A | Discharge status: Home / Self Care | Payer: Commercial Managed Care - HMO | Source: Ambulatory Visit | Attending: Obstetrics and Gynecology | Admitting: Obstetrics and Gynecology

## 2022-10-27 ENCOUNTER — Encounter: Payer: Self-pay | Admitting: Obstetrics and Gynecology

## 2022-10-27 VITALS — BP 126/89 | HR 64 | Ht 63.0 in | Wt 149.0 lb

## 2022-10-27 DIAGNOSIS — Z90711 Acquired absence of uterus with remaining cervical stump: Secondary | ICD-10-CM | POA: Diagnosis not present

## 2022-10-27 DIAGNOSIS — D509 Iron deficiency anemia, unspecified: Secondary | ICD-10-CM

## 2022-10-27 DIAGNOSIS — Z113 Encounter for screening for infections with a predominantly sexual mode of transmission: Secondary | ICD-10-CM | POA: Diagnosis not present

## 2022-10-27 DIAGNOSIS — Z01419 Encounter for gynecological examination (general) (routine) without abnormal findings: Secondary | ICD-10-CM | POA: Insufficient documentation

## 2022-10-27 DIAGNOSIS — R634 Abnormal weight loss: Secondary | ICD-10-CM

## 2022-10-27 NOTE — Progress Notes (Signed)
Pt presents to establish care. Partial Hysterectomy 2017. Requesting STD testing.

## 2022-10-29 LAB — CERVICOVAGINAL ANCILLARY ONLY
Chlamydia: NEGATIVE
Comment: NEGATIVE
Comment: NEGATIVE
Comment: NORMAL
Neisseria Gonorrhea: NEGATIVE
Trichomonas: NEGATIVE

## 2022-10-29 LAB — RPR: RPR Ser Ql: REACTIVE — AB

## 2022-10-29 LAB — HEPATITIS C ANTIBODY: Hep C Virus Ab: NONREACTIVE

## 2022-10-29 LAB — HEPATITIS B SURFACE ANTIGEN: Hepatitis B Surface Ag: NEGATIVE

## 2022-10-29 LAB — RPR, QUANT+TP ABS (REFLEX)
Rapid Plasma Reagin, Quant: 1:1 {titer} — ABNORMAL HIGH
T Pallidum Abs: NONREACTIVE

## 2022-10-29 LAB — HIV ANTIBODY (ROUTINE TESTING W REFLEX): HIV Screen 4th Generation wRfx: NONREACTIVE

## 2022-11-01 ENCOUNTER — Encounter: Payer: Self-pay | Admitting: Obstetrics and Gynecology

## 2022-11-01 NOTE — Progress Notes (Signed)
   WELL-WOMAN PHYSICAL & PAP Patient name: Miranda Fowler MRN [id number]  Date of birth: 09-07-1981 Chief Complaint:   Establish Care  History of Present Illness:   Miranda Fowler is a 42 y.o. [id number] African American female being seen today for a routine well-woman exam.  Current complaints: pap and STI testing. Per patient she had a "partial" hysterectomy in 2017. She is unsure if she has a cervix.  PCP: Jeralyn Ruths, NP      does desire labs Patient's last menstrual period was 11/07/2015. The current method of family planning is status post hysterectomy.  Last pap 07/17/2021. Results were: normal with (+) HRHPV Last mammogram: 09/20/2022. Results were: abnormal  bilateral calcifications . Family h/o breast cancer: No Last colonoscopy: n/a. Family h/o colorectal cancer: No Review of Systems:   Pertinent items are noted in HPI Denies any headaches, blurred vision, fatigue, shortness of breath, chest pain, abdominal pain, abnormal vaginal discharge/itching/odor/irritation, problems with periods, bowel movements, urination, or intercourse unless otherwise stated above. Pertinent History Reviewed:  Reviewed past medical,surgical, social and family history.  Reviewed problem list, medications and allergies. Physical Assessment:   Vitals:   10/27/22 1522  BP: 126/89  Pulse: 64  Weight: 149 lb (67.6 kg)  Height: '5\' 3"'$  (1.6 m)  Body mass index is 26.39 kg/m.        Physical Examination:   General appearance - well appearing, and in no distress  Mental status - alert, oriented to person, place, and time  Psych:  She has a normal mood and affect  Skin - warm and dry, normal color, no suspicious lesions noted  Chest - effort normal, all lung fields clear to auscultation bilaterally  Heart - normal rate and regular rhythm  Neck:  midline trachea, no thyromegaly or nodules  Breasts - breasts appear normal, no suspicious masses, no skin or nipple changes or  axillary nodes  Abdomen -  soft, nontender, nondistended, no masses or organomegaly  Pelvic - VULVA: normal appearing vulva with no masses, tenderness or lesions  VAGINA: normal appearing vagina with normal color and discharge, no lesions  CERVIX: not visualized  Thin prep pap is done with HR HPV cotesting  UTERUS: surgically removed  ADNEXA: No adnexal masses or tenderness noted.  Rectal - deferred  Extremities:  No swelling or varicosities noted  No results found for this or any previous visit (from the past 24 hour(s)).  Assessment & Plan:  1) Well-Woman Exam with Pap  2) Encounter for well woman exam with routine gynecological exam - Cytology - PAP( [facility]) - Cervicovaginal ancillary only( [place])  3) Screening examination for sexually transmitted disease - HIV antibody (with reflex) - Hepatitis C Antibody - Hepatitis B Surface AntiGEN - RPR  4) S/P partial hysterectomy - After chart review pathology report from hysterectomy found with impression of no uterus or cervix   Labs/procedures today: pap and STI testing  Mammogram annually or sooner if problems Colonoscopy at age 55 or sooner if problems  Orders Placed This Encounter  Procedures   HIV antibody (with reflex)   Hepatitis C Antibody   Hepatitis B Surface AntiGEN   RPR   RPR, quant & T.pallidum antibodies    Meds: No orders of the defined types were placed in this encounter.   Follow-up: No follow-ups on file.  Laury Deep MSN, CNM 10/27/2022 345 PM

## 2022-11-02 LAB — CYTOLOGY - PAP
Adequacy: ABSENT
Comment: NEGATIVE
Diagnosis: UNDETERMINED — AB
High risk HPV: NEGATIVE

## 2022-11-03 ENCOUNTER — Encounter: Payer: Self-pay | Admitting: Obstetrics and Gynecology

## 2022-11-03 NOTE — Progress Notes (Signed)
TC. Advised pt of result and recommendation. Advised that 2017 path report shows that uterus, cervix and fallopian tubes were removed. Advised no further f/u needed for ASCUS, but encouraged annual GYN exam since ovaries are still present. Pt verbalized understanding.

## 2022-11-04 ENCOUNTER — Encounter: Payer: Self-pay | Admitting: Gastroenterology

## 2022-11-04 ENCOUNTER — Ambulatory Visit (INDEPENDENT_AMBULATORY_CARE_PROVIDER_SITE_OTHER): Payer: Commercial Managed Care - HMO | Admitting: Gastroenterology

## 2022-11-04 DIAGNOSIS — D500 Iron deficiency anemia secondary to blood loss (chronic): Secondary | ICD-10-CM | POA: Diagnosis not present

## 2022-11-04 NOTE — Progress Notes (Signed)
SN: [id number] Exp: 01/19/24 LOT: [id number]  Patient arrived for VCE. Reported the prep went well. This RN explained capsule dietary restrictions for the next few hours. Pt advised to return at 4 pm to return capsule equipment.  Patient verbalized understanding. Opened capsule, ensured capsule was flashing prior to the patient swallowing the capsule. Patient swallowed capsule without difficulty.  Patient told to call the office with any questions and if capsule has not passed after 72 hours. No further questions by the conclusion of the visit.

## 2022-11-04 NOTE — Patient Instructions (Signed)
Contact our office immediately at [phone] if you suffer from any abdominal pain, nausea, or vomiting during capsule endoscopy. °a) Do not eat or drink for at least 2 hours. °After 2 hours you may have any of the following to drink: °Water   White grape juice °7-Up   Chicken Bouillon °Sprite   Ginger Ale °c) After 4 hours you may have a light snack to include any of the following: °A cup of soup  ½ sandwich °Bowl of cereal  Rice °Toast   Eggs °2-3 small cookies (i.e. vanilla wafers or graham crackers) °d) After 8 hours you may return to your regular diet. °During your procedure do not go near anyone else that is having capsule endoscopy. °Do not be in close contact with an MRI machine or a radio or television tower. °Do not wear a heavy coat or sweater because your recorder may over heat and stop recording.   °Do not disconnect the equipment or remove the belt at any time.  Since the Data Recorder is actually a small computer, it should be treated with utmost care and protection.  Avoid sudden movement and banging of the Data Recorder.  Do not do any heavy lifting or strenuous physical activity during the test especially if it involves sweating and do not bend over or stoop during capsule endoscopy. °During capsule endoscopy, you will need to verify every 15 minutes that the small light on top of the Data Recorder is blinking twice per second.  If for some reason it stops blinking at this site, record the time and contact our office at [phone]. ° ° ° °

## 2022-11-05 ENCOUNTER — Ambulatory Visit: Payer: Commercial Managed Care - HMO | Admitting: Nurse Practitioner

## 2022-11-05 ENCOUNTER — Other Ambulatory Visit: Payer: Self-pay | Admitting: Nurse Practitioner

## 2022-11-05 DIAGNOSIS — F902 Attention-deficit hyperactivity disorder, combined type: Secondary | ICD-10-CM

## 2022-11-05 NOTE — Telephone Encounter (Signed)
Caller & Relationship to patient: PT  Call back number: [phone]  Date of last office visit: 08/24/22  Date of next office visit:  N/A (PT will call for reschedule)  Medication(s) to be refilled:  amphetamine-dextroamphetamine (ADDERALL) 5 MG tablet   SYNTHROID 125 MCG tablet   Preferred Pharmacy:   [facility] [place], [place]

## 2022-11-08 ENCOUNTER — Telehealth: Payer: Self-pay | Admitting: Gastroenterology

## 2022-11-08 ENCOUNTER — Ambulatory Visit (INDEPENDENT_AMBULATORY_CARE_PROVIDER_SITE_OTHER)
Admission: RE | Admit: 2022-11-08 | Discharge: 2022-11-08 | Disposition: A | Discharge status: Home / Self Care | Payer: Commercial Managed Care - HMO | Source: Ambulatory Visit | Attending: Gastroenterology | Admitting: Gastroenterology

## 2022-11-08 DIAGNOSIS — T184XXA Foreign body in colon, initial encounter: Secondary | ICD-10-CM

## 2022-11-08 NOTE — Telephone Encounter (Signed)
Returned call to patient. I informed her that if she has not seen the capsule she will need to stop by the office at her convenience to complete an abdominal x-ray. Pt is aware that no appt is necessary and we will be in touch once imaging has been reviewed. Pt verbalized understanding and had no concerns at the end of the call.   KUB order in epic.

## 2022-11-08 NOTE — Telephone Encounter (Signed)
Had capsule EGD  on 1/18 and it still has not passed through. Please advise

## 2022-11-09 NOTE — Telephone Encounter (Signed)
Returned call to patient. Pt had questions regarding pathology result letter that she had received yesterday. I reviewed the letter with patient and told her that Dr. Silverio Decamp will further discuss at the time of her upcoming f/u appt. Pt is aware that we will also be in touch once we have her x-ray results. Pt verbalized understanding and had no concerns at the end of the call.

## 2022-11-09 NOTE — Telephone Encounter (Signed)
Patient called requesting to speak with you regarding results in her MyChart that she does not understand.

## 2022-11-11 MED ORDER — AMPHETAMINE-DEXTROAMPHETAMINE 5 MG PO TABS: ORAL_TABLET | ORAL | 0 refills | Status: DC

## 2022-11-19 ENCOUNTER — Encounter: Payer: Self-pay | Admitting: Gastroenterology

## 2022-11-22 ENCOUNTER — Other Ambulatory Visit: Payer: Self-pay | Admitting: Oncology

## 2022-11-23 ENCOUNTER — Encounter: Payer: Self-pay | Admitting: Oncology

## 2022-11-23 ENCOUNTER — Encounter: Payer: Self-pay | Admitting: Gastroenterology

## 2022-11-23 ENCOUNTER — Ambulatory Visit (INDEPENDENT_AMBULATORY_CARE_PROVIDER_SITE_OTHER): Payer: Self-pay | Admitting: Gastroenterology

## 2022-11-23 VITALS — BP 118/80 | HR 78 | Ht 63.0 in | Wt 152.0 lb

## 2022-11-23 DIAGNOSIS — D509 Iron deficiency anemia, unspecified: Secondary | ICD-10-CM

## 2022-11-23 DIAGNOSIS — R140 Abdominal distension (gaseous): Secondary | ICD-10-CM

## 2022-11-23 DIAGNOSIS — K638219 Small intestinal bacterial overgrowth, unspecified: Secondary | ICD-10-CM

## 2022-11-23 DIAGNOSIS — D132 Benign neoplasm of duodenum: Secondary | ICD-10-CM

## 2022-11-23 DIAGNOSIS — K581 Irritable bowel syndrome with constipation: Secondary | ICD-10-CM

## 2022-11-23 NOTE — Progress Notes (Addendum)
Miranda Fowler    [id number]    [birth year]  Primary Care Physician:[name], [name], NP  Referring Physician: [name], NP [address] [place],  [place] [address]   Chief complaint: Iron deficiency anemia  HPI: 42 year old very pleasant female here for follow-up visit for iron deficiency anemia.  Overall no significant change in her symptoms.  She continues to have extreme fatigue.  She is receiving IV iron replacement.  Denies any rectal bleeding or heavy menstrual blood loss.  She has intermittent constipation, generalized bloating and cramping occasionally.  Small bowel video capsule November 04, 2022 She had an incidental small duodenal polyp otherwise unremarkable   EGD January 2024: Mild gastritis and duodenal mucosal flattening, biopsies negative for H. pylori infection or celiac disease . Colonoscopy January 2024 Hemorrhoids otherwise normal  CRP undetectable, sed rate mildly elevated at 29 in June 2023 She had extensive hematologic to exclude hemoglobinopathies or sickle cell anemia  Iron/TIBC/Ferritin/ %Sat    Component Value Date/Time   IRON 33 08/05/2022 1605   IRON 35 (L) 03/10/2016 1502   TIBC 218 (L) 08/05/2022 1605   TIBC 218 (L) 03/10/2016 1502   FERRITIN 242 08/27/2022 1607   FERRITIN 234 08/27/2022 1607   FERRITIN 348 (H) 07/17/2021 1003   FERRITIN 127 03/10/2016 1502   IRONPCTSAT 15 08/05/2022 1605   IRONPCTSAT 32 07/08/2016 1050     Outpatient Encounter Medications as of 11/23/2022  Medication Sig   amphetamine-dextroamphetamine (ADDERALL) 5 MG tablet Take 1 tablet by mouth daily as needed for attention and concentration   linaclotide (LINZESS) 145 MCG CAPS capsule Take 1 capsule (145 mcg total) by mouth daily before breakfast.   SYNTHROID 125 MCG tablet Take 1 tablet (125 mcg total) by mouth daily before breakfast.   cetirizine (ZYRTEC) 10 MG tablet Take 1 tablet (10 mg total) by mouth daily. (Patient not taking: Reported on  11/23/2022)   naproxen (NAPROSYN) 500 MG tablet Take 1 tablet (500 mg total) by mouth 2 (two) times daily as needed. (Patient not taking: Reported on 11/23/2022)   No facility-administered encounter medications on file as of 11/23/2022.    Allergies as of 11/23/2022   (No Known Allergies)    Past Medical History:  Diagnosis Date   Anemia    Chronic problem for patient.  Followed by Heme, receives IV iron transfusions secondary to inability to tolerate PO supplements.     Arthritis    bilateral knees   Headache    Migraines   Hypothyroidism    post-radiation at age 63   Shortness of breath dyspnea    Vaginal high risk HPV DNA test positive 07/20/2021    Past Surgical History:  Procedure Laterality Date   BILATERAL SALPINGECTOMY Bilateral 2008   pt denies   BREAST BIOPSY Right 10/05/2022   MM RT BREAST BX W LOC DEV 1ST LESION IMAGE BX SPEC STEREO GUIDE 10/05/2022 [facility]   BREAST BIOPSY Left 10/05/2022   MM LT BREAST BX W LOC DEV 1ST LESION IMAGE BX SPEC STEREO GUIDE 10/05/2022 [facility]   CESAREAN SECTION     CYSTOSCOPY N/A 04/06/2016   Procedure: CYSTOSCOPY;  Surgeon: Terrance Mass, MD;  Location: [facility];  Service: Gynecology;  Laterality: N/A;   LAPAROSCOPIC ASSISTED VAGINAL HYSTERECTOMY N/A 04/06/2016   Procedure: LAPAROSCOPIC ASSISTED VAGINAL HYSTERECTOMY,  Bilateral Salpingectomy;  Surgeon: Terrance Mass, MD;  Location: [facility];  Service: Gynecology;  Laterality: N/A;  TUBAL LIGATION      Family History  Problem Relation Age of Onset   Anemia Mother    Lupus Mother    Sickle cell trait Mother    Hypertension Father    Sickle cell trait Sister    Breast cancer Paternal Aunt    Cancer Paternal Aunt        breast   Cancer Paternal Aunt        cervical   Colon cancer Paternal Uncle    Diabetes Maternal Grandmother    Diabetes Maternal Grandfather    Heart disease Paternal Grandmother     Social History   Socioeconomic History   Marital  status: Married    Spouse name: Not on file   Number of children: Not on file   Years of education: Not on file   Highest education level: Not on file  Occupational History   Not on file  Tobacco Use   Smoking status: Never   Smokeless tobacco: Never  Vaping Use   Vaping Use: Never used  Substance and Sexual Activity   Alcohol use: No    Alcohol/week: 0.0 standard drinks of alcohol    Comment: wine   Drug use: No   Sexual activity: Yes    Birth control/protection: Surgical    Comment: TUBAL-LIAGTION  Other Topics Concern   Not on file  Social History Narrative   Married, 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  10/2016   Social Determinants of Health   Financial Resource Strain: Not on file  Food Insecurity: Not on file  Transportation Needs: Not on file  Physical Activity: Not on file  Stress: Not on file  Social Connections: Not on file  Intimate Partner Violence: Not on file      Review of systems: All other review of systems negative except as mentioned in the HPI.   Physical Exam: Vitals:   11/23/22 1409  BP: 118/80  Pulse: 78  SpO2: 97%   Body mass index is 26.93 kg/m. Gen:      No acute distress HEENT:  sclera anicteric Abd:      soft, non-tender; no palpable masses, no distension Ext:    No edema Neuro: alert and oriented x 3 Psych: normal mood and affect  Data Reviewed:  Reviewed labs, radiology imaging, old records and pertinent past GI work up   Assessment and Plan/Recommendations:  42 year old female with chronic iron deficiency anemia Mild gastritis on endoscopic exam otherwise no high risk lesion to account for possible GI blood loss  Incidentally finding of duodenal polyp on small bowel video capsule, will plan for push enteroscopy with polypectomy, will need to exclude neoplastic or adenomatous precancerous lesion    IBS constipation: Start Linzess 72 mcg daily Increase dietary fiber and water intake  Generalized  abdominal bloating and discomfort could be secondary to IBS constipation but if continues to have persistent symptoms despite improvement of bowel habits with Linzess, will consider empirically treating for small intestinal bacterial overgrowth with Xifaxan  Return after enteroscopy   The patient was provided an opportunity to ask questions and all were answered. The patient agreed with the plan and demonstrated an understanding of the instructions.  Damaris Hippo , MD    CC: [name], NP

## 2022-11-23 NOTE — Patient Instructions (Signed)
_______________________________________________________  If your blood pressure at your visit was 140/90 or greater, please contact your primary care physician to follow up on this.  _______________________________________________________  If you are age 42 or older, your body mass index should be between 23-30. Your Body mass index is 26.93 kg/m. If this is out of the aforementioned range listed, please consider follow up with your Primary Care Provider.  If you are age 71 or younger, your body mass index should be between 19-25. Your Body mass index is 26.93 kg/m. If this is out of the aformentioned range listed, please consider follow up with your Primary Care Provider.   ________________________________________________________  The [facility] providers would like to encourage you to use [facility] to communicate with providers for non-urgent requests or questions.  Due to long hold times on the telephone, sending your provider a message by [facility] may be a faster and more efficient way to get a response.  Please allow 48 business hours for a response.  Please remember that this is for non-urgent requests.  _______________________________________________________  Dennis Bast have been scheduled for an endoscopy/enteroscopy. Please follow written instructions given to you at your visit today. If you use inhalers (even only as needed), please bring them with you on the day of your procedure.  Due to recent changes in healthcare laws, you may see the results of your imaging and laboratory studies on MyChart before your provider has had a chance to review them.  We understand that in some cases there may be results that are confusing or concerning to you. Not all laboratory results come back in the same time frame and the provider may be waiting for multiple results in order to interpret others.  Please give [place] 48 hours in order for your provider to thoroughly review all the results before contacting the  office for clarification of your results.    Linzess 83mg, take 1 a day, Sample #8  Linzess works best when taken once a day every day, on an empty stomach, at least 30 minutes before your first meal of the day.  When Linzess is taken daily as directed:  *Constipation relief is typically felt in about a week *IBS-C patients may begin to experience relief from belly pain and overall abdominal symptoms (pain, discomfort, and bloating) in about 1 week,   with symptoms typically improving over 12 weeks.  Diarrhea may occur in the first 2 weeks -keep taking it.  The diarrhea should go away and you should start having normal, complete, full bowel movements. It may be helpful to start treatment when you can be near the comfort of your own bathroom, such as a weekend.    Increase your fiber intake.  Increase your water intake.  If no improvement call and we can consider Xifaxan.  Follow up in 4 months.  It was a pleasure to see you today!  Thank you for trusting me with your gastrointestinal care!

## 2022-12-01 ENCOUNTER — Telehealth: Payer: Self-pay | Admitting: Nurse Practitioner

## 2022-12-01 ENCOUNTER — Other Ambulatory Visit: Payer: Self-pay | Admitting: Nurse Practitioner

## 2022-12-01 DIAGNOSIS — E039 Hypothyroidism, unspecified: Secondary | ICD-10-CM

## 2022-12-01 DIAGNOSIS — Z8639 Personal history of other endocrine, nutritional and metabolic disease: Secondary | ICD-10-CM

## 2022-12-01 NOTE — Telephone Encounter (Signed)
Pt called wanted to get refill for her thyroid medicine  SYNTHROID. Pt complaining that her thyroids has been acting up her appt is March 14th pt was inquiring could she get something before then.

## 2022-12-02 ENCOUNTER — Other Ambulatory Visit: Payer: Self-pay

## 2022-12-02 DIAGNOSIS — E039 Hypothyroidism, unspecified: Secondary | ICD-10-CM

## 2022-12-02 DIAGNOSIS — Z8639 Personal history of other endocrine, nutritional and metabolic disease: Secondary | ICD-10-CM

## 2022-12-02 MED ORDER — SYNTHROID 125 MCG PO TABS: 125.0000 ug | ORAL_TABLET | Freq: Every day | ORAL | 0 refills | Status: DC

## 2022-12-02 NOTE — Telephone Encounter (Signed)
Medication is send to pt pharmacy and also send my chart message

## 2022-12-30 ENCOUNTER — Encounter: Payer: Self-pay | Admitting: Oncology

## 2022-12-30 ENCOUNTER — Ambulatory Visit (INDEPENDENT_AMBULATORY_CARE_PROVIDER_SITE_OTHER): Payer: 59 | Admitting: Nurse Practitioner

## 2022-12-30 VITALS — BP 116/88 | HR 60 | Temp 98.0°F | Ht 63.0 in | Wt 148.4 lb

## 2022-12-30 DIAGNOSIS — E785 Hyperlipidemia, unspecified: Secondary | ICD-10-CM | POA: Diagnosis not present

## 2022-12-30 DIAGNOSIS — E039 Hypothyroidism, unspecified: Secondary | ICD-10-CM | POA: Diagnosis not present

## 2022-12-30 DIAGNOSIS — J019 Acute sinusitis, unspecified: Secondary | ICD-10-CM

## 2022-12-30 DIAGNOSIS — Z131 Encounter for screening for diabetes mellitus: Secondary | ICD-10-CM | POA: Diagnosis not present

## 2022-12-30 DIAGNOSIS — Z0001 Encounter for general adult medical examination with abnormal findings: Secondary | ICD-10-CM | POA: Diagnosis not present

## 2022-12-30 DIAGNOSIS — M25562 Pain in left knee: Secondary | ICD-10-CM | POA: Diagnosis not present

## 2022-12-30 DIAGNOSIS — M25561 Pain in right knee: Secondary | ICD-10-CM | POA: Diagnosis not present

## 2022-12-30 DIAGNOSIS — H6121 Impacted cerumen, right ear: Secondary | ICD-10-CM | POA: Diagnosis not present

## 2022-12-30 DIAGNOSIS — G8929 Other chronic pain: Secondary | ICD-10-CM | POA: Diagnosis not present

## 2022-12-30 DIAGNOSIS — F902 Attention-deficit hyperactivity disorder, combined type: Secondary | ICD-10-CM | POA: Diagnosis not present

## 2022-12-30 LAB — CBC
HCT: 34.4 % — ABNORMAL LOW (ref 36.0–46.0)
Hemoglobin: 11.8 g/dL — ABNORMAL LOW (ref 12.0–15.0)
MCHC: 34.3 g/dL (ref 30.0–36.0)
MCV: 102.2 fl — ABNORMAL HIGH (ref 78.0–100.0)
Platelets: 275 10*3/uL (ref 150.0–400.0)
RBC: 3.37 Mil/uL — ABNORMAL LOW (ref 3.87–5.11)
RDW: 13 % (ref 11.5–15.5)
WBC: 3.5 10*3/uL — ABNORMAL LOW (ref 4.0–10.5)

## 2022-12-30 LAB — COMPREHENSIVE METABOLIC PANEL
ALT: 18 U/L (ref 0–35)
AST: 19 U/L (ref 0–37)
Albumin: 4 g/dL (ref 3.5–5.2)
Alkaline Phosphatase: 71 U/L (ref 39–117)
BUN: 12 mg/dL (ref 6–23)
CO2: 29 mEq/L (ref 19–32)
Calcium: 10 mg/dL (ref 8.4–10.5)
Chloride: 102 mEq/L (ref 96–112)
Creatinine, Ser: 0.79 mg/dL (ref 0.40–1.20)
GFR: 92.44 mL/min (ref >60.00)
Glucose, Bld: 82 mg/dL (ref 70–99)
Potassium: 3.8 mEq/L (ref 3.5–5.1)
Sodium: 137 mEq/L (ref 135–145)
Total Bilirubin: 0.8 mg/dL (ref 0.2–1.2)
Total Protein: 8 g/dL (ref 6.0–8.3)

## 2022-12-30 LAB — TSH: TSH: 0.96 u[IU]/mL (ref 0.35–5.50)

## 2022-12-30 LAB — HEMOGLOBIN A1C: Hgb A1c MFr Bld: 5.2 % (ref 4.6–6.5)

## 2022-12-30 LAB — LIPID PANEL
Cholesterol: 172 mg/dL (ref 0–200)
HDL: 76.3 mg/dL (ref >39.00)
LDL Cholesterol: 90 mg/dL (ref 0–99)
NonHDL: 95.8
Total CHOL/HDL Ratio: 2
Triglycerides: 30 mg/dL (ref 0.0–149.0)
VLDL: 6 mg/dL (ref 0.0–40.0)

## 2022-12-30 MED ORDER — NAPROXEN 500 MG PO TABS: 500.0000 mg | ORAL_TABLET | Freq: Two times a day (BID) | ORAL | 0 refills | Status: DC | PRN

## 2022-12-30 MED ORDER — AMPHETAMINE-DEXTROAMPHETAMINE 5 MG PO TABS: ORAL_TABLET | ORAL | 0 refills | Status: DC

## 2022-12-30 MED ORDER — AZITHROMYCIN 250 MG PO TABS: ORAL_TABLET | ORAL | 0 refills | Status: AC

## 2022-12-30 NOTE — Assessment & Plan Note (Signed)
Continue Adderall 5 mg a mouth daily as needed, [place] controlled [place] reviewed today.  Vital signs stable blood pressure not elevated.  Follow-up in 3 months.

## 2022-12-30 NOTE — Assessment & Plan Note (Signed)
Check TSH, Further recommendations may be made based on these results.

## 2022-12-30 NOTE — Addendum Note (Signed)
Addended by: Ferol Luz, Ayansh Feutz P on: 12/30/2022 02:13 PM   Modules accepted: Orders

## 2022-12-30 NOTE — Assessment & Plan Note (Signed)
Labs ordered today, further recommendations may be made based upon these results. 

## 2022-12-30 NOTE — Assessment & Plan Note (Signed)
Patient underwent ear lavage today, tolerated procedure well.  Reports symptoms improved, tympanic membranes visualized after lavage.  No signs of infection noted.  Patient provided information on Debrox drops that she can use as needed for wax buildup.

## 2022-12-30 NOTE — Assessment & Plan Note (Signed)
Labs ordered, further recommendations may be made based upon the results.

## 2022-12-30 NOTE — Assessment & Plan Note (Signed)
Chronic, bilateral x-rays ordered today.  Refill naproxen sent to patient's pharmacy and referral to sports medicine made as well.

## 2022-12-30 NOTE — Assessment & Plan Note (Signed)
Treat with course of azithromycin, as well as adequate rest and hydration.  Call if symptoms persist or worsen.

## 2022-12-30 NOTE — Patient Instructions (Addendum)
Debrox drops - get these over the counter and take per package instructions to help with wax build up as needed. Do not use for more than 4 days in row

## 2022-12-30 NOTE — Progress Notes (Signed)
Established Patient Office Visit  Subjective   Patient ID: Miranda Fowler, female    DOB: 1981/05/11  Age: 42 y.o. MRN: [id number]  Chief Complaint  Patient presents with   Annual Exam   Up to date on mammogram and pap smear. Due for tdap.  Due for diabetes screening. Knee pain: bilateral, chronic. At times feels her knee will give out R>L. Last imaging done in 2017 showed no abnormality. Takes naproxen as needed without much improvement in symptoms.  Hypothyroidism: Continues on levothyroxine, due to have TSH checked today. Hyperlipidemia: History of elevated cholesterol, due to have checked today. Sinus problem: Reports having congestion, runny nose that seems to wax and wane but never fully resolves.  Has been ongoing for a few weeks. ADHD: Takes Adderall 5 mg daily as needed.  Tolerates medication well.  Does feel benefit from medication.     Review of Systems  Constitutional:  Negative for fever, malaise/fatigue and weight loss.  Respiratory:  Negative for cough and shortness of breath.   Cardiovascular:  Negative for chest pain and palpitations.  Gastrointestinal:  Negative for abdominal pain and blood in stool.  Neurological:  Negative for dizziness, seizures, loss of consciousness and headaches.  Psychiatric/Behavioral:  Negative for depression and suicidal ideas. The patient is not nervous/anxious.       Objective:     BP 116/88   Pulse 60   Temp 98 F (36.7 C) (Oral)   Ht '5\' 3"'$  (1.6 m)   Wt 148 lb 6 oz (67.3 kg)   LMP 11/07/2015   SpO2 99%   BMI 26.28 kg/m  BP Readings from Last 3 Encounters:  12/30/22 116/88  11/23/22 118/80  10/27/22 126/89   Wt Readings from Last 3 Encounters:  12/30/22 148 lb 6 oz (67.3 kg)  11/23/22 152 lb (68.9 kg)  10/27/22 149 lb (67.6 kg)      Physical Exam Vitals reviewed.  Constitutional:      Appearance: Normal appearance.  HENT:     Head: Normocephalic and atraumatic.     Right Ear: External ear normal. There is  impacted cerumen.     Left Ear: Tympanic membrane, ear canal and external ear normal.  Eyes:     General:        Right eye: No discharge.        Left eye: No discharge.     Extraocular Movements: Extraocular movements intact.     Conjunctiva/sclera: Conjunctivae normal.     Pupils: Pupils are equal, round, and reactive to light.  Neck:     Vascular: No carotid bruit.  Cardiovascular:     Rate and Rhythm: Normal rate and regular rhythm.     Pulses: Normal pulses.     Heart sounds: Normal heart sounds. No murmur heard. Pulmonary:     Effort: Pulmonary effort is normal.     Breath sounds: Normal breath sounds.  Chest:     Comments: Deferred per patient preference Abdominal:     General: Abdomen is flat. Bowel sounds are normal. There is no distension.     Palpations: Abdomen is soft. There is no mass.     Tenderness: There is no abdominal tenderness.  Musculoskeletal:        General: No tenderness.     Cervical back: Neck supple. No muscular tenderness.     Right lower leg: No edema.     Left lower leg: No edema.  Lymphadenopathy:     Cervical: No cervical adenopathy.  Upper Body:     Right upper body: No supraclavicular adenopathy.     Left upper body: No supraclavicular adenopathy.  Skin:    General: Skin is warm and dry.  Neurological:     General: No focal deficit present.     Mental Status: She is alert and oriented to person, place, and time.     Motor: No weakness.     Gait: Gait normal.  Psychiatric:        Mood and Affect: Mood normal.        Behavior: Behavior normal.        Judgment: Judgment normal.      No results found for any visits on 12/30/22.    The 10-year ASCVD risk score (Arnett DK, et al., 2019) is: 0.2%    Assessment & Plan:   Problem List Items Addressed This Visit       Respiratory   Acute sinusitis    Treat with course of azithromycin, as well as adequate rest and hydration.  Call if symptoms persist or worsen.      Relevant  Medications   azithromycin (ZITHROMAX) 250 MG tablet     Endocrine   Hypothyroidism    Check TSH, Further recommendations may be made based on these results.        Relevant Orders   Comprehensive metabolic panel   CBC   Hemoglobin A1c   Lipid panel   TSH     Nervous and Auditory   Impacted cerumen of right ear    Patient underwent ear lavage today, tolerated procedure well.  Reports symptoms improved, tympanic membranes visualized after lavage.  No signs of infection noted.  Patient provided information on Debrox drops that she can use as needed for wax buildup.        Other   Chronic pain of both knees    Chronic, bilateral x-rays ordered today.  Refill naproxen sent to patient's pharmacy and referral to sports medicine made as well.      Relevant Medications   naproxen (NAPROSYN) 500 MG tablet   Other Relevant Orders   DG Knee 1-2 Views Left   DG Knee 1-2 Views Right   Ambulatory referral to Sports Medicine   Attention deficit hyperactivity disorder (ADHD)    Continue Adderall 5 mg a mouth daily as needed, [place] controlled [place] reviewed today.  Vital signs stable blood pressure not elevated.  Follow-up in 3 months.      Relevant Medications   amphetamine-dextroamphetamine (ADDERALL) 5 MG tablet   Encounter for routine adult physical exam with abnormal findings - Primary    Encouraged patient to live a healthy lifestyle as well as to notify us if she would like Tdap administered as patient deferred to have this administered today.      Relevant Orders   Comprehensive metabolic panel   CBC   Hemoglobin A1c   Lipid panel   TSH   Hyperlipidemia    Labs ordered today, further recommendations may be made based upon these results.      Relevant Orders   Comprehensive metabolic panel   CBC   Hemoglobin A1c   Lipid panel   TSH   Diabetes mellitus screening    Labs ordered, further recommendations may be made based upon the results.       Relevant Orders   Comprehensive metabolic panel   CBC   Hemoglobin A1c   Lipid panel   TSH    Return in about  3 months (around 04/01/2023) for F/U with Raylynn Hersh.  In addition to performing her annual physical exam also performed office visit as detailed above.    [name], NP

## 2022-12-30 NOTE — Progress Notes (Signed)
PRE-PROCEDURE EXAM: right TM cannot be visualized due to total occlusion/impaction of the ear canal.  PROCEDURE INDICATION: remove wax to visualize ear drum & relieve discomfort  CONSENT:  Verbal     PROCEDURE NOTE:     Right EAR:  I used warm water irrigation under direct visualization with the otoscope to free the wax bolus from the ear canal.    POST- PROCEDURE EXAM:  Right TMs successfully visualized and found to have no erythema     The patient tolerated the procedure well.   

## 2022-12-30 NOTE — Assessment & Plan Note (Signed)
Encouraged patient to live a healthy lifestyle as well as to notify us if she would like Tdap administered as patient deferred to have this administered today.

## 2022-12-31 ENCOUNTER — Telehealth: Payer: Self-pay

## 2022-12-31 NOTE — Telephone Encounter (Signed)
PA request received via CMM for Linzess 145MCG capsules  PA has been submitted to [facility] and is pending determination.  Key: [id number]

## 2023-01-07 NOTE — Progress Notes (Unsigned)
    Miranda Mccreedy D.[place] Phone: [phone]   Assessment and Plan:     There are no diagnoses linked to this encounter.  ***   Pertinent previous records reviewed include ***   Follow Up: ***     Subjective:   I, Miranda Fowler, am serving as a Education administrator for Doctor Glennon Mac  Chief Complaint: bilateral knee pain   HPI:   01/10/2023 Patient is a 42 year old female complaining of bilateral knee pain. Patient states   Relevant Historical Information: ***  Additional pertinent review of systems negative.   Current Outpatient Medications:    amphetamine-dextroamphetamine (ADDERALL) 5 MG tablet, Take 1 tablet by mouth daily as needed for attention and concentration, Disp: 30 tablet, Rfl: 0   cetirizine (ZYRTEC) 10 MG tablet, Take 1 tablet (10 mg total) by mouth daily., Disp: 30 tablet, Rfl: 0   linaclotide (LINZESS) 145 MCG CAPS capsule, Take 1 capsule (145 mcg total) by mouth daily before breakfast., Disp: 30 capsule, Rfl: 2   naproxen (NAPROSYN) 500 MG tablet, Take 1 tablet (500 mg total) by mouth 2 (two) times daily as needed., Disp: 20 tablet, Rfl: 0   SYNTHROID 125 MCG tablet, Take 1 tablet (125 mcg total) by mouth daily before breakfast., Disp: 90 tablet, Rfl: 0   Objective:     There were no vitals filed for this visit.    There is no height or weight on file to calculate BMI.    Physical Exam:    ***   Electronically signed by:  Miranda Fowler [facility] 1:52 PM 01/07/23

## 2023-01-10 ENCOUNTER — Ambulatory Visit (INDEPENDENT_AMBULATORY_CARE_PROVIDER_SITE_OTHER): Payer: 59

## 2023-01-10 ENCOUNTER — Ambulatory Visit: Payer: 59 | Admitting: Sports Medicine

## 2023-01-10 VITALS — BP 124/82 | HR 62 | Ht 63.0 in | Wt 148.0 lb

## 2023-01-10 DIAGNOSIS — G8929 Other chronic pain: Secondary | ICD-10-CM

## 2023-01-10 DIAGNOSIS — M222X2 Patellofemoral disorders, left knee: Secondary | ICD-10-CM | POA: Diagnosis not present

## 2023-01-10 DIAGNOSIS — M25562 Pain in left knee: Secondary | ICD-10-CM | POA: Diagnosis not present

## 2023-01-10 DIAGNOSIS — M25561 Pain in right knee: Secondary | ICD-10-CM | POA: Diagnosis not present

## 2023-01-10 DIAGNOSIS — M222X1 Patellofemoral disorders, right knee: Secondary | ICD-10-CM | POA: Diagnosis not present

## 2023-01-10 MED ORDER — MELOXICAM 15 MG PO TABS: 15.0000 mg | ORAL_TABLET | Freq: Every day | ORAL | 0 refills | Status: DC

## 2023-01-10 NOTE — Patient Instructions (Addendum)
Good to see you  Knee HEP  Pt referral  - Start meloxicam 15 mg daily x2 weeks.  If still having pain after 2 weeks, complete 3rd-week of meloxicam. May use remaining meloxicam as needed once daily for pain control.  Do not to use additional NSAIDs while taking meloxicam.  May use Tylenol [phone] mg 2 to 3 times a day for breakthrough pain. Stop taking naproxen  4 week follow up

## 2023-01-27 ENCOUNTER — Encounter (HOSPITAL_COMMUNITY): Payer: Self-pay | Admitting: Gastroenterology

## 2023-01-27 NOTE — Progress Notes (Signed)
Attempted to obtain medical history via telephone, unable to reach at this time. HIPAA compliant voicemail message left requesting return call to pre surgical testing department. 

## 2023-02-02 NOTE — Anesthesia Preprocedure Evaluation (Signed)
Anesthesia Evaluation  Patient identified by MRN, date of birth, ID band Patient awake    Reviewed: Allergy & Precautions, NPO status , Patient's Chart, lab work & pertinent test results  History of Anesthesia Complications Negative for: history of anesthetic complications  Airway Mallampati: II  TM Distance: >3 FB Neck ROM: Full    Dental  (+) Teeth Intact   Pulmonary neg pulmonary ROS, shortness of breath and with exertion   breath sounds clear to auscultation       Cardiovascular negative cardio ROS  Rhythm:Regular     Neuro/Psych  Headaches, neg Seizures PSYCHIATRIC DISORDERS Anxiety      negative psych ROS   GI/Hepatic negative GI ROS, Neg liver ROS,,,Duodenal polyp   Endo/Other  Hypothyroidism    Renal/GU negative Renal ROS  negative genitourinary   Musculoskeletal  (+) Arthritis , Osteoarthritis,    Abdominal   Peds  Hematology  (+) Blood dyscrasia, anemia   Anesthesia Other Findings   Reproductive/Obstetrics                              Anesthesia Physical Anesthesia Plan  ASA: 2  Anesthesia Plan: MAC   Post-op Pain Management:    Induction: Intravenous  PONV Risk Score and Plan: 2 and Treatment may vary due to age or medical condition  Airway Management Planned: Natural Airway and Nasal Cannula  Additional Equipment: None  Intra-op Plan:   Post-operative Plan: Extubation in OR  Informed Consent: I have reviewed the patients History and Physical, chart, labs and discussed the procedure including the risks, benefits and alternatives for the proposed anesthesia with the patient or authorized representative who has indicated his/her understanding and acceptance.     Dental advisory given  Plan Discussed with: CRNA and Surgeon  Anesthesia Plan Comments:          Anesthesia Quick Evaluation

## 2023-02-03 ENCOUNTER — Ambulatory Visit (HOSPITAL_COMMUNITY)
Admission: RE | Admit: 2023-02-03 | Discharge: 2023-02-03 | Disposition: A | Discharge status: Home / Self Care | Payer: 59 | Source: Ambulatory Visit | Attending: Gastroenterology | Admitting: Gastroenterology

## 2023-02-03 ENCOUNTER — Ambulatory Visit (HOSPITAL_COMMUNITY): Payer: 59 | Admitting: Anesthesiology

## 2023-02-03 ENCOUNTER — Other Ambulatory Visit: Payer: Self-pay

## 2023-02-03 ENCOUNTER — Encounter (HOSPITAL_COMMUNITY)
Admission: RE | Disposition: A | Discharge status: Home / Self Care | Payer: Self-pay | Source: Ambulatory Visit | Attending: Gastroenterology

## 2023-02-03 ENCOUNTER — Encounter (HOSPITAL_COMMUNITY): Payer: Self-pay | Admitting: Gastroenterology

## 2023-02-03 ENCOUNTER — Ambulatory Visit (HOSPITAL_BASED_OUTPATIENT_CLINIC_OR_DEPARTMENT_OTHER): Payer: 59 | Admitting: Anesthesiology

## 2023-02-03 DIAGNOSIS — K2970 Gastritis, unspecified, without bleeding: Secondary | ICD-10-CM | POA: Insufficient documentation

## 2023-02-03 DIAGNOSIS — D509 Iron deficiency anemia, unspecified: Secondary | ICD-10-CM

## 2023-02-03 DIAGNOSIS — D638 Anemia in other chronic diseases classified elsewhere: Secondary | ICD-10-CM

## 2023-02-03 DIAGNOSIS — D132 Benign neoplasm of duodenum: Secondary | ICD-10-CM

## 2023-02-03 DIAGNOSIS — R140 Abdominal distension (gaseous): Secondary | ICD-10-CM

## 2023-02-03 DIAGNOSIS — E039 Hypothyroidism, unspecified: Secondary | ICD-10-CM | POA: Diagnosis not present

## 2023-02-03 DIAGNOSIS — K317 Polyp of stomach and duodenum: Secondary | ICD-10-CM | POA: Diagnosis not present

## 2023-02-03 DIAGNOSIS — R195 Other fecal abnormalities: Secondary | ICD-10-CM | POA: Insufficient documentation

## 2023-02-03 DIAGNOSIS — K638219 Small intestinal bacterial overgrowth, unspecified: Secondary | ICD-10-CM | POA: Diagnosis not present

## 2023-02-03 DIAGNOSIS — F419 Anxiety disorder, unspecified: Secondary | ICD-10-CM

## 2023-02-03 HISTORY — PX: ENTEROSCOPY: SHX5533

## 2023-02-03 SURGERY — ENTEROSCOPY
Anesthesia: Monitor Anesthesia Care

## 2023-02-03 MED ORDER — PROPOFOL 10 MG/ML IV BOLUS
INTRAVENOUS | Status: DC | PRN
  Administered 2023-02-03: 30 mg via INTRAVENOUS

## 2023-02-03 MED ORDER — PANTOPRAZOLE SODIUM 40 MG PO TBEC: 40.0000 mg | DELAYED_RELEASE_TABLET | Freq: Every day | ORAL | 3 refills | Status: DC

## 2023-02-03 MED ORDER — SODIUM CHLORIDE 0.9 % IV SOLN: INTRAVENOUS | Status: DC

## 2023-02-03 MED ORDER — SIMETHICONE 40 MG/0.6ML PO SUSP
40.0000 mg | Freq: Once | ORAL | Status: AC
  Administered 2023-02-03: 40 mg via ORAL

## 2023-02-03 MED ORDER — SIMETHICONE 40 MG/0.6ML PO SUSP (UNIT DOSE)
ORAL | Status: AC
  Filled 2023-02-03: qty 0.6

## 2023-02-03 MED ORDER — HYOSCYAMINE SULFATE 0.125 MG SL SUBL
0.2500 mg | SUBLINGUAL_TABLET | Freq: Once | SUBLINGUAL | Status: AC
  Administered 2023-02-03: 0.25 mg via SUBLINGUAL
  Filled 2023-02-03: qty 2

## 2023-02-03 MED ORDER — DICYCLOMINE HCL 10 MG PO CAPS: 10.0000 mg | ORAL_CAPSULE | Freq: Three times a day (TID) | ORAL | 1 refills | Status: DC | PRN

## 2023-02-03 MED ORDER — ONDANSETRON HCL 4 MG/2ML IJ SOLN
4.0000 mg | Freq: Once | INTRAMUSCULAR | Status: AC
  Administered 2023-02-03: 4 mg via INTRAVENOUS

## 2023-02-03 MED ORDER — PROPOFOL 1000 MG/100ML IV EMUL
INTRAVENOUS | Status: AC
  Filled 2023-02-03: qty 100

## 2023-02-03 MED ORDER — HYDROCODONE-ACETAMINOPHEN 5-325 MG PO TABS
1.0000 | ORAL_TABLET | Freq: Once | ORAL | Status: AC
  Administered 2023-02-03: 1 via ORAL
  Filled 2023-02-03: qty 1

## 2023-02-03 MED ORDER — LIDOCAINE 2% (20 MG/ML) 5 ML SYRINGE
INTRAMUSCULAR | Status: DC | PRN
  Administered 2023-02-03: 60 mg via INTRAVENOUS

## 2023-02-03 MED ORDER — AMISULPRIDE (ANTIEMETIC) 5 MG/2ML IV SOLN: 10.0000 mg | Freq: Once | INTRAVENOUS | Status: DC | PRN

## 2023-02-03 MED ORDER — ONDANSETRON HCL 4 MG/2ML IJ SOLN
INTRAMUSCULAR | Status: AC
  Filled 2023-02-03: qty 2

## 2023-02-03 MED ORDER — PROPOFOL 500 MG/50ML IV EMUL
INTRAVENOUS | Status: DC | PRN
  Administered 2023-02-03: 150 ug/kg/min via INTRAVENOUS

## 2023-02-03 MED ORDER — PROPOFOL 500 MG/50ML IV EMUL
INTRAVENOUS | Status: AC
  Filled 2023-02-03: qty 50

## 2023-02-03 MED ORDER — PROPOFOL 500 MG/50ML IV EMUL
INTRAVENOUS | Status: AC
  Filled 2023-02-03: qty 100

## 2023-02-03 MED ORDER — HYDROCODONE-ACETAMINOPHEN 5-325 MG PO TABS
ORAL_TABLET | ORAL | Status: AC
  Filled 2023-02-03: qty 1

## 2023-02-03 MED ORDER — LACTATED RINGERS IV SOLN
INTRAVENOUS | Status: DC
  Administered 2023-02-03: 1000 mL via INTRAVENOUS

## 2023-02-03 NOTE — Discharge Instructions (Signed)
YOU HAD AN ENDOSCOPIC PROCEDURE TODAY: Refer to the procedure report and other information in the discharge instructions given to you for any specific questions about what was found during the examination. If this information does not answer your questions, please call [place] office at [phone] to clarify.  ° °YOU SHOULD EXPECT: Some feelings of bloating in the abdomen. Passage of more gas than usual. Walking can help get rid of the air that was put into your GI tract during the procedure and reduce the bloating.  ° °DIET: Your first meal following the procedure should be a light meal and then it is ok to progress to your normal diet. A half-sandwich or bowl of soup is an example of a good first meal. Heavy or fried foods are harder to digest and may make you feel nauseous or bloated. Drink plenty of fluids but you should avoid alcoholic beverages for 24 hours. ° °ACTIVITY: Your care partner should take you home directly after the procedure. You should plan to take it easy, moving slowly for the rest of the day. You can resume normal activity the day after the procedure however YOU SHOULD NOT DRIVE, use power tools, machinery or perform tasks that involve climbing or major physical exertion for 24 hours (because of the sedation medicines used during the test).  ° °SYMPTOMS TO REPORT IMMEDIATELY: °A gastroenterologist can be reached at any hour. Please call [phone]  for any of the following symptoms:  ° °Following upper endoscopy (EGD, EUS, ERCP, esophageal dilation) °Vomiting of blood or coffee ground material  °New, significant abdominal pain  °New, significant chest pain or pain under the shoulder blades  °Painful or persistently difficult swallowing  °New shortness of breath  °Black, tarry-looking or red, bloody stools ° °FOLLOW UP:  °If any biopsies were taken you will be contacted by phone or by letter within the next 1-3 weeks. Call [phone]  if you have not heard about the biopsies in 3 weeks.    °Please also call with any specific questions about appointments or follow up tests. ° °

## 2023-02-03 NOTE — H&P (Signed)
[facility] History and Physical   Primary Care Physician:  [name], NP   Reason for Procedure:   Duodenal polyp  Plan:    Push enteroscopy with possible polypectomy     HPI: Miranda Fowler is a 42 y.o. female here for enteroscopy for evaluation and removal of duodenal polyp  The risks and benefits as well as alternatives of endoscopic procedure(s) have been discussed and reviewed. All questions answered. The patient agrees to proceed.    Past Medical History:  Diagnosis Date   Anemia    Chronic problem for patient.  Followed by Heme, receives IV iron transfusions secondary to inability to tolerate PO supplements.     Arthritis    bilateral knees   Headache    Migraines   Hypothyroidism    post-radiation at age 89   Shortness of breath dyspnea    Vaginal high risk HPV DNA test positive 07/20/2021    Past Surgical History:  Procedure Laterality Date   BILATERAL SALPINGECTOMY Bilateral 2008   pt denies   BREAST BIOPSY Right 10/05/2022   MM RT BREAST BX W LOC DEV 1ST LESION IMAGE BX SPEC STEREO GUIDE 10/05/2022 [facility]   BREAST BIOPSY Left 10/05/2022   MM LT BREAST BX W LOC DEV 1ST LESION IMAGE BX SPEC STEREO GUIDE 10/05/2022 [facility]   CESAREAN SECTION     CYSTOSCOPY N/A 04/06/2016   Procedure: CYSTOSCOPY;  Surgeon: Ok Edwards, MD;  Location: [facility];  Service: Gynecology;  Laterality: N/A;   LAPAROSCOPIC ASSISTED VAGINAL HYSTERECTOMY N/A 04/06/2016   Procedure: LAPAROSCOPIC ASSISTED VAGINAL HYSTERECTOMY,  Bilateral Salpingectomy;  Surgeon: Ok Edwards, MD;  Location: [facility];  Service: Gynecology;  Laterality: N/A;   TUBAL LIGATION      Prior to Admission medications   Medication Sig Start Date End Date Taking? Authorizing Provider  amphetamine-dextroamphetamine (ADDERALL) 5 MG tablet Take 1 tablet by mouth daily as needed for attention and concentration 12/30/22  Yes [name], NP  linaclotide [facility]) 145 MCG  CAPS capsule Take 1 capsule (145 mcg total) by mouth daily before breakfast. Patient taking differently: Take 145 mcg by mouth daily as needed (constipation). 09/28/22  Yes Unk Lightning, PA  SYNTHROID 125 MCG tablet Take 1 tablet (125 mcg total) by mouth daily before breakfast. 12/02/22  Yes [name], NP  cetirizine (ZYRTEC) 10 MG tablet Take 1 tablet (10 mg total) by mouth daily. Patient not taking: Reported on 02/01/2023 03/24/22   Gustavus Bryant, FNP  meloxicam (MOBIC) 15 MG tablet Take 1 tablet (15 mg total) by mouth daily. Patient not taking: Reported on 02/01/2023 01/10/23   Richardean Sale, DO  naproxen (NAPROSYN) 500 MG tablet Take 1 tablet (500 mg total) by mouth 2 (two) times daily as needed. Patient not taking: Reported on 02/01/2023 12/30/22   [name], NP    Current Facility-Administered Medications  Medication Dose Route Frequency Provider Last Rate Last Admin   0.9 %  sodium chloride infusion   Intravenous Continuous Senaida Chilcote, Eleonore Chiquito, MD       lactated ringers infusion   Intravenous Continuous Napoleon Form, MD 10 mL/hr at 02/03/23 0653 1,000 mL at 02/03/23 0653    Allergies as of 11/23/2022   (No Known Allergies)    Family History  Problem Relation Age of Onset   Anemia Mother    Lupus Mother    Sickle cell trait Mother    Hypertension Father    Sickle  cell trait Sister    Breast cancer Paternal Aunt    Cancer Paternal Aunt        breast   Cancer Paternal Aunt        cervical   Colon cancer Paternal Uncle    Diabetes Maternal Grandmother    Diabetes Maternal Grandfather    Heart disease Paternal Grandmother     Social History   Socioeconomic History   Marital status: Married    Spouse name: Not on file   Number of children: Not on file   Years of education: Not on file   Highest education level: Not on file  Occupational History   Not on file  Tobacco Use   Smoking status: Never   Smokeless tobacco: Never  Vaping Use   Vaping  Use: Never used  Substance and Sexual Activity   Alcohol use: No    Alcohol/week: 0.0 standard drinks of alcohol    Comment: wine   Drug use: No   Sexual activity: Yes    Birth control/protection: Surgical    Comment: TUBAL-LIAGTION  Other Topics Concern   Not on file  Social History Narrative   Married, 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  10/2016   Social Determinants of Health   Financial Resource Strain: Not on file  Food Insecurity: Not on file  Transportation Needs: Not on file  Physical Activity: Not on file  Stress: Not on file  Social Connections: Not on file  Intimate Partner Violence: Not on file    Review of Systems:  All other review of systems negative except as mentioned in the HPI.  Physical Exam: Vital signs in last 24 hours: Temp:  [97.8 F (36.6 C)] 97.8 F (36.6 C) (04/18 0645) Pulse Rate:  [62] 62 (04/18 0645) Resp:  [15] 15 (04/18 0645) BP: (133)/(83) 133/83 (04/18 0645) SpO2:  [100 %] 100 % (04/18 0645) Weight:  [67.1 kg] 67.1 kg (04/18 0645)   General:   Alert, NAD Lungs:  Clear .   Heart:  Regular rate and rhythm Abdomen:  Soft, nontender and nondistended. Neuro/Psych:  Alert and cooperative. Normal mood and affect. A and O x 3   K. Scherry Ran , MD [phone]

## 2023-02-03 NOTE — Anesthesia Postprocedure Evaluation (Signed)
Anesthesia Post Note  Patient: Miranda Fowler  Procedure(s) Performed: ENTEROSCOPY     Patient location during evaluation: PACU Anesthesia Type: MAC Level of consciousness: awake and alert and oriented Pain management: pain level controlled Vital Signs Assessment: post-procedure vital signs reviewed and stable Respiratory status: spontaneous breathing, nonlabored ventilation and respiratory function stable Cardiovascular status: stable and blood pressure returned to baseline Postop Assessment: no apparent nausea or vomiting Anesthetic complications: no   No notable events documented.  Last Vitals:  Vitals:   02/03/23 0811 02/03/23 0814  BP: (!) 146/98   Pulse: 80 75  Resp: 16 (!) 24  Temp:    SpO2: 100% 100%    Last Pain:  Vitals:   02/03/23 0814  TempSrc:   PainSc: 5                  Othniel Maret A.

## 2023-02-03 NOTE — Progress Notes (Signed)
Pt presented to endoscopy today for enteroscopy with MD Nandigam. In recovery, pt endorsed 5/10 abdominal pain over the RUQ. Pt described pain as "aching" and believed it to be gas pain. MD Nandigam notified. VO for 0.25 mg levsin SL and 40 mg simethicone PO. Medications given as ordered (see MAR).   Pt then began endorsing weakness and general malaise, stating "I feel weak and don't feel well". MD Nandigam notified and assessed the pt at the bedside.   Approximately 5-10 minutes later, the patient began c/o nausea. MDA Foster notified. VO for 4 mg Zofran IV push and 10 mg Barhemsys IV push if nausea not relieved by Zofran. Pt able to tolerate PO liquids.  Medication given as ordered (see MAR).   Pt denies abdominal pain as of 0845. As of 0850, pt endorses improvement in nausea post-Zofran.  As of 0900, pt c/o headache. MDA Foster notified. VO for 5/325 mg of hydrocodone-acetaminophen PO. MD Nandigam updated. Hydrocodone-acetaminophen given at 0918 per order.  On reassessment, pt rates headache at 3/10. Pt states this is a manageable level of pain. MDA Foster notified. MDA Foster okay with discharge. Pt discharged to home. Discharge instructions given to pt and care partner.  Eulas Post, RN 02/03/23 10:05 AM

## 2023-02-03 NOTE — Transfer of Care (Signed)
Immediate Anesthesia Transfer of Care Note  Patient: Miranda Fowler  Procedure(s) Performed: ENTEROSCOPY  Patient Location: Endoscopy Unit  Anesthesia Type:MAC  Level of Consciousness: awake, alert , and oriented  Airway & Oxygen Therapy: Patient Spontanous Breathing and Patient connected to face mask oxygen  Post-op Assessment: Report given to RN and Post -op Vital signs reviewed and stable  Post vital signs: Reviewed and stable  Last Vitals:  Vitals Value Taken Time  BP 146/98   Temp    Pulse 80 02/03/23 0811  Resp 16 02/03/23 0811  SpO2 100 % 02/03/23 0811  Vitals shown include unvalidated device data.  Last Pain:  Vitals:   02/03/23 0645  TempSrc: Tympanic  PainSc: 0-No pain         Complications: No notable events documented.

## 2023-02-03 NOTE — Op Note (Signed)
[facility] Patient Name: Miranda Fowler Procedure Date: 02/03/2023 MRN: [id number] Attending MD: Napoleon Form , MD, [id number] Date of Birth: Jun 23, 1981 CSN: [id number] Age: 42 Admit Type: Outpatient Procedure:                Small bowel enteroscopy Indications:              Occult blood in stool, Suspected polyp in the                            duodenum, seen on small bowel video capsule Providers:                Napoleon Form, MD, Trudee Kuster, RN, Irene Shipper, Technician Referring MD:              Medicines:                Propofol per Anesthesia Complications:            No immediate complications. Estimated Blood Loss:     Estimated blood loss was minimal. Procedure:                Pre-Anesthesia Assessment:                           - Prior to the procedure, a History and Physical                            was performed, and patient medications and                            allergies were reviewed. The patient's tolerance of                            previous anesthesia was also reviewed. The risks                            and benefits of the procedure and the sedation                            options and risks were discussed with the patient.                            All questions were answered, and informed consent                            was obtained. Prior Anticoagulants: The patient has                            taken no anticoagulant or antiplatelet agents. ASA                            Grade Assessment: II - A patient with mild systemic  disease. After reviewing the risks and benefits,                            the patient was deemed in satisfactory condition to                            undergo the procedure.                           After obtaining informed consent, the endoscope was                            passed under direct vision. Throughout the                             procedure, the patient's blood pressure, pulse, and                            oxygen saturations were monitored continuously. The                            [id number]-HQ190L ([id number]) Olympus colonoscope was                            introduced through the mouth and advanced to the                            proximal jejunum. The small bowel enteroscopy was                            accomplished without difficulty. The patient                            tolerated the procedure well. Scope In: Scope Out: Findings:      The esophagus was normal.      Patchy mild inflammation characterized by congestion (edema), erythema       and friability was found in the entire examined stomach.      The examined duodenum was normal.      There was no evidence of significant pathology in the entire examined       portion of jejunum. Impression:               - Normal esophagus.                           - Gastritis.                           - Normal examined duodenum.                           - The examined portion of the jejunum was normal.                           - No specimens collected. Recommendation:           -  Discharge patient to home.                           - Resume previous diet.                           - Use Protonix (pantoprazole) 40 mg PO daily.                           - Continue present medications.                           - Return to GI office PRN.                           - No high dose aspirin, ibuprofen, naproxen, or                            other non-steroidal anti-inflammatory drugs. Procedure Code(s):        --- Professional ---                           [phone], Small intestinal endoscopy, enteroscopy                            beyond second portion of duodenum, not including                            ileum; diagnostic, including collection of                            specimen(s) by brushing or washing, when performed                             (separate procedure) Diagnosis Code(s):        --- Professional ---                           K29.70, Gastritis, unspecified, without bleeding                           R19.5, Other fecal abnormalities CPT copyright 2022 [employer]. All rights reserved. The codes documented in this report are preliminary and upon coder review may  be revised to meet current compliance requirements. Napoleon Form, MD 02/03/2023 8:24:46 AM This report has been signed electronically. Number of Addenda: 0

## 2023-02-06 ENCOUNTER — Other Ambulatory Visit: Payer: Self-pay | Admitting: Sports Medicine

## 2023-02-07 ENCOUNTER — Ambulatory Visit: Payer: 59 | Admitting: Sports Medicine

## 2023-02-07 ENCOUNTER — Ambulatory Visit: Payer: 59 | Admitting: Family Medicine

## 2023-02-07 ENCOUNTER — Encounter (HOSPITAL_COMMUNITY): Payer: Self-pay | Admitting: Gastroenterology

## 2023-02-07 VITALS — BP 104/70 | HR 68 | Temp 98.0°F | Resp 20 | Ht 63.0 in | Wt 147.0 lb

## 2023-02-07 DIAGNOSIS — J988 Other specified respiratory disorders: Secondary | ICD-10-CM

## 2023-02-07 DIAGNOSIS — J302 Other seasonal allergic rhinitis: Secondary | ICD-10-CM

## 2023-02-07 DIAGNOSIS — R062 Wheezing: Secondary | ICD-10-CM

## 2023-02-07 DIAGNOSIS — B9689 Other specified bacterial agents as the cause of diseases classified elsewhere: Secondary | ICD-10-CM

## 2023-02-07 MED ORDER — LEVOCETIRIZINE DIHYDROCHLORIDE 5 MG PO TABS
5.0000 mg | ORAL_TABLET | Freq: Every evening | ORAL | 2 refills | Status: DC
Start: 2023-02-07 — End: 2023-12-21

## 2023-02-07 MED ORDER — ALBUTEROL SULFATE HFA 108 (90 BASE) MCG/ACT IN AERS
2.0000 | INHALATION_SPRAY | Freq: Four times a day (QID) | RESPIRATORY_TRACT | 0 refills | Status: DC | PRN
Start: 2023-02-07 — End: 2023-10-20

## 2023-02-07 MED ORDER — AZITHROMYCIN 250 MG PO TABS
ORAL_TABLET | ORAL | 0 refills | Status: AC
Start: 2023-02-07 — End: 2023-02-12

## 2023-02-07 NOTE — Progress Notes (Signed)
Assessment & Plan:  1. Bacterial respiratory infection Throat lozenges, chloraseptic spray, warm salt water gargles, hot tea/honey, cough syrup (Delsym), Tylenol/Ibuprofen, Vicks, and a humidifier at night.  - azithromycin (ZITHROMAX) 250 MG tablet; Take 2 tablets (500 mg total) by mouth daily for 1 day, THEN 1 tablet (250 mg total) daily for 4 days.  Dispense: 6 each; Refill: 0  2. Seasonal allergies Education provided on allergies.  Encouraged to continue Flonase and start Xyzal. - levocetirizine (XYZAL) 5 MG tablet; Take 1 tablet (5 mg total) by mouth every evening.  Dispense: 30 tablet; Refill: 2  3. Wheezing - albuterol (VENTOLIN HFA) 108 (90 Base) MCG/ACT inhaler; Inhale 2 puffs into the lungs every 6 (six) hours as needed for wheezing or shortness of breath.  Dispense: 18 g; Refill: 0    Follow up plan: Return if symptoms worsen or fail to improve.  Miranda Boston, MSN, APRN, FNP-C  Subjective:  HPI: Miranda Fowler is a 42 y.o. female presenting on 02/07/2023 for Cough (Cough, runny nose =clear, facial pressure and HA x 3 weeks /No fever, no body aches and no GI upset )  Patient complains of cough, headache, runny nose, facial pain/pressure, postnasal drainage, and wheezing. She denies sore throat, fever, shortness of breath, nausea, vomiting, abdominal pain, and diarrhea. Onset of symptoms was 3 weeks ago, gradually worsening since that time. She is drinking plenty of fluids. Evaluation to date: none. Treatment to date:  Flonase, Mucinex all in one and Mucinex-D . She does not smoke.    ROS: Negative unless specifically indicated above in HPI.   Relevant past medical history reviewed and updated as indicated.   Allergies and medications reviewed and updated.   Current Outpatient Medications:    dicyclomine (BENTYL) 10 MG capsule, Take 1 capsule (10 mg total) by mouth 3 (three) times daily as needed for spasms., Disp: 90 capsule, Rfl: 1   pantoprazole (PROTONIX) 40 MG  tablet, Take 1 tablet (40 mg total) by mouth daily., Disp: 90 tablet, Rfl: 3   SYNTHROID 125 MCG tablet, Take 1 tablet (125 mcg total) by mouth daily before breakfast., Disp: 90 tablet, Rfl: 0   amphetamine-dextroamphetamine (ADDERALL) 5 MG tablet, Take 1 tablet by mouth daily as needed for attention and concentration (Patient not taking: Reported on 02/07/2023), Disp: 30 tablet, Rfl: 0   cetirizine (ZYRTEC) 10 MG tablet, Take 1 tablet (10 mg total) by mouth daily. (Patient not taking: Reported on 02/01/2023), Disp: 30 tablet, Rfl: 0   linaclotide (LINZESS) 145 MCG CAPS capsule, Take 1 capsule (145 mcg total) by mouth daily before breakfast. (Patient not taking: Reported on 02/07/2023), Disp: 30 capsule, Rfl: 2  No Known Allergies  Objective:   BP 104/70   Pulse 68   Temp 98 F (36.7 C)   Resp 20   Ht  (1.6 m)   Wt 147 lb (66.7 kg)   LMP 11/07/2015   BMI 26.04 kg/m    Physical Exam Vitals reviewed.  Constitutional:      General: She is not in acute distress.    Appearance: Normal appearance. She is not ill-appearing, toxic-appearing or diaphoretic.  HENT:     Head: Normocephalic and atraumatic.     Right Ear: Tympanic membrane, ear canal and external ear normal. There is no impacted cerumen.     Left Ear: Tympanic membrane, ear canal and external ear normal. There is no impacted cerumen.     Nose: Congestion present. No rhinorrhea.  Right Turbinates: Swollen and pale.     Left Turbinates: Swollen and pale.     Right Sinus: No maxillary sinus tenderness or frontal sinus tenderness.     Left Sinus: No maxillary sinus tenderness or frontal sinus tenderness.     Mouth/Throat:     Mouth: Mucous membranes are moist.     Pharynx: Oropharynx is clear. No oropharyngeal exudate or posterior oropharyngeal erythema.  Eyes:     General: No scleral icterus.       Right eye: No discharge.        Left eye: No discharge.     Conjunctiva/sclera: Conjunctivae normal.  Cardiovascular:      Rate and Rhythm: Normal rate and regular rhythm.     Heart sounds: Normal heart sounds. No murmur heard.    No friction rub. No gallop.  Pulmonary:     Effort: Pulmonary effort is normal. No respiratory distress.     Breath sounds: No stridor. Examination of the right-lower field reveals wheezing. Wheezing present. No rhonchi or rales.  Musculoskeletal:        General: Normal range of motion.     Cervical back: Normal range of motion.  Lymphadenopathy:     Cervical: No cervical adenopathy.  Skin:    General: Skin is warm and dry.     Capillary Refill: Capillary refill takes less than 2 seconds.  Neurological:     General: No focal deficit present.     Mental Status: She is alert and oriented to person, place, and time. Mental status is at baseline.  Psychiatric:        Mood and Affect: Mood normal.        Behavior: Behavior normal.        Thought Content: Thought content normal.        Judgment: Judgment normal.

## 2023-02-07 NOTE — Patient Instructions (Signed)
Throat lozenges, chloraseptic spray, warm salt water gargles, hot tea/honey, cough syrup (Delsym), Tylenol/Ibuprofen, Vicks, and a humidifier at night.  

## 2023-02-20 DIAGNOSIS — M25512 Pain in left shoulder: Secondary | ICD-10-CM | POA: Diagnosis not present

## 2023-02-20 DIAGNOSIS — G8929 Other chronic pain: Secondary | ICD-10-CM | POA: Diagnosis not present

## 2023-02-25 ENCOUNTER — Other Ambulatory Visit: Payer: Self-pay | Admitting: Gastroenterology

## 2023-03-01 ENCOUNTER — Other Ambulatory Visit: Payer: Self-pay | Admitting: Nurse Practitioner

## 2023-03-01 DIAGNOSIS — Z8639 Personal history of other endocrine, nutritional and metabolic disease: Secondary | ICD-10-CM

## 2023-03-01 DIAGNOSIS — E039 Hypothyroidism, unspecified: Secondary | ICD-10-CM

## 2023-03-03 ENCOUNTER — Other Ambulatory Visit: Payer: Self-pay | Admitting: Oncology

## 2023-03-03 DIAGNOSIS — D649 Anemia, unspecified: Secondary | ICD-10-CM

## 2023-03-03 NOTE — Progress Notes (Deleted)
[facility] Cancer Follow up Visit:  Patient Care Team: Miranda Paddy, NP as PCP - General (Nurse Practitioner) Benjiman Core, MD (Hematology and Oncology)  CHIEF COMPLAINTS/PURPOSE OF CONSULTATION:  HISTORY OF PRESENTING ILLNESS: Miranda Fowler 42 y.o. female is here because of  anemia Medical history notable for ADD, mild concussion, partial hysterectomy.  April 02, 2022 WBC 4.1 hemoglobin 10.7 MCV 100.8 platelet count 235; 48 segs 38 lymphs 14 monos Smear showed normocytic anemia with slight polychromasia and rare elliptocytes Sed rate 29 CRP <1.0 CCP 20 (weak positive) RF < 14  August 05, 2022 WBC 3.7 hemoglobin 10.9 MCV 100.8 platelet count 226.  Hemoglobin fractionation cascade showed normal hemoglobin Ferritin 184 B12 323 folate 13.9 BMP notable for glucose of 100  August 27 2022:  [facility]  Here to transfer hematology care.   Patient is G5 P4014.  Last pregnancy was in 2008.  Underwent hysterectomy in 2018 for management of menorrhagia.  No history of postpartum hemorrhage or postoperative hemorrhage.    Has tried low dose iron in the past and couldn't tolerate it due to nausea and emesis.  Has received IV iron in the past with last treatment about 10 yrs ago.  Last transfusion of PRBC's was in 2017 this was due to heavy menstrual bleeding.  No reaction to IV iron.  Has has a normal diet.  No hematochezia, melena, hemoptysis, hematuria.  No  history of intra-articular or soft tissue bleeding.  No history of abnormal bleeding in family members.  No PICA to ice/starch/dirt.    Social:  Married.  Biomedical scientist.  Tobacco none.  EtOH occasional glass of wine  [facility] Mother alive 36 RA, thyroid disease Father alive 6 PTSD Sister alive 63 lupus, MS, sickle cell trait Brother alive 77 well Brother alive 77 well  WBC 3.6 hemoglobin 12.2 MCV 100 platelet count 275; 36 segs 51 lymphs 12 monos 1 basophil INR 1.1 PTT 34 factor VIII 116 von  Willebrand factor antigen 137 ristocetin cofactor 67% Ferritin 242  February 03, 2023: EGD demonstrated normal esophagus.  Gastritis, normal duodenum.  Examined portions of the jejunum normal  Review of Systems - Oncology  MEDICAL HISTORY: Past Medical History:  Diagnosis Date   Anemia    Chronic problem for patient.  Followed by Heme, receives IV iron transfusions secondary to inability to tolerate PO supplements.     Arthritis    bilateral knees   Headache    Migraines   Hypothyroidism    post-radiation at age 12   Shortness of breath dyspnea    Vaginal high risk HPV DNA test positive 07/20/2021    SURGICAL HISTORY: Past Surgical History:  Procedure Laterality Date   BILATERAL SALPINGECTOMY Bilateral 2008   pt denies   BREAST BIOPSY Right 10/05/2022   MM RT BREAST BX W LOC DEV 1ST LESION IMAGE BX SPEC STEREO GUIDE 10/05/2022 [facility]   BREAST BIOPSY Left 10/05/2022   MM LT BREAST BX W LOC DEV 1ST LESION IMAGE BX SPEC STEREO GUIDE 10/05/2022 [facility]   CESAREAN SECTION     CYSTOSCOPY N/A 04/06/2016   Procedure: CYSTOSCOPY;  Surgeon: Ok Edwards, MD;  Location: [facility];  Service: Gynecology;  Laterality: N/A;   ENTEROSCOPY N/A 02/03/2023   Procedure: ENTEROSCOPY;  Surgeon: Napoleon Form, MD;  Location: [facility];  Service: Gastroenterology;  Laterality: N/A;  Push-enteroscopy   LAPAROSCOPIC ASSISTED VAGINAL HYSTERECTOMY N/A 04/06/2016   Procedure: LAPAROSCOPIC ASSISTED VAGINAL HYSTERECTOMY,  Bilateral Salpingectomy;  Surgeon: Ok Edwards, MD;  Location: [facility];  Service: Gynecology;  Laterality: N/A;   TUBAL LIGATION      SOCIAL HISTORY: Social History   Socioeconomic History   Marital status: Married    Spouse name: Not on file   Number of children: Not on file   Years of education: Not on file   Highest education level: Not on file  Occupational History   Not on file  Tobacco Use   Smoking status: Never   Smokeless tobacco: Never   Vaping Use   Vaping Use: Never used  Substance and Sexual Activity   Alcohol use: No    Alcohol/week: 0.0 standard drinks of alcohol    Comment: wine   Drug use: No   Sexual activity: Yes    Birth control/protection: Surgical    Comment: TUBAL-LIAGTION  Other Topics Concern   Not on file  Social History Narrative   Married, 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  10/2016   Social Determinants of Health   Financial Resource Strain: Not on file  Food Insecurity: Not on file  Transportation Needs: Not on file  Physical Activity: Not on file  Stress: Not on file  Social Connections: Not on file  Intimate Partner Violence: Not on file    FAMILY HISTORY Family History  Problem Relation Age of Onset   Anemia Mother    Lupus Mother    Sickle cell trait Mother    Hypertension Father    Sickle cell trait Sister    Breast cancer Paternal Aunt    Cancer Paternal Aunt        breast   Cancer Paternal Aunt        cervical   Colon cancer Paternal Uncle    Diabetes Maternal Grandmother    Diabetes Maternal Grandfather    Heart disease Paternal Grandmother     ALLERGIES:  has No Known Allergies.  MEDICATIONS:  Current Outpatient Medications  Medication Sig Dispense Refill   albuterol (VENTOLIN HFA) 108 (90 Base) MCG/ACT inhaler Inhale 2 puffs into the lungs every 6 (six) hours as needed for wheezing or shortness of breath. 18 g 0   amphetamine-dextroamphetamine (ADDERALL) 5 MG tablet Take 1 tablet by mouth daily as needed for attention and concentration (Patient not taking: Reported on 02/07/2023) 30 tablet 0   dicyclomine (BENTYL) 10 MG capsule TAKE 1 CAPSULE (10 MG TOTAL) BY MOUTH 3 (THREE) TIMES DAILY AS NEEDED FOR SPASMS. 270 capsule 1   levocetirizine (XYZAL) 5 MG tablet Take 1 tablet (5 mg total) by mouth every evening. 30 tablet 2   linaclotide (LINZESS) 145 MCG CAPS capsule Take 1 capsule (145 mcg total) by mouth daily before breakfast. (Patient not taking:  Reported on 02/07/2023) 30 capsule 2   pantoprazole (PROTONIX) 40 MG tablet Take 1 tablet (40 mg total) by mouth daily. 90 tablet 3   SYNTHROID 125 MCG tablet TAKE 1 TABLET BY MOUTH EVERY DAY BEFORE BREAKFAST 30 tablet 5   No current facility-administered medications for this visit.    PHYSICAL EXAMINATION:  ECOG PERFORMANCE STATUS: 0 - Asymptomatic   There were no vitals filed for this visit.   There were no vitals filed for this visit.    Physical Exam Vitals and nursing note reviewed.  Constitutional:      Appearance: Normal appearance. She is not toxic-appearing or diaphoretic.     Comments: Here alone.  Looks well  HENT:  Head: Normocephalic and atraumatic.     Right Ear: External ear normal.     Left Ear: External ear normal.     Nose: Nose normal. No congestion or rhinorrhea.  Eyes:     General: No scleral icterus.    Extraocular Movements: Extraocular movements intact.     Conjunctiva/sclera: Conjunctivae normal.     Pupils: Pupils are equal, round, and reactive to light.  Cardiovascular:     Rate and Rhythm: Normal rate and regular rhythm.     Heart sounds: No murmur heard.    No friction rub. No gallop.  Pulmonary:     Effort: Pulmonary effort is normal. No respiratory distress.     Breath sounds: Normal breath sounds. No stridor. No wheezing.  Abdominal:     General: Bowel sounds are normal.     Palpations: Abdomen is soft.     Tenderness: There is no abdominal tenderness. There is no guarding or rebound.  Musculoskeletal:        General: No swelling, tenderness or deformity.     Cervical back: Normal range of motion and neck supple. No rigidity or tenderness.  Lymphadenopathy:     Head:     Right side of head: No submental, submandibular, tonsillar, preauricular, posterior auricular or occipital adenopathy.     Left side of head: No submental, submandibular, tonsillar, preauricular, posterior auricular or occipital adenopathy.     Cervical: No  cervical adenopathy.     Right cervical: No superficial, deep or posterior cervical adenopathy.    Left cervical: No superficial, deep or posterior cervical adenopathy.     Upper Body:     Right upper body: No supraclavicular, axillary, pectoral or epitrochlear adenopathy.     Left upper body: No supraclavicular, axillary, pectoral or epitrochlear adenopathy.  Skin:    General: Skin is warm.     Coloration: Skin is not jaundiced or pale.     Findings: No bruising or erythema.  Neurological:     General: No focal deficit present.     Mental Status: She is alert and oriented to person, place, and time.     Cranial Nerves: No cranial nerve deficit.     Gait: Gait normal.  Psychiatric:        Mood and Affect: Mood normal.        Behavior: Behavior normal.        Thought Content: Thought content normal.        Judgment: Judgment normal.      LABORATORY DATA: I have personally reviewed the data as listed:  No visits with results within 1 Month(s) from this visit.  Latest known visit with results is:  Office Visit on 12/30/2022  Component Date Value Ref Range Status   TSH 12/30/2022 0.96  0.35 - 5.50 uIU/mL Final   Cholesterol 12/30/2022 172  0 - 200 mg/dL Final   ATP III Classification       Desirable:  < 200 mg/dL               Borderline High:  200 - 239 mg/dL          High:  > = 253 mg/dL   Triglycerides 66/44/0347 30.0  0.0 - 149.0 mg/dL Final   Normal:  <425 mg/dLBorderline High:  150 - 199 mg/dL   HDL 95/63/8756 43.32  >39.00 mg/dL Final   VLDL 95/18/8416 6.0  0.0 - 60.6 mg/dL Final   LDL Cholesterol 12/30/2022 90  0 - 99 mg/dL Final  Total CHOL/HDL Ratio 12/30/2022 2   Final                  Men          Women1/2 Average Risk     3.4          3.3Average Risk          5.0          4.42X Average Risk          9.6          7.13X Average Risk          15.0          11.0                       NonHDL 12/30/2022 95.80   Final   NOTE:  Non-HDL goal should be 30 mg/dL higher than  patient's LDL goal (i.e. LDL goal of < 70 mg/dL, would have non-HDL goal of < 100 mg/dL)   Hgb Z3Y MFr Bld 86/57/8469 5.2  4.6 - 6.5 % Final   Glycemic Control Guidelines for People with Diabetes:Non Diabetic:  <6%Goal of Therapy: <7%Additional Action Suggested:  >8%    WBC 12/30/2022 3.5 (L)  4.0 - 10.5 K/uL Final   RBC 12/30/2022 3.37 (L)  3.87 - 5.11 Mil/uL Final   Platelets 12/30/2022 275.0  150.0 - 400.0 K/uL Final   Hemoglobin 12/30/2022 11.8 (L)  12.0 - 15.0 g/dL Final   HCT 62/95/2841 34.4 (L)  36.0 - 46.0 % Final   MCV 12/30/2022 102.2 (H)  78.0 - 100.0 fl Final   MCHC 12/30/2022 34.3  30.0 - 36.0 g/dL Final   RDW 32/44/0102 13.0  11.5 - 15.5 % Final   Sodium 12/30/2022 137  135 - 145 mEq/L Final   Potassium 12/30/2022 3.8  3.5 - 5.1 mEq/L Final   Chloride 12/30/2022 102  96 - 112 mEq/L Final   CO2 12/30/2022 29  19 - 32 mEq/L Final   Glucose, Bld 12/30/2022 82  70 - 99 mg/dL Final   BUN 72/53/6644 12  6 - 23 mg/dL Final   Creatinine, Ser 12/30/2022 0.79  0.40 - 1.20 mg/dL Final   Total Bilirubin 12/30/2022 0.8  0.2 - 1.2 mg/dL Final   Alkaline Phosphatase 12/30/2022 71  39 - 117 U/L Final   AST 12/30/2022 19  0 - 37 U/L Final   ALT 12/30/2022 18  0 - 35 U/L Final   Total Protein 12/30/2022 8.0  6.0 - 8.3 g/dL Final   Albumin 03/47/4259 4.0  3.5 - 5.2 g/dL Final   GFR 56/38/7564 92.44  >60.00 mL/min Final   Calculated using the CKD-EPI Creatinine Equation (2021)   Calcium 12/30/2022 10.0  8.4 - 10.5 mg/dL Final    RADIOGRAPHIC STUDIES: I have personally reviewed the radiological images as listed and agree with the findings in the report  No results found.  ASSESSMENT/PLAN 42 y.o. female with history of anemia treated with IV iron in the past due to intolerance of oral iron.    Anemia:  Secondary to iron deficiency due to blood loss from heavy menstrual bleeding and high demand from pregnancies.    Last IV iron infusion was in 2013 per patient.  Received PRBCs in 2017.   Currently not anemic and iron stores are normal.  Underwent hysterectomy in 2018 and as a result chronic blood loss has been abated.  No evidence of nutritional deficiency, hemoglobinopathy  or hemolysis currently.    Dysfunctional uterine bleeding:  Resolved with hysterectomy.  Will check PT/PTT/ von Willebrand panel to confirm that she does not have an underlying bleeding disorder     Cancer Staging  No matching staging information was found for the patient.   No problem-specific Assessment & Plan notes found for this encounter.   No orders of the defined types were placed in this encounter.   All questions were answered. The patient knows to call the clinic with any problems, questions or concerns.  This note was electronically signed.    Loni Muse, MD  03/03/2023 4:52 PM

## 2023-03-04 ENCOUNTER — Inpatient Hospital Stay: Payer: 59 | Attending: Oncology | Admitting: Oncology

## 2023-03-04 ENCOUNTER — Inpatient Hospital Stay: Payer: 59

## 2023-03-07 ENCOUNTER — Encounter: Payer: Self-pay | Admitting: Oncology

## 2023-03-07 ENCOUNTER — Other Ambulatory Visit (HOSPITAL_COMMUNITY): Payer: Self-pay

## 2023-03-29 DIAGNOSIS — R195 Other fecal abnormalities: Secondary | ICD-10-CM

## 2023-04-01 ENCOUNTER — Ambulatory Visit: Payer: 59 | Admitting: Nurse Practitioner

## 2023-05-29 DIAGNOSIS — S43432A Superior glenoid labrum lesion of left shoulder, initial encounter: Secondary | ICD-10-CM | POA: Diagnosis not present

## 2023-05-29 DIAGNOSIS — Z299 Encounter for prophylactic measures, unspecified: Secondary | ICD-10-CM | POA: Diagnosis not present

## 2023-05-29 DIAGNOSIS — G8929 Other chronic pain: Secondary | ICD-10-CM | POA: Diagnosis not present

## 2023-05-29 DIAGNOSIS — M25512 Pain in left shoulder: Secondary | ICD-10-CM | POA: Diagnosis not present

## 2023-05-29 DIAGNOSIS — M67912 Unspecified disorder of synovium and tendon, left shoulder: Secondary | ICD-10-CM | POA: Diagnosis not present

## 2023-06-02 ENCOUNTER — Encounter: Payer: Self-pay | Admitting: Family Medicine

## 2023-06-02 ENCOUNTER — Ambulatory Visit: Payer: 59 | Admitting: Family Medicine

## 2023-06-02 VITALS — BP 124/80 | HR 65 | Temp 97.8°F | Ht 63.0 in | Wt 145.0 lb

## 2023-06-02 DIAGNOSIS — M25512 Pain in left shoulder: Secondary | ICD-10-CM

## 2023-06-02 DIAGNOSIS — M25619 Stiffness of unspecified shoulder, not elsewhere classified: Secondary | ICD-10-CM | POA: Diagnosis not present

## 2023-06-02 DIAGNOSIS — G8929 Other chronic pain: Secondary | ICD-10-CM

## 2023-06-02 DIAGNOSIS — R29898 Other symptoms and signs involving the musculoskeletal system: Secondary | ICD-10-CM

## 2023-06-02 NOTE — Progress Notes (Signed)
Subjective:     Patient ID: Miranda Fowler, female    DOB: 1981/10/09, 42 y.o.   MRN: [id number]  Chief Complaint  Patient presents with   Shoulder Pain    Left shoulder/arm shooting pain since may (did xray in may and they said she needed MRI). Hard to raise arm    HPI  Discussed the use of AI scribe software for clinical note transcription with the patient, who gave verbal consent to proceed.  History of Present Illness         C/o left shoulder and left upper arm pain since May 2024. NKI.  She has taken a course of steroids. Did not do PT. She has not seen an orthopedist.  Recent visit to UC. Negative shoulder X ray.  Currently taking Tramadol, muscle relaxant.   Requests MRI of her shoulder.      Health Maintenance Due  Topic Date Due   DTaP/Tdap/Td (2 - Td or Tdap) 07/19/2021   INFLUENZA VACCINE  05/19/2023    Past Medical History:  Diagnosis Date   Anemia    Chronic problem for patient.  Followed by Heme, receives IV iron transfusions secondary to inability to tolerate PO supplements.     Arthritis    bilateral knees   Headache    Migraines   Hypothyroidism    post-radiation at age 36   Shortness of breath dyspnea    Vaginal high risk HPV DNA test positive 07/20/2021    Past Surgical History:  Procedure Laterality Date   BILATERAL SALPINGECTOMY Bilateral 2008   pt denies   BREAST BIOPSY Right 10/05/2022   MM RT BREAST BX W LOC DEV 1ST LESION IMAGE BX SPEC STEREO GUIDE 10/05/2022 [facility]   BREAST BIOPSY Left 10/05/2022   MM LT BREAST BX W LOC DEV 1ST LESION IMAGE BX SPEC STEREO GUIDE 10/05/2022 [facility]   CESAREAN SECTION     CYSTOSCOPY N/A 04/06/2016   Procedure: CYSTOSCOPY;  Surgeon: Ok Edwards, MD;  Location: [facility];  Service: Gynecology;  Laterality: N/A;   ENTEROSCOPY N/A 02/03/2023   Procedure: ENTEROSCOPY;  Surgeon: Napoleon Form, MD;  Location: [facility];  Service: Gastroenterology;  Laterality: N/A;   Push-enteroscopy   LAPAROSCOPIC ASSISTED VAGINAL HYSTERECTOMY N/A 04/06/2016   Procedure: LAPAROSCOPIC ASSISTED VAGINAL HYSTERECTOMY,  Bilateral Salpingectomy;  Surgeon: Ok Edwards, MD;  Location: [facility];  Service: Gynecology;  Laterality: N/A;   TUBAL LIGATION      Family History  Problem Relation Age of Onset   Anemia Mother    Lupus Mother    Sickle cell trait Mother    Hypertension Father    Sickle cell trait Sister    Breast cancer Paternal Aunt    Cancer Paternal Aunt        breast   Cancer Paternal Aunt        cervical   Colon cancer Paternal Uncle    Diabetes Maternal Grandmother    Diabetes Maternal Grandfather    Heart disease Paternal Grandmother     Social History   Socioeconomic History   Marital status: Married    Spouse name: Not on file   Number of children: Not on file   Years of education: Not on file   Highest education level: Not on file  Occupational History   Not on file  Tobacco Use   Smoking status: Never   Smokeless tobacco: Never  Vaping Use   Vaping status: Never Used  Substance and Sexual  Activity   Alcohol use: No    Alcohol/week: 0.0 standard drinks of alcohol    Comment: wine   Drug use: No   Sexual activity: Yes    Birth control/protection: Surgical    Comment: TUBAL-LIAGTION  Other Topics Concern   Not on file  Social History Narrative   Married, 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  10/2016   Social Determinants of Health   Financial Resource Strain: Not on file  Food Insecurity: Not on file  Transportation Needs: Not on file  Physical Activity: Not on file  Stress: Not on file  Social Connections: Not on file  Intimate Partner Violence: Not on file    Outpatient Medications Prior to Visit  Medication Sig Dispense Refill   albuterol (VENTOLIN HFA) 108 (90 Base) MCG/ACT inhaler Inhale 2 puffs into the lungs every 6 (six) hours as needed for wheezing or shortness of breath. 18 g 0   dicyclomine  (BENTYL) 10 MG capsule TAKE 1 CAPSULE (10 MG TOTAL) BY MOUTH 3 (THREE) TIMES DAILY AS NEEDED FOR SPASMS. 270 capsule 1   levocetirizine (XYZAL) 5 MG tablet Take 1 tablet (5 mg total) by mouth every evening. 30 tablet 2   methocarbamol (ROBAXIN) 750 MG tablet Take by mouth.     nabumetone (RELAFEN) 750 MG tablet Take by mouth.     pantoprazole (PROTONIX) 40 MG tablet Take 1 tablet (40 mg total) by mouth daily. 90 tablet 3   sucralfate (CARAFATE) 1 g tablet Take by mouth.     SYNTHROID 125 MCG tablet TAKE 1 TABLET BY MOUTH EVERY DAY BEFORE BREAKFAST 30 tablet 5   traMADol (ULTRAM) 50 MG tablet Take by mouth.     amphetamine-dextroamphetamine (ADDERALL) 5 MG tablet Take 1 tablet by mouth daily as needed for attention and concentration (Patient not taking: Reported on 02/07/2023) 30 tablet 0   linaclotide (LINZESS) 145 MCG CAPS capsule Take 1 capsule (145 mcg total) by mouth daily before breakfast. (Patient not taking: Reported on 02/07/2023) 30 capsule 2   No facility-administered medications prior to visit.    No Known Allergies  ROS     Objective:    Physical Exam Constitutional:      General: She is not in acute distress.    Appearance: She is not ill-appearing.  Eyes:     Extraocular Movements: Extraocular movements intact.     Conjunctiva/sclera: Conjunctivae normal.  Cardiovascular:     Rate and Rhythm: Normal rate.  Pulmonary:     Effort: Pulmonary effort is normal.  Musculoskeletal:     Left shoulder: Tenderness present. Decreased range of motion. Decreased strength. Normal pulse.     Left upper arm: Normal.     Cervical back: Normal range of motion and neck supple.  Skin:    General: Skin is warm and dry.  Neurological:     General: No focal deficit present.     Mental Status: She is alert and oriented to person, place, and time.  Psychiatric:        Mood and Affect: Mood normal.        Behavior: Behavior normal.        Thought Content: Thought content normal.       BP 124/80 (BP Location: Left Arm, Patient Position: Sitting, Cuff Size: Normal)   Pulse 65   Temp 97.8 F (36.6 C) (Temporal)   Ht 5\' 3"  (1.6 m)   Wt 145 lb (65.8 kg)   LMP 11/07/2015  SpO2 98%   BMI 25.69 kg/m  Wt Readings from Last 3 Encounters:  06/02/23 145 lb (65.8 kg)  02/07/23 147 lb (66.7 kg)  02/03/23 147 lb 14.9 oz (67.1 kg)       Assessment & Plan:   Problem List Items Addressed This Visit   None Visit Diagnoses     Chronic left shoulder pain    -  Primary   Relevant Medications   methocarbamol (ROBAXIN) 750 MG tablet   nabumetone (RELAFEN) 750 MG tablet   traMADol (ULTRAM) 50 MG tablet   Other Relevant Orders   MR SHOULDER LEFT WO CONTRAST   Limited range of motion (ROM) of shoulder       Relevant Orders   MR SHOULDER LEFT WO CONTRAST   Left arm weakness       Relevant Orders   MR SHOULDER LEFT WO CONTRAST      Reviewed notes and results from UC visit regarding same complaint.  Continue current medications.  MRI shoulder ordered.  Referral to orthopedist or sports medicine as needed. Follow up with PCP.   I am having [place] maintain her linaclotide, amphetamine-dextroamphetamine, pantoprazole, levocetirizine, albuterol, dicyclomine, Synthroid, sucralfate, methocarbamol, nabumetone, and traMADol.  No orders of the defined types were placed in this encounter.

## 2023-06-02 NOTE — Patient Instructions (Signed)
You should receive a call from [facility] to schedule a MRI of your left shoulder.

## 2023-06-21 ENCOUNTER — Inpatient Hospital Stay: Admission: RE | Admit: 2023-06-21 | Payer: 59 | Source: Ambulatory Visit

## 2023-06-21 ENCOUNTER — Telehealth: Payer: Self-pay | Admitting: Nurse Practitioner

## 2023-06-21 NOTE — Telephone Encounter (Signed)
She will need a PA done for the MRI, please let me know outcome so I can inform pt

## 2023-06-21 NOTE — Telephone Encounter (Signed)
Patient said that a pre authroization form needs to be sent for her MRI - they told patient they could probably get patient back in today.  Please call patient:  [phone]

## 2023-06-23 ENCOUNTER — Other Ambulatory Visit: Payer: Self-pay | Admitting: Family Medicine

## 2023-06-23 DIAGNOSIS — G8929 Other chronic pain: Secondary | ICD-10-CM

## 2023-06-23 DIAGNOSIS — M25619 Stiffness of unspecified shoulder, not elsewhere classified: Secondary | ICD-10-CM

## 2023-06-23 DIAGNOSIS — R29898 Other symptoms and signs involving the musculoskeletal system: Secondary | ICD-10-CM

## 2023-06-23 NOTE — Telephone Encounter (Signed)
Pt states you can do sports medicine referral for downstairs, would like for it to be urgent if possible as its still giving her bad pain

## 2023-06-23 NOTE — Telephone Encounter (Signed)
Patient returned Miranda Fowler's call and would like a call back at [phone].

## 2023-06-23 NOTE — Telephone Encounter (Signed)
LM for pt MRI was denied, per Vickie next step is to have her see an orthopedist or sports medicine provider. Advised pt to let us know if she would like to be referrred

## 2023-06-27 NOTE — Progress Notes (Unsigned)
    Miranda Fowler D.[facility] [address] [place] [address] Phone: [phone]   Assessment and Plan:     There are no diagnoses linked to this encounter.  ***   Pertinent previous records reviewed include ***   Follow Up: ***     Subjective:   I, Miranda Fowler, am serving as a Neurosurgeon for Doctor Richardean Sale   Chief Complaint: bilateral knee pain    HPI:    01/10/2023 Patient is a 42 year old female complaining of bilateral knee pain. Patient states that she has had knee pain for years, anterior knee pain , pain when standing it feels like her knee are going to give out ,left side more than the right, no MOI, no radiating pain, meloxicam doesn't help , naproxen doesn't help as well.    06/28/2023 Patient states    Relevant Historical Information: Hypothyroidism,  Additional pertinent review of systems negative.   Current Outpatient Medications:    albuterol (VENTOLIN HFA) 108 (90 Base) MCG/ACT inhaler, Inhale 2 puffs into the lungs every 6 (six) hours as needed for wheezing or shortness of breath., Disp: 18 g, Rfl: 0   amphetamine-dextroamphetamine (ADDERALL) 5 MG tablet, Take 1 tablet by mouth daily as needed for attention and concentration (Patient not taking: Reported on 02/07/2023), Disp: 30 tablet, Rfl: 0   dicyclomine (BENTYL) 10 MG capsule, TAKE 1 CAPSULE (10 MG TOTAL) BY MOUTH 3 (THREE) TIMES DAILY AS NEEDED FOR SPASMS., Disp: 270 capsule, Rfl: 1   levocetirizine (XYZAL) 5 MG tablet, Take 1 tablet (5 mg total) by mouth every evening., Disp: 30 tablet, Rfl: 2   linaclotide (LINZESS) 145 MCG CAPS capsule, Take 1 capsule (145 mcg total) by mouth daily before breakfast. (Patient not taking: Reported on 02/07/2023), Disp: 30 capsule, Rfl: 2   methocarbamol (ROBAXIN) 750 MG tablet, Take by mouth., Disp: , Rfl:    nabumetone (RELAFEN) 750 MG tablet, Take by mouth., Disp: , Rfl:    pantoprazole (PROTONIX) 40 MG tablet, Take 1  tablet (40 mg total) by mouth daily., Disp: 90 tablet, Rfl: 3   sucralfate (CARAFATE) 1 g tablet, Take by mouth., Disp: , Rfl:    SYNTHROID 125 MCG tablet, TAKE 1 TABLET BY MOUTH EVERY DAY BEFORE BREAKFAST, Disp: 30 tablet, Rfl: 5   Objective:     There were no vitals filed for this visit.    There is no height or weight on file to calculate BMI.    Physical Exam:    ***   Electronically signed by:  Miranda Fowler D.[facility] 4:37 PM 06/27/23

## 2023-06-28 ENCOUNTER — Ambulatory Visit (INDEPENDENT_AMBULATORY_CARE_PROVIDER_SITE_OTHER): Payer: 59

## 2023-06-28 ENCOUNTER — Ambulatory Visit: Payer: 59 | Admitting: Sports Medicine

## 2023-06-28 VITALS — BP 132/78 | Ht 63.0 in | Wt 144.0 lb

## 2023-06-28 DIAGNOSIS — M25512 Pain in left shoulder: Secondary | ICD-10-CM

## 2023-06-28 DIAGNOSIS — G8929 Other chronic pain: Secondary | ICD-10-CM | POA: Diagnosis not present

## 2023-06-28 NOTE — Patient Instructions (Signed)
Shoulder HEP 3-4 week follow up

## 2023-07-01 ENCOUNTER — Telehealth: Payer: Self-pay | Admitting: Sports Medicine

## 2023-07-01 ENCOUNTER — Other Ambulatory Visit: Payer: 59

## 2023-07-01 NOTE — Telephone Encounter (Signed)
Patient called stating that she has not had any relief from the injection in her shoulder. Please advise next steps.  Offered to make an appointment, she did not wish to do that due to the cost of her copay each time.

## 2023-07-04 ENCOUNTER — Ambulatory Visit: Payer: 59 | Admitting: Sports Medicine

## 2023-07-04 NOTE — Telephone Encounter (Signed)
Appointment scheduled.

## 2023-07-05 NOTE — Progress Notes (Unsigned)
    Aleen [facility]ells [facility] [address] [place] [address] Phone: [phone]   Assessment and Plan:     There are no diagnoses linked to this encounter.  ***   Pertinent previous records reviewed include ***   Follow Up: ***     Subjective:   I, Miranda Fowler, am serving as a Neurosurgeon for Doctor Richardean Sale   Chief Complaint: bilateral knee pain    HPI:    01/10/2023 Patient is a 42 year old female complaining of bilateral knee pain. Patient states that she has had knee pain for years, anterior knee pain , pain when standing it feels like her knee are going to give out ,left side more than the right, no MOI, no radiating pain, meloxicam doesn't help , naproxen doesn't help as well.    06/28/2023 Patient states left shoulder pain. Decreased ROM due to pain. Tylenol and ibu don't help. No MOI. Pain started maybe a year ago. May is when the pain really started. Shooting pain around the whole shoulder . No numbness or tingling.   07/06/2023 Patient states     Relevant Historical Information: Hypothyroidism,  Additional pertinent review of systems negative.   Current Outpatient Medications:    albuterol (VENTOLIN HFA) 108 (90 Base) MCG/ACT inhaler, Inhale 2 puffs into the lungs every 6 (six) hours as needed for wheezing or shortness of breath., Disp: 18 g, Rfl: 0   amphetamine-dextroamphetamine (ADDERALL) 5 MG tablet, Take 1 tablet by mouth daily as needed for attention and concentration, Disp: 30 tablet, Rfl: 0   dicyclomine (BENTYL) 10 MG capsule, TAKE 1 CAPSULE (10 MG TOTAL) BY MOUTH 3 (THREE) TIMES DAILY AS NEEDED FOR SPASMS., Disp: 270 capsule, Rfl: 1   levocetirizine (XYZAL) 5 MG tablet, Take 1 tablet (5 mg total) by mouth every evening., Disp: 30 tablet, Rfl: 2   linaclotide (LINZESS) 145 MCG CAPS capsule, Take 1 capsule (145 mcg total) by mouth daily before breakfast., Disp: 30 capsule, Rfl: 2   methocarbamol (ROBAXIN) 750  MG tablet, Take by mouth., Disp: , Rfl:    nabumetone (RELAFEN) 750 MG tablet, Take by mouth., Disp: , Rfl:    pantoprazole (PROTONIX) 40 MG tablet, Take 1 tablet (40 mg total) by mouth daily., Disp: 90 tablet, Rfl: 3   sucralfate (CARAFATE) 1 g tablet, Take by mouth., Disp: , Rfl:    SYNTHROID 125 MCG tablet, TAKE 1 TABLET BY MOUTH EVERY DAY BEFORE BREAKFAST, Disp: 30 tablet, Rfl: 5   Objective:     There were no vitals filed for this visit.    There is no height or weight on file to calculate BMI.    Physical Exam:    ***   Electronically signed by:  Aleen Sells [facility] 7:12 AM 07/05/23

## 2023-07-06 ENCOUNTER — Ambulatory Visit (INDEPENDENT_AMBULATORY_CARE_PROVIDER_SITE_OTHER): Payer: 59 | Admitting: Sports Medicine

## 2023-07-06 VITALS — HR 71 | Ht 63.0 in | Wt 144.0 lb

## 2023-07-06 DIAGNOSIS — M25512 Pain in left shoulder: Secondary | ICD-10-CM

## 2023-07-06 DIAGNOSIS — G8929 Other chronic pain: Secondary | ICD-10-CM

## 2023-07-06 MED ORDER — METHYLPREDNISOLONE 4 MG PO TBPK: ORAL_TABLET | ORAL | 0 refills | Status: DC

## 2023-07-06 NOTE — Patient Instructions (Signed)
Prednisone dos pak  Tylenol [phone] mg 2-3 times a day for pain relief  Left shoulder MRI Follow up 4 days after MRI to discuss results

## 2023-07-16 ENCOUNTER — Ambulatory Visit (INDEPENDENT_AMBULATORY_CARE_PROVIDER_SITE_OTHER): Payer: 59

## 2023-07-16 DIAGNOSIS — M25512 Pain in left shoulder: Secondary | ICD-10-CM

## 2023-07-16 DIAGNOSIS — G8929 Other chronic pain: Secondary | ICD-10-CM

## 2023-07-16 DIAGNOSIS — M19012 Primary osteoarthritis, left shoulder: Secondary | ICD-10-CM | POA: Diagnosis not present

## 2023-07-18 NOTE — Progress Notes (Deleted)
    Aleen [facility]ells [facility] [address] [place] [address] Phone: [phone]   Assessment and Plan:     There are no diagnoses linked to this encounter.  ***   Pertinent previous records reviewed include ***   Follow Up: ***     Subjective:   I, Lataria Courser, am serving as a Neurosurgeon for Doctor Richardean Sale   Chief Complaint: bilateral knee pain    HPI:    01/10/2023 Patient is a 42 year old female complaining of bilateral knee pain. Patient states that she has had knee pain for years, anterior knee pain , pain when standing it feels like her knee are going to give out ,left side more than the right, no MOI, no radiating pain, meloxicam doesn't help , naproxen doesn't help as well.    06/28/2023 Patient states left shoulder pain. Decreased ROM due to pain. Tylenol and ibu don't help. No MOI. Pain started maybe a year ago. May is when the pain really started. Shooting pain around the whole shoulder . No numbness or tingling.    07/06/2023 Patient states shoulder is the same , she isnt able to sleep through the night    07/19/2023 Patient states    Relevant Historical Information: Hypothyroidism,  Additional pertinent review of systems negative.   Current Outpatient Medications:    albuterol (VENTOLIN HFA) 108 (90 Base) MCG/ACT inhaler, Inhale 2 puffs into the lungs every 6 (six) hours as needed for wheezing or shortness of breath., Disp: 18 g, Rfl: 0   amphetamine-dextroamphetamine (ADDERALL) 5 MG tablet, Take 1 tablet by mouth daily as needed for attention and concentration, Disp: 30 tablet, Rfl: 0   dicyclomine (BENTYL) 10 MG capsule, TAKE 1 CAPSULE (10 MG TOTAL) BY MOUTH 3 (THREE) TIMES DAILY AS NEEDED FOR SPASMS., Disp: 270 capsule, Rfl: 1   levocetirizine (XYZAL) 5 MG tablet, Take 1 tablet (5 mg total) by mouth every evening., Disp: 30 tablet, Rfl: 2   linaclotide (LINZESS) 145 MCG CAPS capsule, Take 1 capsule (145 mcg  total) by mouth daily before breakfast., Disp: 30 capsule, Rfl: 2   methocarbamol (ROBAXIN) 750 MG tablet, Take by mouth., Disp: , Rfl:    methylPREDNISolone (MEDROL DOSEPAK) 4 MG TBPK tablet, Take 6 tablets on day 1.  Take 5 tablets on day 2.  Take 4 tablets on day 3.  Take 3 tablets on day 4.  Take 2 tablets on day 5.  Take 1 tablet on day 6., Disp: 21 tablet, Rfl: 0   nabumetone (RELAFEN) 750 MG tablet, Take by mouth., Disp: , Rfl:    pantoprazole (PROTONIX) 40 MG tablet, Take 1 tablet (40 mg total) by mouth daily., Disp: 90 tablet, Rfl: 3   sucralfate (CARAFATE) 1 g tablet, Take by mouth., Disp: , Rfl:    SYNTHROID 125 MCG tablet, TAKE 1 TABLET BY MOUTH EVERY DAY BEFORE BREAKFAST, Disp: 30 tablet, Rfl: 5   Objective:     There were no vitals filed for this visit.    There is no height or weight on file to calculate BMI.    Physical Exam:    ***   Electronically signed by:  Aleen Sells [facility] 7:45 AM 07/18/23

## 2023-07-19 ENCOUNTER — Ambulatory Visit: Payer: 59 | Admitting: Sports Medicine

## 2023-07-19 ENCOUNTER — Telehealth: Payer: Self-pay | Admitting: Sports Medicine

## 2023-07-19 NOTE — Telephone Encounter (Signed)
Pt given MRI results. She completed steroids and is still having trouble with pain, mostly at night. States tylenol does not help.

## 2023-07-20 NOTE — Telephone Encounter (Signed)
Spoke with pt, she is at work and will callback to reschedule. Pt informed that she missed 10/1 recheck.

## 2023-08-07 ENCOUNTER — Encounter (HOSPITAL_BASED_OUTPATIENT_CLINIC_OR_DEPARTMENT_OTHER): Payer: Self-pay | Admitting: Emergency Medicine

## 2023-08-07 ENCOUNTER — Emergency Department (HOSPITAL_BASED_OUTPATIENT_CLINIC_OR_DEPARTMENT_OTHER)
Admission: EM | Admit: 2023-08-07 | Discharge: 2023-08-07 | Disposition: A | Discharge status: Home / Self Care | Payer: 59 | Attending: Emergency Medicine | Admitting: Emergency Medicine

## 2023-08-07 DIAGNOSIS — Z79890 Hormone replacement therapy (postmenopausal): Secondary | ICD-10-CM | POA: Insufficient documentation

## 2023-08-07 DIAGNOSIS — G43909 Migraine, unspecified, not intractable, without status migrainosus: Secondary | ICD-10-CM | POA: Insufficient documentation

## 2023-08-07 DIAGNOSIS — R519 Headache, unspecified: Secondary | ICD-10-CM | POA: Diagnosis not present

## 2023-08-07 DIAGNOSIS — E039 Hypothyroidism, unspecified: Secondary | ICD-10-CM | POA: Diagnosis not present

## 2023-08-07 MED ORDER — SODIUM CHLORIDE 0.9 % IV BOLUS
500.0000 mL | Freq: Once | INTRAVENOUS | Status: AC
  Administered 2023-08-07: 500 mL via INTRAVENOUS

## 2023-08-07 MED ORDER — KETOROLAC TROMETHAMINE 15 MG/ML IJ SOLN
15.0000 mg | Freq: Once | INTRAMUSCULAR | Status: AC
  Administered 2023-08-07: 15 mg via INTRAVENOUS
  Filled 2023-08-07: qty 1

## 2023-08-07 MED ORDER — ONDANSETRON HCL 4 MG/2ML IJ SOLN
4.0000 mg | Freq: Once | INTRAMUSCULAR | Status: AC
  Administered 2023-08-07: 4 mg via INTRAVENOUS
  Filled 2023-08-07: qty 2

## 2023-08-07 MED ORDER — PROCHLORPERAZINE MALEATE 10 MG PO TABS
10.0000 mg | ORAL_TABLET | Freq: Once | ORAL | Status: AC
  Administered 2023-08-07: 10 mg via ORAL
  Filled 2023-08-07: qty 1

## 2023-08-07 MED ORDER — ONDANSETRON 4 MG PO TBDP
4.0000 mg | ORAL_TABLET | Freq: Three times a day (TID) | ORAL | 0 refills | Status: DC | PRN
Start: 2023-08-07 — End: 2023-10-20

## 2023-08-07 MED ORDER — DIPHENHYDRAMINE HCL 50 MG/ML IJ SOLN
12.5000 mg | Freq: Once | INTRAMUSCULAR | Status: AC
  Administered 2023-08-07: 12.5 mg via INTRAVENOUS
  Filled 2023-08-07: qty 1

## 2023-08-07 NOTE — Discharge Instructions (Signed)
Your exam today was reassuring.  After medications he had significant improvement in your headache.  Continue taking your over-the-counter medications for the headache.  Follow-up with your primary care provider.  For any concerning symptoms return to the emergency room.

## 2023-08-07 NOTE — ED Triage Notes (Addendum)
Frontal headache since yesterday with nausea. Took tylenol yesterday. No medication today.

## 2023-08-07 NOTE — ED Provider Notes (Cosign Needed Addendum)
[facility] AT [facility] Provider Note   CSN: [id number] Arrival date & time: 08/07/23  0754     History  Chief Complaint  Patient presents with   Headache    Miranda Fowler is a 42 y.o. female.  42 year old female presents today for concern of headache which has been going on for about 1 week.  Intermittent in nature.  Associated with nausea and light sensitivity.  Does have history of migraine headaches.  But she states this has not flared up in quite some time.  No visual change, weakness or paresthesias.  She has tried over-the-counter medications without some relief however she states over the past day she has not had significant relief and her headache worsened yesterday.  She denies any respiratory symptoms, or abdominal pain.  The history is provided by the patient. No language interpreter was used.       Home Medications Prior to Admission medications   Medication Sig Start Date End Date Taking? Authorizing Provider  albuterol (VENTOLIN HFA) 108 (90 Base) MCG/ACT inhaler Inhale 2 puffs into the lungs every 6 (six) hours as needed for wheezing or shortness of breath. 02/07/23   Gwenlyn Fudge, FNP  amphetamine-dextroamphetamine (ADDERALL) 5 MG tablet Take 1 tablet by mouth daily as needed for attention and concentration 12/30/22   [name], NP  dicyclomine (BENTYL) 10 MG capsule TAKE 1 CAPSULE (10 MG TOTAL) BY MOUTH 3 (THREE) TIMES DAILY AS NEEDED FOR SPASMS. 02/25/23   Napoleon Form, MD  levocetirizine (XYZAL) 5 MG tablet Take 1 tablet (5 mg total) by mouth every evening. 02/07/23   Gwenlyn Fudge, FNP  linaclotide [facility]) 145 MCG CAPS capsule Take 1 capsule (145 mcg total) by mouth daily before breakfast. 09/28/22   Unk Lightning, PA  methocarbamol (ROBAXIN) 750 MG tablet Take by mouth. 05/29/23   [provider]  methylPREDNISolone (MEDROL DOSEPAK) 4 MG TBPK tablet Take 6 tablets on day 1.  Take 5 tablets on  day 2.  Take 4 tablets on day 3.  Take 3 tablets on day 4.  Take 2 tablets on day 5.  Take 1 tablet on day 6. 07/06/23   Richardean Sale, DO  nabumetone (RELAFEN) 750 MG tablet Take by mouth. 05/29/23   [provider]  pantoprazole (PROTONIX) 40 MG tablet Take 1 tablet (40 mg total) by mouth daily. 02/03/23 02/03/24  Napoleon Form, MD  sucralfate (CARAFATE) 1 g tablet Take by mouth. 05/29/23   [provider]  SYNTHROID 125 MCG tablet TAKE 1 TABLET BY MOUTH EVERY DAY BEFORE BREAKFAST 03/01/23   [name], NP      Allergies    Patient has no known allergies.    Review of Systems   Review of Systems  Constitutional:  Negative for chills and fever.  Eyes:  Positive for photophobia. Negative for visual disturbance.  Respiratory:  Negative for shortness of breath.   Gastrointestinal:  Negative for abdominal pain.  Neurological:  Positive for headaches. Negative for weakness.  All other systems reviewed and are negative.   Physical Exam Updated Vital Signs BP 120/85 (BP Location: Right Arm)   Pulse 62   Temp 97.9 F (36.6 C) (Oral)   Resp 16   Ht 5\' 3"  (1.6 m)   Wt 67.6 kg   LMP 11/07/2015   SpO2 100%   BMI 26.39 kg/m  Physical Exam Vitals and nursing note reviewed.  Constitutional:      General:  She is not in acute distress.    Appearance: Normal appearance. She is not ill-appearing.  HENT:     Head: Normocephalic and atraumatic.     Nose: Nose normal.  Eyes:     Conjunctiva/sclera: Conjunctivae normal.  Cardiovascular:     Rate and Rhythm: Normal rate and regular rhythm.  Pulmonary:     Effort: Pulmonary effort is normal. No respiratory distress.  Abdominal:     General: There is no distension.     Palpations: Abdomen is soft.     Tenderness: There is no abdominal tenderness.  Musculoskeletal:        General: No deformity.  Skin:    Findings: No rash.  Neurological:     General: No focal deficit present.     Mental Status: She is alert and  oriented to person, place, and time. Mental status is at baseline.     Cranial Nerves: No cranial nerve deficit.     Motor: No weakness.     Comments: Negative meningeal signs.     ED Results / Procedures / Treatments   Labs (all labs ordered are listed, but only abnormal results are displayed) Labs Reviewed - No data to display  EKG None  Radiology No results found.  Procedures Procedures    Medications Ordered in ED Medications  ketorolac (TORADOL) 15 MG/ML injection 15 mg (has no administration in time range)  prochlorperazine (COMPAZINE) tablet 10 mg (has no administration in time range)  diphenhydrAMINE (BENADRYL) injection 12.5 mg (has no administration in time range)  sodium chloride 0.9 % bolus 500 mL (has no administration in time range)  ondansetron (ZOFRAN) injection 4 mg (has no administration in time range)    ED Course/ Medical Decision Making/ A&P                                 Medical Decision Making Risk Prescription drug management.   Medical Decision Making / ED Course   This patient presents to the ED for concern of headache, this involves an extensive number of treatment options, and is a complaint that carries with it a high risk of complications and morbidity.  The differential diagnosis includes migraine headache, cluster headache, sinus headache, pressure headache, intracranial bleed  MDM: 42 year old female presents today for concern of headache.  Ongoing for about a week.  Intermittent in nature.  Associated with photosensitivity and nausea.  No vomiting.  No head injury.  Neurologically intact.  Migraine cocktail given.  Significant improvement of headache.  She is appropriate for discharge with follow-up with her PCP.  Patient voices understanding and is in agreement with plan.   Lab Tests: -I ordered, reviewed, and interpreted labs.   The pertinent results include:   Labs Reviewed - No data to display    EKG  EKG  Interpretation Date/Time:    Ventricular Rate:    PR Interval:    QRS Duration:    QT Interval:    QTC Calculation:   R Axis:      Text Interpretation:          Medicines ordered and prescription drug management: Meds ordered this encounter  Medications   ketorolac (TORADOL) 15 MG/ML injection 15 mg   prochlorperazine (COMPAZINE) tablet 10 mg   diphenhydrAMINE (BENADRYL) injection 12.5 mg   sodium chloride 0.9 % bolus 500 mL   ondansetron (ZOFRAN) injection 4 mg    -I have reviewed the  patients home medicines and have made adjustments as needed   Reevaluation: After the interventions noted above, I reevaluated the patient and found that they have :improved  Co morbidities that complicate the patient evaluation  Past Medical History:  Diagnosis Date   Anemia    Chronic problem for patient.  Followed by Heme, receives IV iron transfusions secondary to inability to tolerate PO supplements.     Arthritis    bilateral knees   Headache    Migraines   Hypothyroidism    post-radiation at age 53   Shortness of breath dyspnea    Vaginal high risk HPV DNA test positive 07/20/2021      Dispostion: Patient discharged in stable condition.  Return precaution discussed.  Additional workup with imaging considered however significant improvement of headache after migraine cocktail.  Reports history of migraines as well.  Return precautions discussed.  Zofran ODT sent to pharmacy at patient's request.   Final Clinical Impression(s) / ED Diagnoses Final diagnoses:  Migraine without status migrainosus, not intractable, unspecified migraine type    Rx / DC Orders ED Discharge Orders     None         Marita Kansas, PA-C 08/07/23 1046    [address] PA-C 08/07/23 1058    Alvira Monday, MD 08/08/23 2243

## 2023-08-09 ENCOUNTER — Ambulatory Visit: Payer: 59 | Admitting: Internal Medicine

## 2023-08-10 ENCOUNTER — Encounter: Payer: Self-pay | Admitting: Nurse Practitioner

## 2023-08-10 ENCOUNTER — Other Ambulatory Visit: Payer: Self-pay

## 2023-08-10 ENCOUNTER — Other Ambulatory Visit (INDEPENDENT_AMBULATORY_CARE_PROVIDER_SITE_OTHER): Payer: 59

## 2023-08-10 DIAGNOSIS — E039 Hypothyroidism, unspecified: Secondary | ICD-10-CM

## 2023-08-10 DIAGNOSIS — Z131 Encounter for screening for diabetes mellitus: Secondary | ICD-10-CM

## 2023-08-10 DIAGNOSIS — E785 Hyperlipidemia, unspecified: Secondary | ICD-10-CM

## 2023-08-10 LAB — COMPREHENSIVE METABOLIC PANEL
ALT: 14 U/L (ref 0–35)
AST: 19 U/L (ref 0–37)
Albumin: 4.1 g/dL (ref 3.5–5.2)
Alkaline Phosphatase: 69 U/L (ref 39–117)
BUN: 12 mg/dL (ref 6–23)
CO2: 30 meq/L (ref 19–32)
Calcium: 10.4 mg/dL (ref 8.4–10.5)
Chloride: 103 meq/L (ref 96–112)
Creatinine, Ser: 0.79 mg/dL (ref 0.40–1.20)
GFR: 92.04 mL/min (ref >60.00)
Glucose, Bld: 91 mg/dL (ref 70–99)
Potassium: 4.3 meq/L (ref 3.5–5.1)
Sodium: 138 meq/L (ref 135–145)
Total Bilirubin: 0.6 mg/dL (ref 0.2–1.2)
Total Protein: 7.9 g/dL (ref 6.0–8.3)

## 2023-08-10 LAB — LIPID PANEL
Cholesterol: 190 mg/dL (ref 0–200)
HDL: 83.9 mg/dL (ref >39.00)
LDL Cholesterol: 99 mg/dL (ref 0–99)
NonHDL: 105.66
Total CHOL/HDL Ratio: 2
Triglycerides: 34 mg/dL (ref 0.0–149.0)
VLDL: 6.8 mg/dL (ref 0.0–40.0)

## 2023-08-10 LAB — CBC WITH DIFFERENTIAL/PLATELET
Basophils Absolute: 0 10*3/uL (ref 0.0–0.1)
Basophils Relative: 0.9 % (ref 0.0–3.0)
Eosinophils Absolute: 0 10*3/uL (ref 0.0–0.7)
Eosinophils Relative: 0.1 % (ref 0.0–5.0)
HCT: 38.1 % (ref 36.0–46.0)
Hemoglobin: 12.5 g/dL (ref 12.0–15.0)
Lymphocytes Relative: 37.1 % (ref 12.0–46.0)
Lymphs Abs: 1.5 10*3/uL (ref 0.7–4.0)
MCHC: 32.8 g/dL (ref 30.0–36.0)
MCV: 100.8 fL — ABNORMAL HIGH (ref 78.0–100.0)
Monocytes Absolute: 0.6 10*3/uL (ref 0.1–1.0)
Monocytes Relative: 14.1 % — ABNORMAL HIGH (ref 3.0–12.0)
Neutro Abs: 1.9 10*3/uL (ref 1.4–7.7)
Neutrophils Relative %: 47.8 % (ref 43.0–77.0)
Platelets: 297 10*3/uL (ref 150.0–400.0)
RBC: 3.78 Mil/uL — ABNORMAL LOW (ref 3.87–5.11)
RDW: 11.9 % (ref 11.5–15.5)
WBC: 4 10*3/uL (ref 4.0–10.5)

## 2023-08-10 LAB — TSH: TSH: 1.08 u[IU]/mL (ref 0.35–5.50)

## 2023-08-10 LAB — HEMOGLOBIN A1C: Hgb A1c MFr Bld: 5.2 % (ref 4.6–6.5)

## 2023-08-12 ENCOUNTER — Ambulatory Visit: Payer: 59 | Admitting: Family Medicine

## 2023-08-12 ENCOUNTER — Ambulatory Visit: Payer: 59 | Admitting: Nurse Practitioner

## 2023-08-12 VITALS — BP 130/84 | HR 66 | Temp 98.2°F | Ht 63.0 in | Wt 151.4 lb

## 2023-08-12 DIAGNOSIS — R42 Dizziness and giddiness: Secondary | ICD-10-CM | POA: Diagnosis not present

## 2023-08-12 DIAGNOSIS — G43919 Migraine, unspecified, intractable, without status migrainosus: Secondary | ICD-10-CM | POA: Diagnosis not present

## 2023-08-12 DIAGNOSIS — K5900 Constipation, unspecified: Secondary | ICD-10-CM | POA: Diagnosis not present

## 2023-08-12 MED ORDER — RIZATRIPTAN BENZOATE 5 MG PO TABS: 5.0000 mg | ORAL_TABLET | ORAL | 0 refills | Status: DC | PRN

## 2023-08-12 MED ORDER — MECLIZINE HCL 25 MG PO TABS: 25.0000 mg | ORAL_TABLET | Freq: Three times a day (TID) | ORAL | 1 refills | Status: AC | PRN

## 2023-08-12 NOTE — Assessment & Plan Note (Addendum)
Etiology unclear but I think this may either be migraine versus an atypical BPPV.  Cardiovascular and neurologic exam mainly intact other than having to stop EOMs due to this exacerbating her dizziness. Referral to neurology offered, but patient declined.  In addition to Maxalt for abortive therapy for headache will also prescribe meclizine that she can take as needed for dizziness and continue to take Zofran as needed for nausea.

## 2023-08-12 NOTE — Assessment & Plan Note (Signed)
In case constipation is exacerbating her symptoms I will recommend she take daily MiraLAX until she feels that she is no longer constipated.  If symptoms are not improved by early to mid week next week she should return to the office for abdominal x-ray.

## 2023-08-12 NOTE — Assessment & Plan Note (Signed)
Overall etiology unclear. Because she is having headache and she has had migraine in the past and currently is experiencing photophobia and nausea with headache we will prescribe Maxalt that she can take as abortive therapy as needed.

## 2023-08-12 NOTE — Patient Instructions (Signed)
Take miralax every day for a week or so until you feel like you are no longer constipated.

## 2023-08-12 NOTE — Progress Notes (Signed)
Established Patient Office Visit  Estimated Creatinine Clearance: 85.2 mL/min (by C-G formula based on SCr of 0.79 mg/dL).   Subjective   Patient ID: Miranda Fowler, female    DOB: 1981/10/08  Age: 42 y.o. MRN: [id number]  Chief Complaint  Patient presents with   Nausea   Dizziness    Patient arrives today for the above.  She was seen in the ER about 5 days ago for similar symptoms.  At that time they treated her for migraine and symptoms initially improved.  However symptoms are now returned.  She describes a feeling of motion sickness and dizziness especially with movements.  Does not specifically feeling like the room is spinning around her but she does have this dizziness.  Does not feel near syncopal.  No change in vision other than seeing some floaters at times.  She does have a history of migraine, does not currently have any abortive or preventative therapy available to her right now.  She has been taking ondansetron as needed to help manage the nausea with minimal symptom relief.  Labs done about 2 days ago identified stable kidney function and electrolytes, stable blood counts, normal TSH, normal A1c.  She is status post hysterectomy.  She also mentions she will sometimes experience nausea when she is constipated.  Reports she is not sure when her last bowel movement was but thinks it may have been over a week ago.    ROS: see HPI    Objective:     BP 130/84   Pulse 66   Temp 98.2 F (36.8 C) (Temporal)   Ht 5\' 3"  (1.6 m)   Wt 151 lb 6 oz (68.7 kg)   LMP 11/07/2015   SpO2 98%   BMI 26.81 kg/m  BP Readings from Last 3 Encounters:  08/12/23 130/84  08/07/23 120/85  06/28/23 132/78   Wt Readings from Last 3 Encounters:  08/12/23 151 lb 6 oz (68.7 kg)  08/07/23 149 lb (67.6 kg)  07/06/23 144 lb (65.3 kg)      Physical Exam Vitals reviewed.  Constitutional:      General: She is not in acute distress.    Appearance: Normal appearance.  HENT:     Head:  Normocephalic and atraumatic.  Eyes:     Comments: Unable to complete EOMs as following my finger with eyes was exacerbating her headache/dizziness  Neck:     Vascular: No carotid bruit.  Cardiovascular:     Rate and Rhythm: Normal rate and regular rhythm.     Pulses: Normal pulses.     Heart sounds: Normal heart sounds.  Pulmonary:     Effort: Pulmonary effort is normal.     Breath sounds: Normal breath sounds.  Skin:    General: Skin is warm and dry.  Neurological:     General: No focal deficit present.     Mental Status: She is alert and oriented to person, place, and time.     Cranial Nerves: Cranial nerves 2-12 are intact.     Motor: Motor function is intact.     Gait: Gait is intact.  Psychiatric:        Mood and Affect: Mood normal.        Behavior: Behavior normal.        Judgment: Judgment normal.      No results found for any visits on 08/12/23.    The 10-year ASCVD risk score (Arnett DK, et al., 2019) is: 0.3%    Assessment &  Plan:   Problem List Items Addressed This Visit       Cardiovascular and Mediastinum   Intractable migraine without status migrainosus    Overall etiology unclear. Because she is having headache and she has had migraine in the past and currently is experiencing photophobia and nausea with headache we will prescribe Maxalt that she can take as abortive therapy as needed.      Relevant Medications   rizatriptan (MAXALT) 5 MG tablet     Other   Dizziness - Primary    Etiology unclear but I think this may either be migraine versus an atypical BPPV.  Cardiovascular and neurologic exam mainly intact other than having to stop EOMs due to this exacerbating her dizziness. Referral to neurology offered, but patient declined.  In addition to Maxalt for abortive therapy for headache will also prescribe meclizine that she can take as needed for dizziness and continue to take Zofran as needed for nausea.      Relevant Medications   meclizine  (ANTIVERT) 25 MG tablet   Constipation    In case constipation is exacerbating her symptoms I will recommend she take daily MiraLAX until she feels that she is no longer constipated.  If symptoms are not improved by early to mid week next week she should return to the office for abdominal x-ray.      Relevant Orders   DG Abd 2 Views    Patient to follow-up in around 4 to 6 weeks for close monitoring.  If symptoms do not improve would recommend either referral to neurology and/or imaging of the brain.  Patient was encouraged to call office if symptoms worsen or if she feels she needs to be evaluated in person before follow-up.  She reports her understanding.  Return in about 1 month (around 09/12/2023) for F/U with Rusty Villella.    [name], NP

## 2023-09-08 ENCOUNTER — Other Ambulatory Visit: Payer: Self-pay | Admitting: Nurse Practitioner

## 2023-09-08 DIAGNOSIS — G43919 Migraine, unspecified, intractable, without status migrainosus: Secondary | ICD-10-CM

## 2023-10-06 ENCOUNTER — Ambulatory Visit: Payer: 59 | Admitting: Nurse Practitioner

## 2023-10-20 ENCOUNTER — Encounter: Payer: Self-pay | Admitting: Oncology

## 2023-10-20 ENCOUNTER — Ambulatory Visit
Admission: EM | Admit: 2023-10-20 | Discharge: 2023-10-20 | Disposition: A | Discharge status: Home / Self Care | Payer: Managed Care, Other (non HMO) | Attending: Physician Assistant | Admitting: Physician Assistant

## 2023-10-20 ENCOUNTER — Other Ambulatory Visit: Payer: Self-pay

## 2023-10-20 ENCOUNTER — Encounter: Payer: Self-pay | Admitting: *Deleted

## 2023-10-20 DIAGNOSIS — B349 Viral infection, unspecified: Secondary | ICD-10-CM

## 2023-10-20 MED ORDER — ONDANSETRON 4 MG PO TBDP: 4.0000 mg | ORAL_TABLET | Freq: Three times a day (TID) | ORAL | 0 refills | Status: AC | PRN

## 2023-10-20 MED ORDER — FLUCONAZOLE 150 MG PO TABS: 150.0000 mg | ORAL_TABLET | Freq: Every day | ORAL | 0 refills | Status: DC

## 2023-10-20 NOTE — ED Triage Notes (Signed)
 SHOB worse yesterday. SHOB is new for PT.

## 2023-10-20 NOTE — Discharge Instructions (Addendum)
 Finish tamiflu

## 2023-10-24 NOTE — ED Provider Notes (Signed)
 Miranda Fowler    CSN: [id number] Arrival date & time: 10/20/23  0931      History   Chief Complaint Chief Complaint  Patient presents with   Shortness of Breath    HPI Miranda Fowler is a 43 y.o. female.   Patient complains of feeling short of breath.  Patient denies any fever or chills.  Denies any exposure to flu or COVID.   Shortness of Breath   Past Medical History:  Diagnosis Date   Anemia    Chronic problem for patient.  Followed by Heme, receives IV iron transfusions secondary to inability to tolerate PO supplements.     Arthritis    bilateral knees   Headache    Migraines   Hypothyroidism    post-radiation at age 65   Shortness of breath dyspnea    Vaginal high risk HPV DNA test positive 07/20/2021    Patient Active Problem List   Diagnosis Date Noted   Dizziness 08/12/2023   Constipation 08/12/2023   Intractable migraine without status migrainosus 08/12/2023   Heme positive stool 03/29/2023   Encounter for routine adult physical exam with abnormal findings 12/30/2022   Hyperlipidemia 12/30/2022   Diabetes mellitus screening 12/30/2022   Acute sinusitis 12/30/2022   Impacted cerumen of right ear 12/30/2022   H/O dysfunctional uterine bleeding 08/31/2022   Medication intolerance 08/31/2022   Mild traumatic brain injury (HCC) 08/24/2022   Nocturnal leg cramps 08/05/2022   Attention deficit hyperactivity disorder (ADHD) 08/05/2022   Leukopenia 04/02/2022   History of ADHD 02/12/2022   Unintentional weight loss 02/12/2022   Vaginal high risk HPV DNA test positive 07/20/2021   Abnormal echocardiogram 08/30/2018   Anxiety 03/22/2018   Attention and concentration deficit 01/05/2018   Forgetfulness 01/05/2018   Attention deficit disorder (ADD) without hyperactivity 01/05/2018   Daytime somnolence 07/08/2016   Snoring 07/08/2016   Chronic fatigue 07/08/2016   Knee pain, bilateral 07/08/2016   Chronic pain of both knees 07/08/2016    Anemia 11/07/2015   Hypothyroidism 04/06/2010   Iron deficiency anemia 04/06/2010    Past Surgical History:  Procedure Laterality Date   BILATERAL SALPINGECTOMY Bilateral 2008   pt denies   BREAST BIOPSY Right 10/05/2022   MM RT BREAST BX W LOC DEV 1ST LESION IMAGE BX SPEC STEREO GUIDE 10/05/2022 [facility]   BREAST BIOPSY Left 10/05/2022   MM LT BREAST BX W LOC DEV 1ST LESION IMAGE BX SPEC STEREO GUIDE 10/05/2022 [facility]   CESAREAN SECTION     CYSTOSCOPY N/A 04/06/2016   Procedure: CYSTOSCOPY;  Surgeon: Curlee VEAR Guan, MD;  Location: [facility];  Service: Gynecology;  Laterality: N/A;   ENTEROSCOPY N/A 02/03/2023   Procedure: ENTEROSCOPY;  Surgeon: Shila Gustav GAILS, MD;  Location: [facility];  Service: Gastroenterology;  Laterality: N/A;  Push-enteroscopy   LAPAROSCOPIC ASSISTED VAGINAL HYSTERECTOMY N/A 04/06/2016   Procedure: LAPAROSCOPIC ASSISTED VAGINAL HYSTERECTOMY,  Bilateral Salpingectomy;  Surgeon: Curlee VEAR Guan, MD;  Location: [facility];  Service: Gynecology;  Laterality: N/A;   TUBAL LIGATION      OB History     Gravida  4   Para  2   Term  0   Preterm  2   AB  0   Living  4      SAB  0   IAB  0   Ectopic  0   Multiple  0   Live Births  Home Medications    Prior to Admission medications   Medication Sig Start Date End Date Taking? Authorizing Provider  fluconazole  (DIFLUCAN ) 150 MG tablet Take 1 tablet (150 mg total) by mouth daily. 10/20/23  Yes Payton Moder K, PA-C  ondansetron  (ZOFRAN -ODT) 4 MG disintegrating tablet Take 1 tablet (4 mg total) by mouth every 8 (eight) hours as needed for nausea or vomiting. 10/20/23  Yes Flint Sonny POUR, PA-C  SYNTHROID  125 MCG tablet TAKE 1 TABLET BY MOUTH EVERY DAY BEFORE BREAKFAST 03/01/23  Yes Gray, Sarah E, NP  levocetirizine (XYZAL ) 5 MG tablet Take 1 tablet (5 mg total) by mouth every evening. 02/07/23   Merlynn Niki FALCON, FNP  linaclotide  (LINZESS ) 145 MCG CAPS capsule Take 1  capsule (145 mcg total) by mouth daily before breakfast. 09/28/22   Beather Delon Gibson, PA  meclizine  (ANTIVERT ) 25 MG tablet Take 1 tablet (25 mg total) by mouth 3 (three) times daily as needed for dizziness. 08/12/23   Elnor Lauraine BRAVO, NP  methylPREDNISolone  (MEDROL  DOSEPAK) 4 MG TBPK tablet Take 6 tablets on day 1.  Take 5 tablets on day 2.  Take 4 tablets on day 3.  Take 3 tablets on day 4.  Take 2 tablets on day 5.  Take 1 tablet on day 6. 07/06/23   Leonce Katz, DO    Family History Family History  Problem Relation Age of Onset   Anemia Mother    Lupus Mother    Sickle cell trait Mother    Hypertension Father    Sickle cell trait Sister    Breast cancer Paternal Aunt    Cancer Paternal Aunt        breast   Cancer Paternal Aunt        cervical   Colon cancer Paternal Uncle    Diabetes Maternal Grandmother    Diabetes Maternal Grandfather    Heart disease Paternal Grandmother     Social History Social History   Tobacco Use   Smoking status: Never   Smokeless tobacco: Never  Vaping Use   Vaping status: Never Used  Substance Use Topics   Alcohol use: No    Alcohol/week: 0.0 standard drinks of alcohol    Comment: wine   Drug use: No     Allergies   Patient has no known allergies.   Review of Systems Review of Systems  Respiratory:  Positive for shortness of breath.   All other systems reviewed and are negative.    Physical Exam Triage Vital Signs ED Triage Vitals  Encounter Vitals Group     BP 10/20/23 1017 122/77     Systolic BP Percentile --      Diastolic BP Percentile --      Pulse Rate 10/20/23 1017 67     Resp 10/20/23 1017 20     Temp 10/20/23 1017 98.5 F (36.9 C)     Temp src --      SpO2 10/20/23 1017 96 %     Weight --      Height --      Head Circumference --      Peak Flow --      Pain Score 10/20/23 1013 0     Pain Loc --      Pain Education --      Exclude from Growth Chart --    No data found.  Updated Vital Signs BP  122/77   Pulse 67   Temp 98.5 F (36.9 C)  Resp 20   LMP 11/07/2015   SpO2 96%   Visual Acuity Right Eye Distance:   Left Eye Distance:   Bilateral Distance:    Right Eye Near:   Left Eye Near:    Bilateral Near:     Physical Exam Vitals and nursing note reviewed.  Constitutional:      Appearance: She is well-developed.  HENT:     Head: Normocephalic.  Cardiovascular:     Rate and Rhythm: Normal rate.  Pulmonary:     Effort: Pulmonary effort is normal.  Abdominal:     General: There is no distension.  Musculoskeletal:        General: Normal range of motion.     Cervical back: Normal range of motion.  Skin:    General: Skin is warm.  Neurological:     General: No focal deficit present.     Mental Status: She is alert and oriented to person, place, and time.      UC Treatments / Results  Labs (all labs ordered are listed, but only abnormal results are displayed) Labs Reviewed - No data to display  EKG   Radiology No results found.  Procedures Procedures (including critical Fowler time)  Medications Ordered in UC Medications - No data to display  Initial Impression / Assessment and Plan / UC Course  I have reviewed the triage vital signs and the nursing notes.  Pertinent labs & imaging results that were available during my Fowler of the patient were reviewed by me and considered in my medical decision making (see chart for details).      Final Clinical Impressions(s) / UC Diagnoses   Final diagnoses:  Viral illness     Discharge Instructions      Finish tamiflu    ED Prescriptions     Medication Sig Dispense Auth. Provider   fluconazole  (DIFLUCAN ) 150 MG tablet Take 1 tablet (150 mg total) by mouth daily. 1 tablet Lisanne Ponce K, PA-C   ondansetron  (ZOFRAN -ODT) 4 MG disintegrating tablet Take 1 tablet (4 mg total) by mouth every 8 (eight) hours as needed for nausea or vomiting. 20 tablet Kailynne Ferrington K, PA-C      PDMP not reviewed  this encounter. An After Visit Summary was printed and given to the patient.       Flint Sonny POUR, PA-C 10/24/23 2053

## 2023-10-31 ENCOUNTER — Encounter: Payer: Self-pay | Admitting: Oncology

## 2023-11-07 ENCOUNTER — Ambulatory Visit: Payer: Managed Care, Other (non HMO) | Admitting: Nurse Practitioner

## 2023-11-07 ENCOUNTER — Encounter: Payer: Self-pay | Admitting: Nurse Practitioner

## 2023-11-07 VITALS — BP 126/76 | HR 75 | Temp 98.3°F | Ht 63.0 in | Wt 145.5 lb

## 2023-11-07 DIAGNOSIS — G43919 Migraine, unspecified, intractable, without status migrainosus: Secondary | ICD-10-CM | POA: Diagnosis not present

## 2023-11-07 DIAGNOSIS — N898 Other specified noninflammatory disorders of vagina: Secondary | ICD-10-CM | POA: Diagnosis not present

## 2023-11-07 DIAGNOSIS — F909 Attention-deficit hyperactivity disorder, unspecified type: Secondary | ICD-10-CM

## 2023-11-07 LAB — URINALYSIS, ROUTINE W REFLEX MICROSCOPIC
Bilirubin Urine: NEGATIVE
Ketones, ur: NEGATIVE
Nitrite: NEGATIVE
RBC / HPF: NONE SEEN (ref 0–?)
Specific Gravity, Urine: 1.02 (ref 1.000–1.030)
Total Protein, Urine: NEGATIVE
Urine Glucose: NEGATIVE
Urobilinogen, UA: 2 — AB (ref 0.0–1.0)
pH: 7 (ref 5.0–8.0)

## 2023-11-07 MED ORDER — FLUCONAZOLE 150 MG PO TABS: 150.0000 mg | ORAL_TABLET | Freq: Every day | ORAL | 0 refills | Status: DC

## 2023-11-07 MED ORDER — AMPHETAMINE-DEXTROAMPHETAMINE 5 MG PO TABS: 5.0000 mg | ORAL_TABLET | Freq: Every day | ORAL | 0 refills | Status: DC

## 2023-11-07 MED ORDER — SUMATRIPTAN SUCCINATE 50 MG PO TABS: 50.0000 mg | ORAL_TABLET | Freq: Every day | ORAL | 0 refills | Status: DC | PRN

## 2023-11-07 NOTE — Assessment & Plan Note (Signed)
Chronic, intermittent Patient unable to quantify number of headaches in a month when asked but does report some response to maxalt when taken as needed. Headache not completely aborted. No significant side effects from the maxalt reported by patient today.  Will switch to sumatriptan to be taken as needed for abortive therapy.  We discussed referral to neurology today as well if she has >8 headaches in a month, but she has declined wanting to do this right now.  Follow-up as scheduled in 2 months or sooner as needed.

## 2023-11-07 NOTE — Assessment & Plan Note (Signed)
Acute Likely vaginal yeast infection based on history provided by patient. I do not generally perform vaginal exams and consider this outside of my scope of practice. Will also have patient complete Nuswab for further evaluation. Recommend using soap for sensitive skin to prevent reoccurrence. Will treat with diflucan 150mg  tablet by mouth once.  Further recommendations may be made based on nuswab results.

## 2023-11-07 NOTE — Progress Notes (Signed)
Established Patient Office Visit  Subjective   Patient ID: Miranda Fowler, female    DOB: 30-Jun-1981  Age: 43 y.o. MRN: [id number]  Chief Complaint  Patient presents with   Vaginitis    Discharge and itchiness     Patient arrives today for acute visit. She has multiple concerns as listed below:  Vaginal Discharge: she has symptoms ov vaginal yeast infection. Reports she had yeast infection earlier this year and feels symptoms are similar as they were at that time. She has vaginal discharge, vaginal itching, and discharge is thick and white. She endorses use of new soaps which she thinks may have triggered the symptoms. No dysuria, no hematuria.  Headache: Continues to have intermittent headache associated with nausea. Has taken maxalt with improvement in symptoms but without complete abortion of headache. She also has motion sickness especially with driving. Stress is a frequent trigger of her headaches.  ADHD: Has been an adderall 5mg  by mouth once a day as needed for treatment of ADHD. She reports she felt benefit in regards to attention and focus in the past. Denies any cardiac palpitations or chest pain as a side effect. She would like to restart this medication if possible.     Review of Systems  Respiratory:  Negative for shortness of breath.   Cardiovascular:  Negative for chest pain.  Gastrointestinal:  Positive for nausea.  Neurological:  Positive for headaches.      Objective:     BP 126/76   Pulse 75   Temp 98.3 F (36.8 C) (Temporal)   Ht 5\' 3"  (1.6 m)   Wt 145 lb 8 oz (66 kg)   LMP 11/07/2015   SpO2 100%   BMI 25.77 kg/m  BP Readings from Last 3 Encounters:  11/07/23 126/76  10/20/23 122/77  08/12/23 130/84   Wt Readings from Last 3 Encounters:  11/07/23 145 lb 8 oz (66 kg)  08/12/23 151 lb 6 oz (68.7 kg)  08/07/23 149 lb (67.6 kg)      Physical Exam   No results found for any visits on 11/07/23.    The 10-year ASCVD risk score (Arnett DK,  et al., 2019) is: 0.3%    Assessment & Plan:   Problem List Items Addressed This Visit       Cardiovascular and Mediastinum   Intractable migraine without status migrainosus   Chronic, intermittent Patient unable to quantify number of headaches in a month when asked but does report some response to maxalt when taken as needed. Headache not completely aborted. No significant side effects from the maxalt reported by patient today.  Will switch to sumatriptan to be taken as needed for abortive therapy.  We discussed referral to neurology today as well if she has >8 headaches in a month, but she has declined wanting to do this right now.  Follow-up as scheduled in 2 months or sooner as needed.       Relevant Medications   SUMAtriptan (IMITREX) 50 MG tablet     Other   Attention deficit hyperactivity disorder (ADHD) - Primary   Chronic Reported good response to as needed adderall, would like to restart this.  NCCS database reviewed.  Adderall 5mg  tablets to be taken by mouth once a day as needed for ADHD sent to pharmacy.  Follow-up in 2 months as scheduled or sooner as needed.       Relevant Medications   amphetamine-dextroamphetamine (ADDERALL) 5 MG tablet   Vaginal discharge   Acute Likely  vaginal yeast infection based on history provided by patient. I do not generally perform vaginal exams and consider this outside of my scope of practice. Will also have patient complete Nuswab for further evaluation. Recommend using soap for sensitive skin to prevent reoccurrence. Will treat with diflucan 150mg  tablet by mouth once.  Further recommendations may be made based on nuswab results.       Relevant Medications   fluconazole (DIFLUCAN) 150 MG tablet   Other Relevant Orders   Urinalysis, Routine w reflex microscopic   Urine Culture   NuSwab Vaginitis Plus (VG+)    Return for as scheduled in 2 months.    [name], NP

## 2023-11-07 NOTE — Assessment & Plan Note (Signed)
Chronic Reported good response to as needed adderall, would like to restart this.  NCCS database reviewed.  Adderall 5mg  tablets to be taken by mouth once a day as needed for ADHD sent to pharmacy.  Follow-up in 2 months as scheduled or sooner as needed.

## 2023-11-08 LAB — URINE CULTURE

## 2023-11-09 ENCOUNTER — Encounter: Payer: Self-pay | Admitting: Nurse Practitioner

## 2023-11-11 ENCOUNTER — Encounter: Payer: Self-pay | Admitting: Nurse Practitioner

## 2023-11-11 LAB — NUSWAB VAGINITIS PLUS (VG+)
Candida albicans, NAA: POSITIVE — AB
Candida glabrata, NAA: NEGATIVE
Chlamydia trachomatis, NAA: NEGATIVE
Neisseria gonorrhoeae, NAA: NEGATIVE
Trich vag by NAA: NEGATIVE

## 2023-11-12 ENCOUNTER — Other Ambulatory Visit: Payer: Self-pay | Admitting: Nurse Practitioner

## 2023-11-12 DIAGNOSIS — Z8639 Personal history of other endocrine, nutritional and metabolic disease: Secondary | ICD-10-CM

## 2023-11-12 DIAGNOSIS — E039 Hypothyroidism, unspecified: Secondary | ICD-10-CM

## 2023-11-16 ENCOUNTER — Telehealth: Payer: Self-pay | Admitting: Nurse Practitioner

## 2023-11-16 ENCOUNTER — Other Ambulatory Visit: Payer: Self-pay | Admitting: Nurse Practitioner

## 2023-11-16 NOTE — Telephone Encounter (Signed)
Please call patient and tell her to discontinue her diflucan since she reports it has not helped her symptoms. Because she is having persistent symptoms I would recommend that she see OBGYN to determine if there is another cause to the discharge. Her NuSwab did not identify any evidence of bacterial vaginosis or STI and urine testing was negative for UTI. Thus, verify if she has an OBGYN and if so, recommend that she call them to schedule an appointment and if she does not then I will order referral to one.

## 2023-11-16 NOTE — Telephone Encounter (Signed)
Called pt to relay provider note, pt seem confuse about what I was discussing with her, made pt aware that provider wanted her to stop taking the diflucan and see an OB/GYN to determine the cause of her symptoms. Pt keep repeating she and I quote " yes I try the medication, let pt know no medication is any option as her provider wants her to stop taking the diflucan and to see an OB/GYN, pt stated she is at work and will schedule and appointment.

## 2023-11-17 ENCOUNTER — Ambulatory Visit: Payer: Managed Care, Other (non HMO) | Admitting: Family Medicine

## 2023-11-17 ENCOUNTER — Ambulatory Visit: Payer: Self-pay | Admitting: Nurse Practitioner

## 2023-11-17 VITALS — BP 120/80 | HR 68 | Temp 98.0°F | Ht 63.0 in | Wt 146.0 lb

## 2023-11-17 DIAGNOSIS — M7989 Other specified soft tissue disorders: Secondary | ICD-10-CM | POA: Diagnosis not present

## 2023-11-17 DIAGNOSIS — M79662 Pain in left lower leg: Secondary | ICD-10-CM

## 2023-11-17 NOTE — Patient Instructions (Signed)
Elevated leg above heart.  Start gentle stretching.  Ibuprofen 600  every 8 hours for pain and inflammation.  Call/Mychart if pain is improving or if swelling swelling

## 2023-11-17 NOTE — Progress Notes (Signed)
Patient ID: Miranda Fowler, female    DOB: 12-18-1980, 43 y.o.   MRN: [id number]  This visit was conducted in person.  BP 120/80 (BP Location: Left Arm, Patient Position: Sitting, Cuff Size: Normal)   Pulse 68   Temp 98 F (36.7 C) (Temporal)   Ht 5\' 3"  (1.6 m)   Wt 146 lb (66.2 kg)   LMP 11/07/2015   SpO2 99%   BMI 25.86 kg/m    CC:  Chief Complaint  Patient presents with   Leg Swelling    Left   Leg Pain    Subjective:   HPI: Miranda Fowler is a 43 y.o. female presenting on 11/17/2023 for Leg Swelling (Left) and Leg Pain  She reports new onset left leg pain, noted slight swelling this morning, improved now after elvation.. started suddenly yesterday. 8/10 on pain scale  She has been treating with Miranda Fowler and tylenol.  Has been elevating it at work.  Increased pain with walking, or at rest.   No fall,  no known injury.   No clot history.   No SOB, no chest pain.     Works in child care.  Barely sits down, always busy.  No recent surgery.   No low back pain, no radiation of pain.  No numbness in leg  No weakness.    Relevant past medical, surgical, family and social history reviewed and updated as indicated. Interim medical history since our last visit reviewed. Allergies and medications reviewed and updated. Outpatient Medications Prior to Visit  Medication Sig Dispense Refill   amphetamine-dextroamphetamine (ADDERALL) 5 MG tablet Take 1 tablet (5 mg total) by mouth daily. 30 tablet 0   levocetirizine (XYZAL) 5 MG tablet Take 1 tablet (5 mg total) by mouth every evening. 30 tablet 2   linaclotide (LINZESS) 145 MCG CAPS capsule Take 1 capsule (145 mcg total) by mouth daily before breakfast. 30 capsule 2   meclizine (ANTIVERT) 25 MG tablet Take 1 tablet (25 mg total) by mouth 3 (three) times daily as needed for dizziness. 30 tablet 1   ondansetron (ZOFRAN-ODT) 4 MG disintegrating tablet Take 1 tablet (4 mg total) by mouth every 8 (eight) hours as needed  for nausea or vomiting. 20 tablet 0   SUMAtriptan (IMITREX) 50 MG tablet Take 1 tablet (50 mg total) by mouth daily as needed for migraine. May repeat in 2 hours if headache persists or recurs. 10 tablet 0   SYNTHROID 125 MCG tablet TAKE 1 TABLET BY MOUTH EVERY DAY BEFORE BREAKFAST 90 tablet 3   No facility-administered medications prior to visit.     Per HPI unless specifically indicated in ROS section below Review of Systems  Constitutional:  Negative for fatigue and fever.  HENT:  Negative for congestion.   Eyes:  Negative for pain.  Respiratory:  Negative for cough and shortness of breath.   Cardiovascular:  Negative for chest pain, palpitations and leg swelling.  Gastrointestinal:  Negative for abdominal pain.  Genitourinary:  Negative for dysuria and vaginal bleeding.  Musculoskeletal:  Negative for back pain.  Neurological:  Negative for syncope, light-headedness and headaches.  Psychiatric/Behavioral:  Negative for dysphoric mood.    Objective:  BP 120/80 (BP Location: Left Arm, Patient Position: Sitting, Cuff Size: Normal)   Pulse 68   Temp 98 F (36.7 C) (Temporal)   Ht 5\' 3"  (1.6 m)   Wt 146 lb (66.2 kg)   LMP 11/07/2015   SpO2 99%   BMI 25.86  kg/m   Wt Readings from Last 3 Encounters:  11/17/23 146 lb (66.2 kg)  11/07/23 145 lb 8 oz (66 kg)  08/12/23 151 lb 6 oz (68.7 kg)      Physical Exam Constitutional:      General: She is not in acute distress.    Appearance: Normal appearance. She is well-developed. She is not ill-appearing or toxic-appearing.  HENT:     Head: Normocephalic.     Right Ear: Hearing, tympanic membrane, ear canal and external ear normal. Tympanic membrane is not erythematous, retracted or bulging.     Left Ear: Hearing, tympanic membrane, ear canal and external ear normal. Tympanic membrane is not erythematous, retracted or bulging.     Nose: No mucosal edema or rhinorrhea.     Right Sinus: No maxillary sinus tenderness or frontal sinus  tenderness.     Left Sinus: No maxillary sinus tenderness or frontal sinus tenderness.     Mouth/Throat:     Pharynx: Uvula midline.  Eyes:     General: Lids are normal. Lids are everted, no foreign bodies appreciated.     Conjunctiva/sclera: Conjunctivae normal.     Pupils: Pupils are equal, round, and reactive to light.  Neck:     Thyroid: No thyroid mass or thyromegaly.     Vascular: No carotid bruit.     Trachea: Trachea normal.  Cardiovascular:     Rate and Rhythm: Normal rate and regular rhythm.     Pulses: Normal pulses.     Heart sounds: Normal heart sounds, S1 normal and S2 normal. No murmur heard.    No friction rub. No gallop.     Comments:  Slight sock indention bilaterally Pulmonary:     Effort: Pulmonary effort is normal. No tachypnea or respiratory distress.     Breath sounds: Normal breath sounds. No decreased breath sounds, wheezing, rhonchi or rales.  Abdominal:     General: Bowel sounds are normal.     Palpations: Abdomen is soft.     Tenderness: There is no abdominal tenderness.  Musculoskeletal:     Cervical back: Normal range of motion and neck supple.     Lumbar back: Normal. No tenderness or bony tenderness. Normal range of motion. Negative right straight leg raise test and negative left straight leg raise test.     Right knee: Normal.     Left knee: Normal. No bony tenderness. Normal range of motion. No tenderness.     Right lower leg: No deformity, lacerations, tenderness or bony tenderness. No edema.     Left lower leg: Tenderness present. No deformity, lacerations or bony tenderness. No edema.     Right ankle: Normal range of motion. Normal pulse.     Right Achilles Tendon: Normal.     Left ankle: Normal. Normal range of motion. Normal pulse.     Left Achilles Tendon: Normal.     Comments:  Negative HOman's sign, no varicose veins... only in left calf with movement not compression  Skin:    General: Skin is warm and dry.     Findings: No rash.   Neurological:     Mental Status: She is alert.  Psychiatric:        Mood and Affect: Mood is not anxious or depressed.        Speech: Speech normal.        Behavior: Behavior normal. Behavior is cooperative.        Thought Content: Thought content normal.  Judgment: Judgment normal.       Results for orders placed or performed in visit on 11/07/23  Urine Culture   Collection Time: 11/07/23  9:47 AM   Specimen: GYN  Result Value Ref Range   Source: NOT GIVEN    Status: FINAL    Result:      Less than 10,000 CFU/mL of single Gram positive organism isolated. No further testing will be performed. If clinically indicated, recollection using a method to minimize contamination, with prompt transfer to Urine Culture Transport Tube, is recommended.  NuSwab Vaginitis Plus (VG+)   Collection Time: 11/07/23  9:47 AM  Result Value Ref Range   Atopobium vaginae Low - 0 Score   BVAB 2 Low - 0 Score   Megasphaera 1 Low - 0 Score   Candida albicans, NAA Positive (A) Negative   Candida glabrata, NAA Negative Negative   Trich vag by NAA Negative Negative   Chlamydia trachomatis, NAA Negative Negative   Neisseria gonorrhoeae, NAA Negative Negative  Urinalysis, Routine w reflex microscopic   Collection Time: 11/07/23  9:47 AM  Result Value Ref Range   Color, Urine YELLOW Yellow;Lt. Yellow;Straw;Dark Yellow;Amber;Green;Red;Brown   APPearance Cloudy (A) Clear;Turbid;Slightly Cloudy;Cloudy   Specific Gravity, Urine 1.020 1.000 - 1.030   pH 7.0 5.0 - 8.0   Total Protein, Urine NEGATIVE Negative   Urine Glucose NEGATIVE Negative   Ketones, ur NEGATIVE Negative   Bilirubin Urine NEGATIVE Negative   Hgb urine dipstick TRACE-LYSED (A) Negative   Urobilinogen, UA 2.0 (A) 0.0 - 1.0   Leukocytes,Ua LARGE (A) Negative   Nitrite NEGATIVE Negative   WBC, UA 11-20/hpf (A) 0-2/hpf   RBC / HPF none seen 0-2/hpf   Mucus, UA Presence of (A) None   Squamous Epithelial / HPF Few(5-10/hpf) (A)  Rare(0-4/hpf)   Bacteria, UA Rare(<10/hpf) (A) None   Yeast, UA Presence of (A) None    Assessment and Plan  Pain and swelling of left lower leg Assessment & Plan: Acute, most likely calf muscle strain.  Not clearly suggestive of clot given minimal swelling and negative Homans' sign.  No definite sign of superficial phlebitis. Pain is primarily associated with movement. We will treat with elevation, heat, start gentle stretching (information provided) can use ibuprofen 600 mg 3 times daily as needed for pain and inflammation.  If pain is not improving as expected or if swelling is worsening we will proceed with ultrasound to rule out DVT. Patient agreeable with plan. Return and ER precautions provided.     No follow-ups on file.   Kerby Nora, MD

## 2023-11-17 NOTE — Telephone Encounter (Signed)
  Chief Complaint: L leg swelling Symptoms: swelling, pain Frequency: yesterday Pertinent Negatives: Patient denies injuries, fever, infection sx, SOB, chest pain Disposition: [] ED /[] Urgent Care (no appt availability in office) / [x] Appointment(In office/virtual)/ []  [facility]/ [] Home Care/ [] Refused Recommended Disposition /[] [facility]/ []  Follow-up with PCP Additional Notes: Patient c/o L leg pain and swelling since yesterday. Endorses has been having worsening pain with mild swelling noted. Has taken tylenol and tried topical cream with no relief. Denies SOB, chest pain, redness. Is able to bare weight but painful. Scheduled patient per protocol on 11/17/23 at alternate [facility]. Patient verbalized understanding and to call back with worsening symptoms.     Copied from CRM [id number]. Topic: Clinical - Red Word Triage >> Nov 17, 2023  9:41 AM Desma Mcgregor wrote: Red Word that prompted transfer to Nurse Triage: Left leg in really bad pain, swollen. Hurts to touch. Been cramping for about a month. Wants to make its not a blood clot so trying to come in to be seen today if possible. Reason for Disposition  [1] Thigh, calf, or ankle swelling AND [2] only 1 side  Answer Assessment - Initial Assessment Questions 1. ONSET: "When did the swelling start?" (e.g., minutes, hours, days)     Yesterday Reports having charlie horses in the AM, thought d/t not enough water so been drinking more water and massage with relief. Yesterday became painful to touch, walking, etc 2. LOCATION: "What part of the leg is swollen?"  "Are both legs swollen or just one leg?"     Left, below knee 3. SEVERITY: "How bad is the swelling?" (e.g., localized; mild, moderate, severe)   - Localized: Small area of swelling localized to one leg.   - MILD pedal edema: Swelling limited to foot and ankle, pitting edema < 1/4 inch (6 mm) deep, rest and elevation eliminate most or all swelling.   - MODERATE edema:  Swelling of lower leg to knee, pitting edema > 1/4 inch (6 mm) deep, rest and elevation only partially reduce swelling.   - SEVERE edema: Swelling extends above knee, facial or hand swelling present.      Localized, swelling to front of leg 4. REDNESS: "Does the swelling look red or infected?"     no 5. PAIN: "Is the swelling painful to touch?" If Yes, ask: "How painful is it?"   (Scale 1-10; mild, moderate or severe)     8/10, aching, throbing, constant Took tylenol and topical bengay with no relief 6. FEVER: "Do you have a fever?" If Yes, ask: "What is it, how was it measured, and when did it start?"      no 7. CAUSE: "What do you think is causing the leg swelling?"     Denies injuries. I don't know - happened out of the blue. Thoughts maybe artiritis 8. MEDICAL HISTORY: "Do you have a history of blood clots (e.g., DVT), cancer, heart failure, kidney disease, or liver failure?"     no 9. RECURRENT SYMPTOM: "Have you had leg swelling before?" If Yes, ask: "When was the last time?" "What happened that time?"     No, not like this  10. OTHER SYMPTOMS: "Do you have any other symptoms?" (e.g., chest pain, difficulty breathing)       no  Protocols used: Leg Swelling and Edema-A-AH

## 2023-11-17 NOTE — Assessment & Plan Note (Signed)
Acute, most likely calf muscle strain.  Not clearly suggestive of clot given minimal swelling and negative Homans' sign.  No definite sign of superficial phlebitis. Pain is primarily associated with movement. We will treat with elevation, heat, start gentle stretching (information provided) can use ibuprofen 600 mg 3 times daily as needed for pain and inflammation.  If pain is not improving as expected or if swelling is worsening we will proceed with ultrasound to rule out DVT. Patient agreeable with plan. Return and ER precautions provided.

## 2023-11-28 ENCOUNTER — Telehealth: Payer: Self-pay

## 2023-11-28 ENCOUNTER — Other Ambulatory Visit (HOSPITAL_COMMUNITY): Payer: Self-pay

## 2023-11-28 ENCOUNTER — Encounter: Payer: Self-pay | Admitting: Oncology

## 2023-11-28 NOTE — Telephone Encounter (Signed)
 Copied from CRM [id number]. Topic: Clinical - Prescription Issue >> Nov 25, 2023  5:20 PM Corin V wrote: Reason for CRM: Patient's Synthroid  medication is not covered by insurance. Insurance called to complete the prior authorization. Please complete the auth Monday through Cover My Meds portal or calling [phone], Option 2. She stated this is an urgent request.

## 2023-11-28 NOTE — Telephone Encounter (Signed)
 Pharmacy Patient Advocate Encounter   Received notification from Pt Calls Messages that prior authorization for Synthroid  125mcg is required/requested.   Insurance verification completed.   The patient is insured through [employer] .   Per test claim: PA required; PA submitted to above mentioned insurance via CoverMyMeds Key/confirmation #/EOC [id number] Status is pending

## 2023-12-05 ENCOUNTER — Telehealth: Payer: Self-pay | Admitting: Pharmacist

## 2023-12-05 NOTE — Telephone Encounter (Signed)
 Pharmacy Patient Advocate Encounter  Received notification from [employer] that Prior Authorization for Synthroid has been DENIED.  See denial reason below. No denial letter attached in CMM. Will attach denial letter to Media tab once received.   PA #/Case ID/Reference #: [id number]

## 2023-12-05 NOTE — Telephone Encounter (Signed)
 Appeal has been submitted for branded Synthroid. Will advise when response is received, please be advised that most companies may take 30 days to make a decision. Appeal letter and supporting documentation were faxed to [phone] on 12/05/2023 @ 5:28 pm  Thank you, Dellie Burns, PharmD Clinical Pharmacist  [place]  Direct Dial: [phone]

## 2023-12-06 ENCOUNTER — Telehealth: Payer: Self-pay

## 2023-12-06 DIAGNOSIS — E039 Hypothyroidism, unspecified: Secondary | ICD-10-CM

## 2023-12-06 NOTE — Telephone Encounter (Signed)
 Copied from CRM [id number]. Topic: Clinical - Medication Question >> Dec 06, 2023  4:19 PM Chantha C wrote: Reason for CRM: Patient states the insurance has to take generic of SYNTHROID 125 MCG tablet. Patient cannot take generic and has been off meds for 3 weeks. Patient is feeling overly tired and not herself.  Informed patient 11/28/23 telephone and 12/05/23 telephone- prior auth. Patient verified understanding but need consult on what to do. Please advise and call back at  [phone].

## 2023-12-07 NOTE — Telephone Encounter (Signed)
 Copied from CRM [id number]. Topic: Clinical - Prescription Issue >> Dec 06, 2023  4:39 PM Corin V wrote:  Reason for CRM: Erskine Squibb with patient's insurance is calling to see if patient can take the generic version of the synthroid medication. She was also asking if she can take levothyroxine. Please verify other options due to synthroid being denied and call back to discuss approval for alternate medications.

## 2023-12-07 NOTE — Telephone Encounter (Signed)
 I have spoken with Rosann Auerbach and they have stated we will have to appeal the PA that was denied. I had them fax over this denial so I can move forward with the appeal. They did state we will have to write a letter stating it was tried and failed once we get the denial fax and fax over our information to them.

## 2023-12-20 NOTE — Progress Notes (Unsigned)
   Established Patient Office Visit  Subjective   Patient ID: Miranda Fowler, female    DOB: 1980-11-08  Age: 43 y.o. MRN: [id number]  No chief complaint on file.   HPI  Miranda Fowler is a 43 year old female presents today for transfer of care from Jiles Prows, NP.     {History (Optional):23778}     06/02/2023    4:03 PM 02/07/2023    2:30 PM 12/30/2022   11:33 AM  Depression screen PHQ 2/9  Decreased Interest 0 0 0  Down, Depressed, Hopeless 2 0 0  PHQ - 2 Score 2 0 0  Altered sleeping 2    Tired, decreased energy 2    Change in appetite 1    Feeling bad or failure about yourself  2    Trouble concentrating 3    Moving slowly or fidgety/restless 3    Suicidal thoughts 0    PHQ-9 Score 15    Difficult doing work/chores Very difficult          No data to display            ROS    Objective:     LMP 11/07/2015  {Vitals History (Optional):23777}  Physical Exam   No results found for any visits on 12/21/23.  {Labs (Optional):23779}  The 10-year ASCVD risk score (Arnett DK, et al., 2019) is: 0.2%    Assessment & Plan:  There are no diagnoses linked to this encounter.   No follow-ups on file.    Modesto Charon, NP

## 2023-12-21 ENCOUNTER — Encounter: Payer: Self-pay | Admitting: General Practice

## 2023-12-21 ENCOUNTER — Ambulatory Visit: Payer: Managed Care, Other (non HMO) | Admitting: General Practice

## 2023-12-21 VITALS — BP 112/70 | HR 82 | Temp 98.5°F | Ht 62.5 in | Wt 146.0 lb

## 2023-12-21 DIAGNOSIS — G43019 Migraine without aura, intractable, without status migrainosus: Secondary | ICD-10-CM | POA: Diagnosis not present

## 2023-12-21 DIAGNOSIS — J302 Other seasonal allergic rhinitis: Secondary | ICD-10-CM | POA: Diagnosis not present

## 2023-12-21 DIAGNOSIS — K5900 Constipation, unspecified: Secondary | ICD-10-CM

## 2023-12-21 DIAGNOSIS — Z7689 Persons encountering health services in other specified circumstances: Secondary | ICD-10-CM

## 2023-12-21 DIAGNOSIS — E039 Hypothyroidism, unspecified: Secondary | ICD-10-CM | POA: Diagnosis not present

## 2023-12-21 DIAGNOSIS — F909 Attention-deficit hyperactivity disorder, unspecified type: Secondary | ICD-10-CM | POA: Diagnosis not present

## 2023-12-21 MED ORDER — LEVOCETIRIZINE DIHYDROCHLORIDE 5 MG PO TABS: 5.0000 mg | ORAL_TABLET | Freq: Every evening | ORAL | Status: DC

## 2023-12-21 MED ORDER — LINACLOTIDE 145 MCG PO CAPS: 145.0000 ug | ORAL_CAPSULE | Freq: Every day | ORAL | Status: DC

## 2023-12-21 NOTE — Assessment & Plan Note (Addendum)
 Chronic, intermittent. Neuro exam stable today.   Reports 10 headaches/migraines in February.   Declines neurology referral and preventative treatment options.   Discussed to continue to avoid triggers.   Imitrex has been effective. She will update when she needs a refill.

## 2023-12-21 NOTE — Patient Instructions (Addendum)
 Schedule physical in two weeks.   Keep a long of headache/migraines.   It was nice meeting you.

## 2023-12-21 NOTE — Assessment & Plan Note (Signed)
 EMR reviewed briefly.

## 2023-12-21 NOTE — Assessment & Plan Note (Signed)
 Chronic, controlled.   Reviewed PDMP.   Continue Adderral 5 mg tablets once daily as needed.   She will update when she needs refill.

## 2023-12-21 NOTE — Assessment & Plan Note (Signed)
 Controlled.   Continue xyzal.

## 2023-12-21 NOTE — Assessment & Plan Note (Signed)
 Followed by GI.   Reviewed notes, labs, procedures on care everywhere.   Continue Linzess once daily. Discussed medication adherence at length with patient.   Continue high fiber diet and increase water intake.

## 2023-12-21 NOTE — Assessment & Plan Note (Signed)
 Controlled.   Continue Synthroid 125 mcg. She is taking it correctly.   Follow up in 6-7 weeks to recheck TSH.

## 2023-12-23 ENCOUNTER — Other Ambulatory Visit: Payer: Self-pay | Admitting: Nurse Practitioner

## 2023-12-23 NOTE — Addendum Note (Signed)
 Addended by: Jiles Prows E on: 12/23/2023 10:05 AM   Modules accepted: Orders

## 2023-12-23 NOTE — Telephone Encounter (Signed)
 Referral to pharmacy services ordered to see if they can assist her with obtaining synthroid

## 2023-12-26 ENCOUNTER — Telehealth: Payer: Self-pay | Admitting: *Deleted

## 2023-12-26 NOTE — Progress Notes (Signed)
 Care Guide Pharmacy Note  12/26/2023 Name: Miranda Fowler MRN: [id number] DOB: Jun 01, 1981  Referred By: Modesto Charon, NP Reason for referral: Care Coordination (Outreach to schedule referral with pharmacist )   Miranda Fowler is a 44 y.o. year old female who is a primary care patient of Modesto Charon, NP.  Miranda Fowler was referred to the pharmacist for assistance related to:  hypothyroidism   An unsuccessful telephone outreach was attempted today to contact the patient who was referred to the pharmacy team for assistance with medication assistance. Additional attempts will be made to contact the patient.  Burman Nieves, CMA, Care Guide [facility]  [facility], [facility] Guide Direct Dial: [phone]  Fax: [phone] Website: [email or web address]

## 2023-12-26 NOTE — Progress Notes (Signed)
 Care Guide Pharmacy Note  12/26/2023 Name: Miranda Fowler MRN: [id number] DOB: Feb 17, 1981  Referred By: Modesto Charon, NP Reason for referral: Care Coordination (Outreach to schedule referral with pharmacist )   Miranda Fowler is a 43 y.o. year old female who is a primary care patient of Modesto Charon, NP.  Miranda Fowler was referred to the pharmacist for assistance related to:  Synthroid  Successful contact was made with the patient to discuss pharmacy services including being ready for the pharmacist to call at least 5 minutes before the scheduled appointment time and to have medication bottles and any blood pressure readings ready for review. The patient agreed to meet with the pharmacist via telephone visit on (01/13/2024  Burman Nieves, CMA, Care Guide [facility], [facility] Guide Direct Dial: [phone]  Fax: [phone] Website: [email or web address]

## 2023-12-28 NOTE — Progress Notes (Unsigned)
 ANNUAL EXAM Patient name: Miranda Fowler MRN [id number]  Date of birth: 03-Oct-1981 Chief Complaint:   No chief complaint on file.  History of Present Illness:   Miranda Fowler is a 43 y.o. [phone] {race:25618} female being seen today for a routine annual exam.   Current complaints: ***  Patient's last menstrual period was 11/07/2015.  The pregnancy intention screening data noted above was reviewed. Potential methods of contraception were discussed. The patient elected to proceed with No data recorded.   Last pap 10/27/2022 ASCUS, HPV negative.  Last mammogram: ***. Results were: {normal, abnormal, n/a:23837}. Family h/o breast cancer: {yes***/no:23838} Last colonoscopy: ***. Results were: {normal, abnormal, n/a:23837}. Family h/o colorectal cancer: {yes***/no:23838} STI screening: Contraception:     12/21/2023    3:19 PM 06/02/2023    4:03 PM 02/07/2023    2:30 PM 12/30/2022   11:33 AM 08/24/2022    2:58 PM  Depression screen PHQ 2/9  Decreased Interest 0 0 0 0 0  Down, Depressed, Hopeless 0 2 0 0 0  PHQ - 2 Score 0 2 0 0 0  Altered sleeping 2 2     Tired, decreased energy 1 2     Change in appetite 0 1     Feeling bad or failure about yourself  2 2     Trouble concentrating 3 3     Moving slowly or fidgety/restless 3 3     Suicidal thoughts 0 0     PHQ-9 Score 11 15     Difficult doing work/chores Somewhat difficult Very difficult           12/21/2023    3:19 PM  GAD 7 : Generalized Anxiety Score  Nervous, Anxious, on Edge 3  Control/stop worrying 3  Worry too much - different things 3  Trouble relaxing 2  Restless 2  Easily annoyed or irritable 2  Afraid - awful might happen 3  Total GAD 7 Score 18  Anxiety Difficulty Somewhat difficult     Review of Systems:   Pertinent items are noted in HPI Denies any headaches, blurred vision, fatigue, shortness of breath, chest pain, abdominal pain, abnormal vaginal discharge/itching/odor/irritation, problems with  periods, bowel movements, urination, or intercourse unless otherwise stated above. Pertinent History Reviewed:  Reviewed past medical,surgical, social and family history.  Reviewed problem list, medications and allergies. Physical Assessment:  There were no vitals filed for this visit.There is no height or weight on file to calculate BMI.        Physical Examination:   General appearance - well appearing, and in no distress  Mental status - alert, oriented to person, place, and time  Psych:  She has a normal mood and affect  Skin - warm and dry, normal color, no suspicious lesions noted  Chest - effort normal, all lung fields clear to auscultation bilaterally  Heart - normal rate and regular rhythm  Neck:  midline trachea, no thyromegaly or nodules  Breasts - breasts appear normal, no suspicious masses, no skin or nipple changes or  axillary nodes  Abdomen - soft, nontender, nondistended, no masses or organomegaly  Pelvic - VULVA: normal appearing vulva with no masses, tenderness or lesions  VAGINA: normal appearing vagina with normal color and discharge, no lesions  CERVIX: normal appearing cervix without discharge or lesions, no CMT  Thin prep pap is {Desc; done/not:10129} *** HR HPV cotesting  UTERUS: uterus is felt to be normal size, shape, consistency and nontender   ADNEXA: No adnexal  masses or tenderness noted.  Rectal - normal rectal, good sphincter tone, no masses felt. Hemoccult: ***  Extremities:  No swelling or varicosities noted  Chaperone present for exam  No results found for this or any previous visit (from the past 24 hours).  Assessment & Plan:  1. Encounter for annual routine gynecological examination (Primary) 2. Cervical cancer screening - Cervical cancer screening: Discussed guidelines. Pap with HPV *** - Gardasil: {Blank single:19197::"***","has not yet had. Will provide information","completed","has not yet had. Counseling provided and she declines","Has not yet  had. Counseling provided and pt accepts"} - STD Testing: {Blank single:19197::"accepts","declines","not indicated"} - Birth Control: Discussed options and their risks, benefits and common side effects; discussed VTE with estrogen containing options. Desires: {Birth control type:23956} - Breast Health: Encouraged self breast awareness/SBE. Teaching provided. Discussed limits of clinical breast exam for detecting breast cancer.  - Mammogram: {Mammo f/u:25212::"@ 43yo"}, or sooner if problems - Colonoscopy: {TCS f/u:25213::"@ 43yo"}, or sooner if problems{Blank single:19197::"Rx given for [facility] is up to date: ***"} - F/U 12 months and prn   No orders of the defined types were placed in this encounter.  Meds: No orders of the defined types were placed in this encounter.  Follow-up: No follow-ups on file.  Ralene Muskrat, [place] 12/28/2023 8:07 PM

## 2023-12-29 ENCOUNTER — Other Ambulatory Visit (HOSPITAL_COMMUNITY)
Admission: RE | Admit: 2023-12-29 | Discharge: 2023-12-29 | Disposition: A | Discharge status: Home / Self Care | Source: Ambulatory Visit | Attending: Physician Assistant | Admitting: Physician Assistant

## 2023-12-29 ENCOUNTER — Ambulatory Visit (INDEPENDENT_AMBULATORY_CARE_PROVIDER_SITE_OTHER): Payer: Managed Care, Other (non HMO) | Admitting: Physician Assistant

## 2023-12-29 ENCOUNTER — Encounter: Payer: Self-pay | Admitting: Physician Assistant

## 2023-12-29 VITALS — BP 120/85 | HR 77 | Ht 62.0 in | Wt 147.9 lb

## 2023-12-29 DIAGNOSIS — Z1231 Encounter for screening mammogram for malignant neoplasm of breast: Secondary | ICD-10-CM

## 2023-12-29 DIAGNOSIS — Z113 Encounter for screening for infections with a predominantly sexual mode of transmission: Secondary | ICD-10-CM

## 2023-12-29 DIAGNOSIS — Z01419 Encounter for gynecological examination (general) (routine) without abnormal findings: Secondary | ICD-10-CM

## 2023-12-29 DIAGNOSIS — Z124 Encounter for screening for malignant neoplasm of cervix: Secondary | ICD-10-CM

## 2023-12-29 NOTE — Progress Notes (Signed)
 Pt presents for annual. Pt complains of recurring yeast infections.

## 2023-12-30 ENCOUNTER — Encounter: Payer: Managed Care, Other (non HMO) | Admitting: Nurse Practitioner

## 2023-12-30 LAB — CERVICOVAGINAL ANCILLARY ONLY
Bacterial Vaginitis (gardnerella): NEGATIVE
Candida Glabrata: NEGATIVE
Candida Vaginitis: NEGATIVE
Chlamydia: NEGATIVE
Comment: NEGATIVE
Comment: NEGATIVE
Comment: NEGATIVE
Comment: NEGATIVE
Comment: NEGATIVE
Comment: NORMAL
Neisseria Gonorrhea: NEGATIVE
Trichomonas: NEGATIVE

## 2023-12-31 LAB — HEPB+HEPC+HIV PANEL
HIV Screen 4th Generation wRfx: NONREACTIVE
Hep B C IgM: NEGATIVE
Hep B Core Total Ab: NEGATIVE
Hep B E Ab: NONREACTIVE
Hep B E Ag: NEGATIVE
Hep B Surface Ab, Qual: NONREACTIVE
Hep C Virus Ab: NONREACTIVE
Hepatitis B Surface Ag: NEGATIVE

## 2023-12-31 LAB — RPR, QUANT+TP ABS (REFLEX)
Rapid Plasma Reagin, Quant: 1:1 {titer} — ABNORMAL HIGH
T Pallidum Abs: NONREACTIVE

## 2023-12-31 LAB — RPR: RPR Ser Ql: REACTIVE — AB

## 2024-01-04 ENCOUNTER — Other Ambulatory Visit: Payer: Self-pay | Admitting: General Practice

## 2024-01-04 DIAGNOSIS — J302 Other seasonal allergic rhinitis: Secondary | ICD-10-CM

## 2024-01-04 NOTE — Telephone Encounter (Signed)
 Copied from CRM [id number]. Topic: Clinical - Medication Refill >> Jan 04, 2024  4:28 PM Orinda Kenner C wrote: Most Recent Primary Care Visit:  Provider: Modesto Charon  Department: [facility]  Visit Type: TRANSFER OF CARE  Date: 12/21/2023  Medication:  levocetirizine (XYZAL) 5 MG tablet   Has the patient contacted their pharmacy? Yes, patient went to the pharmacy and there's no medication authorized. (Agent: If no, request that the patient contact the pharmacy for the refill. If patient does not wish to contact the pharmacy document the reason why and proceed with request.) (Agent: If yes, when and what did the pharmacy advise?)  Is this the correct pharmacy for this prescription? Yes If no, delete pharmacy and type the correct one.  This is the patient's preferred pharmacy:  [facility] [phone] - [place], [place] - [address] AT [facility] [address]  [place] [address] Phone: [phone] Fax: [phone]   Has the prescription been filled recently? No  Is the patient out of the medication? Yes for about 2-3 months.   Has the patient been seen for an appointment in the last year OR does the patient have an upcoming appointment? Yes, 01/13/24  Can we respond through MyChart? No, please call [phone]  Agent: Please be advised that Rx refills may take up to 3 business days. We ask that you follow-up with your pharmacy.

## 2024-01-05 ENCOUNTER — Ambulatory Visit: Admitting: General Practice

## 2024-01-05 ENCOUNTER — Other Ambulatory Visit (HOSPITAL_COMMUNITY): Payer: Self-pay

## 2024-01-05 MED ORDER — LEVOCETIRIZINE DIHYDROCHLORIDE 5 MG PO TABS: 5.0000 mg | ORAL_TABLET | Freq: Every evening | ORAL | 2 refills | Status: DC

## 2024-01-13 ENCOUNTER — Encounter: Payer: Self-pay | Admitting: General Practice

## 2024-01-13 ENCOUNTER — Other Ambulatory Visit: Payer: Self-pay | Admitting: Nurse Practitioner

## 2024-01-13 ENCOUNTER — Ambulatory Visit (INDEPENDENT_AMBULATORY_CARE_PROVIDER_SITE_OTHER): Admitting: General Practice

## 2024-01-13 ENCOUNTER — Other Ambulatory Visit

## 2024-01-13 VITALS — BP 112/78 | HR 62 | Temp 98.1°F | Ht 62.0 in | Wt 148.0 lb

## 2024-01-13 DIAGNOSIS — J302 Other seasonal allergic rhinitis: Secondary | ICD-10-CM

## 2024-01-13 DIAGNOSIS — F419 Anxiety disorder, unspecified: Secondary | ICD-10-CM

## 2024-01-13 DIAGNOSIS — Z8639 Personal history of other endocrine, nutritional and metabolic disease: Secondary | ICD-10-CM

## 2024-01-13 DIAGNOSIS — E039 Hypothyroidism, unspecified: Secondary | ICD-10-CM

## 2024-01-13 DIAGNOSIS — Z13220 Encounter for screening for lipoid disorders: Secondary | ICD-10-CM | POA: Diagnosis not present

## 2024-01-13 DIAGNOSIS — Z0000 Encounter for general adult medical examination without abnormal findings: Secondary | ICD-10-CM | POA: Diagnosis not present

## 2024-01-13 DIAGNOSIS — Z0001 Encounter for general adult medical examination with abnormal findings: Secondary | ICD-10-CM

## 2024-01-13 DIAGNOSIS — F909 Attention-deficit hyperactivity disorder, unspecified type: Secondary | ICD-10-CM

## 2024-01-13 DIAGNOSIS — Z23 Encounter for immunization: Secondary | ICD-10-CM

## 2024-01-13 LAB — COMPREHENSIVE METABOLIC PANEL WITH GFR
ALT: 12 U/L (ref 0–35)
AST: 15 U/L (ref 0–37)
Albumin: 3.9 g/dL (ref 3.5–5.2)
Alkaline Phosphatase: 73 U/L (ref 39–117)
BUN: 12 mg/dL (ref 6–23)
CO2: 30 meq/L (ref 19–32)
Calcium: 9.6 mg/dL (ref 8.4–10.5)
Chloride: 104 meq/L (ref 96–112)
Creatinine, Ser: 0.74 mg/dL (ref 0.40–1.20)
GFR: 99.26 mL/min (ref >60.00)
Glucose, Bld: 95 mg/dL (ref 70–99)
Potassium: 3.5 meq/L (ref 3.5–5.1)
Sodium: 140 meq/L (ref 135–145)
Total Bilirubin: 0.6 mg/dL (ref 0.2–1.2)
Total Protein: 7.1 g/dL (ref 6.0–8.3)

## 2024-01-13 LAB — LIPID PANEL
Cholesterol: 179 mg/dL (ref 0–200)
HDL: 75.9 mg/dL (ref >39.00)
LDL Cholesterol: 98 mg/dL (ref 0–99)
NonHDL: 103.53
Total CHOL/HDL Ratio: 2
Triglycerides: 30 mg/dL (ref 0.0–149.0)
VLDL: 6 mg/dL (ref 0.0–40.0)

## 2024-01-13 LAB — CBC
HCT: 35.3 % — ABNORMAL LOW (ref 36.0–46.0)
Hemoglobin: 11.9 g/dL — ABNORMAL LOW (ref 12.0–15.0)
MCHC: 33.6 g/dL (ref 30.0–36.0)
MCV: 100.9 fl — ABNORMAL HIGH (ref 78.0–100.0)
Platelets: 267 10*3/uL (ref 150.0–400.0)
RBC: 3.49 Mil/uL — ABNORMAL LOW (ref 3.87–5.11)
RDW: 11.7 % (ref 11.5–15.5)
WBC: 3.4 10*3/uL — ABNORMAL LOW (ref 4.0–10.5)

## 2024-01-13 LAB — TSH: TSH: 2.06 u[IU]/mL (ref 0.35–5.50)

## 2024-01-13 MED ORDER — LEVOCETIRIZINE DIHYDROCHLORIDE 5 MG PO TABS: 5.0000 mg | ORAL_TABLET | Freq: Every evening | ORAL | 0 refills | Status: AC

## 2024-01-13 MED ORDER — AMPHETAMINE-DEXTROAMPHETAMINE 5 MG PO TABS: 5.0000 mg | ORAL_TABLET | Freq: Every day | ORAL | 0 refills | Status: DC

## 2024-01-13 NOTE — Patient Instructions (Addendum)
 Stop by the lab prior to leaving today. I will notify you of your results once received.   You will get a phone call regarding the ultrasound appointment or you can call the number I gave you.   Follow up in 3 months for ADHD medication.   It was a pleasure to see you today!

## 2024-01-13 NOTE — Assessment & Plan Note (Signed)
 Controlled.  Reveiwed PDMP.   Non opoid contract signed.  UDS ordered.   Continue Adderall 5 mg once daily as needed.  Refill sent.

## 2024-01-13 NOTE — Assessment & Plan Note (Addendum)
 Immunizations tetanus shot uptdated today, declines influenza vaccine. Pap smear UTD. Mammogram due, orders placed by GYN. Colonoscopy UTD  Discussed the importance of a healthy diet and regular exercise in order for weight loss, and to reduce the risk of further co-morbidity.  Exam stable. Labs pending.  Follow up in 1 year for repeat physical.

## 2024-01-13 NOTE — Assessment & Plan Note (Signed)
 Uncontrolled.   Declines medication or therapy at this time.

## 2024-01-13 NOTE — Assessment & Plan Note (Addendum)
 Controlled.  Nodule felt on right side of thyroid gland.  Continue synthroid 125 mcg.  She is taking it correctly. Discussed medication adherence.  Repeat TSH pending.  Order placed for thyroid ultrasound.

## 2024-01-13 NOTE — Progress Notes (Addendum)
 Established Patient Office Visit  Subjective   Patient ID: Miranda Fowler, female    DOB: 1981-03-17  Age: 43 y.o. MRN: [id number]  Chief Complaint  Patient presents with   Annual Exam    HPI  Miranda Fowler is a 43 year old female with past medical history of migraines, hypothyroidism, IDA, chronic fatigue, anxiety, ADHD, HLD, mild TBI, presents today for complete physical and follow up of chronic conditions.  Immunizations: -Tetanus: Completed in 2012 -Influenza: due  Diet: Fair diet.  Exercise: regular exercise.  Eye exam: Completes annually  Dental exam: Completes semi-annually    Pap Smear: vaginal hysterectomy 2017; last pap 10/27/22 ASCUS, HPV negative- OBGYN. Mammogram: Completed in 2023- ordered by [facility] on 12/29/23. Colonoscopy: 10/25/22; normal; family history of CRC- paternal uncle.  Hypothyroidism: chronic. Has been managed on Synthroid 125 mcg. Patient has failed levothyroxine. Multiple PA's have been done but hasn't been able to get it approved from her insurance. She has been purchasing the synthroid out of pocket and has been rationing her medication. Per patient, she went to the GYN on 12/29/23 who felt a nodule on her thyroid gland on exam. She reports having radiation 20 years ago on her thyroid. She is not sure if she had cancer. But she was given the option for radiation or surgery. She chose radiation. She does not remember where but she does know it was 20 + years ago. She denies any difficulty swallowing or pain. She has an appointment with pharmacist to discuss prescription assistance.    Patient Active Problem List   Diagnosis Date Noted   Seasonal allergies 12/21/2023   Transfer requested for continuity of care 12/21/2023   Pain and swelling of left lower leg 11/17/2023   Vaginal discharge 11/07/2023   Dizziness 08/12/2023   Constipation 08/12/2023   Intractable migraine without status migrainosus 08/12/2023   Heme positive stool  03/29/2023   Encounter for routine adult physical exam with abnormal findings 12/30/2022   Hyperlipidemia 12/30/2022   Diabetes mellitus screening 12/30/2022   Acute sinusitis 12/30/2022   H/O dysfunctional uterine bleeding 08/31/2022   Medication intolerance 08/31/2022   Mild traumatic brain injury (HCC) 08/24/2022   Nocturnal leg cramps 08/05/2022   Attention deficit hyperactivity disorder (ADHD) 08/05/2022   Leukopenia 04/02/2022   Unintentional weight loss 02/12/2022   Vaginal high risk HPV DNA test positive 07/20/2021   Abnormal echocardiogram 08/30/2018   Anxiety 03/22/2018   Attention and concentration deficit 01/05/2018   Forgetfulness 01/05/2018   Daytime somnolence 07/08/2016   Snoring 07/08/2016   Chronic fatigue 07/08/2016   Knee pain, bilateral 07/08/2016   Chronic pain of both knees 07/08/2016   Anemia 11/07/2015   Hypothyroidism 04/06/2010   Iron deficiency anemia 04/06/2010   Past Medical History:  Diagnosis Date   Anemia    Chronic problem for patient.  Followed by Heme, receives IV iron transfusions secondary to inability to tolerate PO supplements.     Arthritis    bilateral knees   Headache    Migraines   History of ADHD 02/12/2022   Hypothyroidism    post-radiation at age 68   Shortness of breath dyspnea    Vaginal high risk HPV DNA test positive 07/20/2021   Past Surgical History:  Procedure Laterality Date   BILATERAL SALPINGECTOMY Bilateral 10/18/2006   pt denies   BREAST BIOPSY Right 10/05/2022   MM RT BREAST BX W LOC DEV 1ST LESION IMAGE BX SPEC STEREO GUIDE 10/05/2022 [facility]  BREAST BIOPSY Left 10/05/2022   MM LT BREAST BX W LOC DEV 1ST LESION IMAGE BX SPEC STEREO GUIDE 10/05/2022 [facility]   CESAREAN SECTION     CYSTOSCOPY N/A 04/06/2016   Procedure: CYSTOSCOPY;  Surgeon: Ok Edwards, MD;  Location: [facility];  Service: Gynecology;  Laterality: N/A;   ENTEROSCOPY N/A 02/03/2023   Procedure: ENTEROSCOPY;  Surgeon:  Napoleon Form, MD;  Location: [facility];  Service: Gastroenterology;  Laterality: N/A;  Push-enteroscopy   LAPAROSCOPIC ASSISTED VAGINAL HYSTERECTOMY N/A 04/06/2016   Procedure: LAPAROSCOPIC ASSISTED VAGINAL HYSTERECTOMY,  Bilateral Salpingectomy;  Surgeon: Ok Edwards, MD;  Location: [facility];  Service: Gynecology;  Laterality: N/A;   TUBAL LIGATION     No Known Allergies       01/13/2024   10:55 AM 12/21/2023    3:19 PM 06/02/2023    4:03 PM  Depression screen PHQ 2/9  Decreased Interest 0 0 0  Down, Depressed, Hopeless 0 0 2  PHQ - 2 Score 0 0 2  Altered sleeping 2 2 2   Tired, decreased energy 0 1 2  Change in appetite 0 0 1  Feeling bad or failure about yourself  0 2 2  Trouble concentrating 3 3 3   Moving slowly or fidgety/restless 2 3 3   Suicidal thoughts 0 0 0  PHQ-9 Score 7 11 15   Difficult doing work/chores Somewhat difficult Somewhat difficult Very difficult       01/13/2024   10:55 AM 12/21/2023    3:19 PM  GAD 7 : Generalized Anxiety Score  Nervous, Anxious, on Edge 3 3  Control/stop worrying 3 3  Worry too much - different things 3 3  Trouble relaxing 2 2  Restless 2 2  Easily annoyed or irritable 2 2  Afraid - awful might happen 2 3  Total GAD 7 Score 17 18  Anxiety Difficulty Somewhat difficult Somewhat difficult      Review of Systems  Constitutional:  Negative for chills, fever, malaise/fatigue and weight loss.  HENT:  Negative for congestion, ear discharge, ear pain, hearing loss, nosebleeds, sinus pain, sore throat and tinnitus.   Eyes:  Negative for blurred vision, double vision, pain, discharge and redness.  Respiratory:  Negative for cough, shortness of breath, wheezing and stridor.   Cardiovascular:  Negative for chest pain, palpitations and leg swelling.  Gastrointestinal:  Negative for abdominal pain, constipation, diarrhea, heartburn, nausea and vomiting.  Genitourinary:  Negative for dysuria, frequency and urgency.  Musculoskeletal:   Negative for myalgias.  Skin:  Negative for rash.  Neurological:  Negative for dizziness, tingling, seizures, weakness and headaches.  Psychiatric/Behavioral:  Negative for depression, substance abuse and suicidal ideas. The patient is not nervous/anxious.       Objective:     BP 112/78 (BP Location: Left Arm, Patient Position: Sitting, Cuff Size: Normal)   Pulse 62   Temp 98.1 F (36.7 C) (Oral)   Ht 5\' 2"  (1.575 m)   Wt 148 lb (67.1 kg)   LMP 11/07/2015   SpO2 99%   BMI 27.07 kg/m  BP Readings from Last 3 Encounters:  01/13/24 112/78  12/29/23 120/85  12/21/23 112/70   Wt Readings from Last 3 Encounters:  01/13/24 148 lb (67.1 kg)  12/29/23 147 lb 14.4 oz (67.1 kg)  12/21/23 146 lb (66.2 kg)      Physical Exam Vitals and nursing note reviewed.  Constitutional:      Appearance: Normal appearance.  HENT:     Head: Normocephalic  and atraumatic.     Right Ear: Tympanic membrane, ear canal and external ear normal.     Left Ear: Tympanic membrane, ear canal and external ear normal.     Nose: Nose normal.     Mouth/Throat:     Mouth: Mucous membranes are moist.     Pharynx: Oropharynx is clear.  Eyes:     Conjunctiva/sclera: Conjunctivae normal.     Pupils: Pupils are equal, round, and reactive to light.  Neck:     Thyroid: Thyromegaly present. No thyroid mass or thyroid tenderness.      Comments: Thyroid nodule palpated on exam. Cardiovascular:     Rate and Rhythm: Normal rate and regular rhythm.     Pulses: Normal pulses.     Heart sounds: Normal heart sounds.  Pulmonary:     Effort: Pulmonary effort is normal.     Breath sounds: Normal breath sounds.  Abdominal:     General: Abdomen is flat. Bowel sounds are normal.     Palpations: Abdomen is soft.  Musculoskeletal:        General: Normal range of motion.     Cervical back: Normal range of motion.  Skin:    General: Skin is warm and dry.     Capillary Refill: Capillary refill takes less than 2 seconds.   Neurological:     General: No focal deficit present.     Mental Status: She is alert and oriented to person, place, and time. Mental status is at baseline.  Psychiatric:        Mood and Affect: Mood normal.        Behavior: Behavior normal.        Thought Content: Thought content normal.        Judgment: Judgment normal.      No results found for any visits on 01/13/24.     The 10-year ASCVD risk score (Arnett DK, et al., 2019) is: 0.2%    Assessment & Plan:  Encounter for routine adult physical exam with abnormal findings Assessment & Plan: Immunizations tetanus shot uptdated today, declines influenza vaccine. Pap smear UTD. Mammogram due, orders placed by GYN. Colonoscopy UTD  Discussed the importance of a healthy diet and regular exercise in order for weight loss, and to reduce the risk of further co-morbidity.  Exam stable. Labs pending.  Follow up in 1 year for repeat physical.   Encounter for screening and preventative care -     CBC -     Comprehensive metabolic panel with GFR -     Lipid panel -     TSH  Seasonal allergies Assessment & Plan: Controlled.   Continue Xyzal.  Refill sent.  Orders: -     Levocetirizine Dihydrochloride; Take 1 tablet (5 mg total) by mouth every evening.  Dispense: 90 tablet; Refill: 0  Attention deficit hyperactivity disorder (ADHD), unspecified ADHD type Assessment & Plan: Controlled.  Reveiwed PDMP.   Non opoid contract signed.  UDS ordered.   Continue Adderall 5 mg once daily as needed.  Refill sent.  Orders: -     Amphetamine-Dextroamphetamine; Take 1 tablet (5 mg total) by mouth daily.  Dispense: 30 tablet; Refill: 0 -     DRUG MONITORING, PANEL 8 WITH CONFIRMATION, URINE  Hypothyroidism, unspecified type Assessment & Plan: Controlled.  Nodule felt on right side of thyroid gland.  Continue synthroid 125 mcg.  She is taking it correctly. Discussed medication adherence.  Repeat TSH pending.  Order  placed for thyroid  ultrasound.   Orders: -     TSH -     US THYROID; Future  Need for Tdap vaccination -     Tdap vaccine greater than or equal to 7yo IM  Anxiety Assessment & Plan: Uncontrolled.   Declines medication or therapy at this time.    Return in about 3 months (around 04/14/2024) for adhd medication.    Modesto Charon, NP

## 2024-01-13 NOTE — Progress Notes (Signed)
   01/13/2024 Name: Miranda Fowler MRN: [id number] DOB: 1981/06/09  Chief Complaint  Patient presents with   Medication access    Miranda Fowler is a 43 y.o. year old female who presented for a telephone visit.   They were referred to the pharmacist by their PCP for assistance in managing medication access.   Referral was sent prior to pt changing PCP.  Pt having trouble getting brand Synthroid. She has been paying out of pocket which has been >$100 per 30 DS.   Per chart review, prior Berkley Harvey has been done and an appeal was submitted 2/17. Do not see that an appeal response has been uploaded.  Called pt's pharmacy to ask them to run the Synthroid Rx - they state it is still denying requiring a PA. Sent message to appeals pharmacist who contacted insurance, it is still under review  On 3/31, a message was in chart that a test claim was ran for Synthroid and it went through for $99 for 90 DS. Informed patient of price and advised her to contact her pharmacy to ask them to fill the Rx.  No further action needed.  Arbutus Leas, PharmD, BCPS, CPP Clinical Pharmacist Practitioner [facility] [phone]

## 2024-01-13 NOTE — Assessment & Plan Note (Signed)
 Controlled.   Continue Xyzal.  Refill sent.

## 2024-01-14 LAB — DRUG MONITORING, PANEL 8 WITH CONFIRMATION, URINE
6 Acetylmorphine: NEGATIVE ng/mL (ref <10)
Alcohol Metabolites: NEGATIVE ng/mL (ref <500)
Amphetamines: NEGATIVE ng/mL (ref <500)
Benzodiazepines: NEGATIVE ng/mL (ref <100)
Buprenorphine, Urine: NEGATIVE ng/mL (ref <5)
Cocaine Metabolite: NEGATIVE ng/mL (ref <150)
Creatinine: 194.8 mg/dL (ref >=20.0)
MDMA: NEGATIVE ng/mL (ref <500)
Marijuana Metabolite: NEGATIVE ng/mL (ref <20)
Opiates: NEGATIVE ng/mL (ref <100)
Oxidant: NEGATIVE ug/mL (ref <200)
Oxycodone: NEGATIVE ng/mL (ref <100)
pH: 6.9 (ref 4.5–9.0)

## 2024-01-14 LAB — DM TEMPLATE

## 2024-01-16 ENCOUNTER — Telehealth: Payer: Self-pay | Admitting: Pharmacy Technician

## 2024-01-16 ENCOUNTER — Other Ambulatory Visit

## 2024-01-16 ENCOUNTER — Other Ambulatory Visit (HOSPITAL_COMMUNITY): Payer: Self-pay

## 2024-01-16 NOTE — Telephone Encounter (Signed)
 Pharmacy Patient Advocate Encounter   Received notification from Onbase that prior authorization for SYNTHROID is required/requested.   Insurance verification completed.   The patient is insured through [employer] .   Per test claim: The current 90 day co-pay is, $99.00.  No PA needed at this time. This test claim was processed through [facility]- copay amounts may vary at other pharmacies due to pharmacy/plan contracts, or as the patient moves through the different stages of their insurance plan.

## 2024-01-20 ENCOUNTER — Other Ambulatory Visit

## 2024-01-27 ENCOUNTER — Ambulatory Visit
Admission: RE | Admit: 2024-01-27 | Discharge: 2024-01-27 | Disposition: A | Discharge status: Home / Self Care | Source: Ambulatory Visit | Attending: General Practice | Admitting: General Practice

## 2024-01-27 DIAGNOSIS — E039 Hypothyroidism, unspecified: Secondary | ICD-10-CM

## 2024-01-30 ENCOUNTER — Encounter: Payer: Self-pay | Admitting: General Practice

## 2024-01-31 NOTE — Telephone Encounter (Signed)
 Called patient and she has questions about what her US  results mean. I did not have the answer to her question. Patient advised that you were out this afternoon and we would give her a call back tomorrow to discuss this further.

## 2024-02-01 NOTE — Telephone Encounter (Signed)
 Called and discussed results at length with patient.  Declines personal history of thyroid cancer.   Continue synthroid as prescribed.

## 2024-02-01 NOTE — Telephone Encounter (Signed)
 Called patient back and left message.

## 2024-02-14 ENCOUNTER — Other Ambulatory Visit (HOSPITAL_COMMUNITY): Payer: Self-pay

## 2024-09-21 ENCOUNTER — Other Ambulatory Visit: Payer: Self-pay

## 2024-09-21 ENCOUNTER — Encounter: Payer: Self-pay | Admitting: Intensive Care

## 2024-09-21 ENCOUNTER — Encounter: Payer: Self-pay | Admitting: Nurse Practitioner

## 2024-09-21 ENCOUNTER — Ambulatory Visit: Admitting: Nurse Practitioner

## 2024-09-21 ENCOUNTER — Emergency Department
Admission: EM | Admit: 2024-09-21 | Discharge: 2024-09-21 | Disposition: A | Discharge status: Home / Self Care | Source: Ambulatory Visit | Attending: Emergency Medicine | Admitting: Emergency Medicine

## 2024-09-21 VITALS — BP 120/76 | HR 68 | Temp 97.9°F | Ht 62.0 in | Wt 150.2 lb

## 2024-09-21 DIAGNOSIS — H538 Other visual disturbances: Secondary | ICD-10-CM

## 2024-09-21 DIAGNOSIS — D72819 Decreased white blood cell count, unspecified: Secondary | ICD-10-CM | POA: Insufficient documentation

## 2024-09-21 DIAGNOSIS — G43819 Other migraine, intractable, without status migrainosus: Secondary | ICD-10-CM | POA: Insufficient documentation

## 2024-09-21 DIAGNOSIS — R200 Anesthesia of skin: Secondary | ICD-10-CM

## 2024-09-21 DIAGNOSIS — R202 Paresthesia of skin: Secondary | ICD-10-CM | POA: Diagnosis not present

## 2024-09-21 DIAGNOSIS — R519 Headache, unspecified: Secondary | ICD-10-CM | POA: Diagnosis not present

## 2024-09-21 DIAGNOSIS — G43919 Migraine, unspecified, intractable, without status migrainosus: Secondary | ICD-10-CM

## 2024-09-21 HISTORY — DX: Migraine, unspecified, not intractable, without status migrainosus: G43.909

## 2024-09-21 LAB — COMPREHENSIVE METABOLIC PANEL WITH GFR
ALT: 14 U/L (ref 0–44)
AST: 19 U/L (ref 15–41)
Albumin: 4.4 g/dL (ref 3.5–5.0)
Alkaline Phosphatase: 83 U/L (ref 38–126)
Anion gap: 10 (ref 5–15)
BUN: 12 mg/dL (ref 6–20)
CO2: 27 mmol/L (ref 22–32)
Calcium: 10.1 mg/dL (ref 8.9–10.3)
Chloride: 101 mmol/L (ref 98–111)
Creatinine, Ser: 0.79 mg/dL (ref 0.44–1.00)
GFR, Estimated: >60 mL/min (ref >60)
Glucose, Bld: 81 mg/dL (ref 70–99)
Potassium: 4.2 mmol/L (ref 3.5–5.1)
Sodium: 138 mmol/L (ref 135–145)
Total Bilirubin: 0.6 mg/dL (ref 0.0–1.2)
Total Protein: 8.3 g/dL — ABNORMAL HIGH (ref 6.5–8.1)

## 2024-09-21 LAB — CBC WITH DIFFERENTIAL/PLATELET
Abs Immature Granulocytes: 0 K/uL (ref 0.00–0.07)
Basophils Absolute: 0 K/uL (ref 0.0–0.1)
Basophils Relative: 1 %
Eosinophils Absolute: 0 K/uL (ref 0.0–0.5)
Eosinophils Relative: 0 %
HCT: 36.6 % (ref 36.0–46.0)
Hemoglobin: 12.1 g/dL (ref 12.0–15.0)
Immature Granulocytes: 0 %
Lymphocytes Relative: 48 %
Lymphs Abs: 1.9 K/uL (ref 0.7–4.0)
MCH: 32.5 pg (ref 26.0–34.0)
MCHC: 33.1 g/dL (ref 30.0–36.0)
MCV: 98.4 fL (ref 80.0–100.0)
Monocytes Absolute: 0.4 K/uL (ref 0.1–1.0)
Monocytes Relative: 11 %
Neutro Abs: 1.5 K/uL — ABNORMAL LOW (ref 1.7–7.7)
Neutrophils Relative %: 40 %
Platelets: 279 K/uL (ref 150–400)
RBC: 3.72 MIL/uL — ABNORMAL LOW (ref 3.87–5.11)
RDW: 11.1 % — ABNORMAL LOW (ref 11.5–15.5)
WBC: 3.8 K/uL — ABNORMAL LOW (ref 4.0–10.5)
nRBC: 0 % (ref 0.0–0.2)

## 2024-09-21 LAB — T4, FREE: Free T4: 1.45 ng/dL — ABNORMAL HIGH (ref 0.61–1.12)

## 2024-09-21 LAB — TSH: TSH: 0.542 u[IU]/mL (ref 0.350–4.500)

## 2024-09-21 MED ORDER — ONDANSETRON HCL 4 MG/2ML IJ SOLN
4.0000 mg | Freq: Once | INTRAMUSCULAR | Status: AC
  Administered 2024-09-21: 4 mg via INTRAVENOUS
  Filled 2024-09-21: qty 2

## 2024-09-21 MED ORDER — SUMATRIPTAN SUCCINATE 50 MG PO TABS: 50.0000 mg | ORAL_TABLET | Freq: Every day | ORAL | 0 refills | Status: AC | PRN

## 2024-09-21 MED ORDER — KETOROLAC TROMETHAMINE 15 MG/ML IJ SOLN
15.0000 mg | Freq: Once | INTRAMUSCULAR | Status: AC
  Administered 2024-09-21: 15 mg via INTRAVENOUS
  Filled 2024-09-21: qty 1

## 2024-09-21 MED ORDER — SODIUM CHLORIDE 0.9 % IV BOLUS
1000.0000 mL | Freq: Once | INTRAVENOUS | Status: AC
  Administered 2024-09-21: 1000 mL via INTRAVENOUS

## 2024-09-21 NOTE — Assessment & Plan Note (Addendum)
 Nagging headache for three days with new onset blurry vision. Vitals stable. Pt reports this headache is different from her usual pattern. - Recommended evaluation for new onset blurry vision with headache at ED. -Pt agreeable to go to [facility] ED, called and report provided to triage nurse.

## 2024-09-21 NOTE — ED Triage Notes (Signed)
 Patient c/o headache X3 days. Reports headache subsides if she lays down. Takes migraine medicine but reports she is out.   Also reports intermittent numbness in legs for weeks

## 2024-09-21 NOTE — Progress Notes (Signed)
 Established Patient Office Visit  Subjective:  Patient ID: Miranda Fowler, female    DOB: 01/20/1981  Age: 43 y.o. MRN: [id number]  CC:  Chief Complaint  Patient presents with   Acute Visit    Nagging headaches x 3 days Nausea & blurry vision sometimes Pain 6/10 Numbness in both legs like her legs are going to go out   Discussed the use of AI scribe software for clinical note transcription with the patient, who gave verbal consent to proceed.  History of Present Illness   History of Present Illness   Miranda Fowler is a 43 year old female with h/o migraines who presents with a nagging headache and new onset blurry vision. She is not  followed by neurology.  She has had a persistent frontal and temporal headache for three days, described as nagging and sometimes associated with nausea but no vomiting. Tylenol  has not helped. She usually has migraines but has been out of her migraine medication, and she feels this headache is different from her usual pattern.  She also has new persistent blurry vision today, which she feels is different from her typical migraine symptoms.  Over the past week she has had intermittent numbness, tingling, and weakness in her legs, sometimes needing to sit.   She has no dizziness. Symptoms improve in a dark, quiet room without movement, and she feels better lying still with lights and TV off. She does not have light sensitivity.      Past Medical History:  Diagnosis Date   Anemia    Chronic problem for patient.  Followed by Heme, receives IV iron transfusions secondary to inability to tolerate PO supplements.     Arthritis    bilateral knees   Headache    Migraines   History of ADHD 02/12/2022   Hypothyroidism    post-radiation at age 27   Migraine    Shortness of breath dyspnea    Vaginal high risk HPV DNA test positive 07/20/2021    Past Surgical History:  Procedure Laterality Date   BILATERAL SALPINGECTOMY Bilateral 10/18/2006    pt denies   BREAST BIOPSY Right 10/05/2022   MM RT BREAST BX W LOC DEV 1ST LESION IMAGE BX SPEC STEREO GUIDE 10/05/2022 [facility]   BREAST BIOPSY Left 10/05/2022   MM LT BREAST BX W LOC DEV 1ST LESION IMAGE BX SPEC STEREO GUIDE 10/05/2022 [facility]   CESAREAN SECTION     CYSTOSCOPY N/A 04/06/2016   Procedure: CYSTOSCOPY;  Surgeon: Curlee VEAR Guan, MD;  Location: [facility];  Service: Gynecology;  Laterality: N/A;   ENTEROSCOPY N/A 02/03/2023   Procedure: ENTEROSCOPY;  Surgeon: Shila Gustav GAILS, MD;  Location: [facility];  Service: Gastroenterology;  Laterality: N/A;  Push-enteroscopy   LAPAROSCOPIC ASSISTED VAGINAL HYSTERECTOMY N/A 04/06/2016   Procedure: LAPAROSCOPIC ASSISTED VAGINAL HYSTERECTOMY,  Bilateral Salpingectomy;  Surgeon: Curlee VEAR Guan, MD;  Location: [facility];  Service: Gynecology;  Laterality: N/A;   TUBAL LIGATION      Family History  Problem Relation Age of Onset   Anemia Mother    Lupus Mother    Sickle cell trait Mother    Hypertension Father    Sickle cell trait Sister    Breast cancer Paternal Aunt    Cancer Paternal Aunt        breast   Cancer Paternal Aunt        cervical   Colon cancer Paternal Uncle    Diabetes Maternal Grandmother    Diabetes  Maternal Grandfather    Heart disease Paternal Grandmother     Social History   Socioeconomic History   Marital status: Married    Spouse name: Not on file   Number of children: Not on file   Years of education: Not on file   Highest education level: Not on file  Occupational History   Not on file  Tobacco Use   Smoking status: Never   Smokeless tobacco: Never  Vaping Use   Vaping status: Never Used  Substance and Sexual Activity   Alcohol use: No    Alcohol/week: 0.0 standard drinks of alcohol    Comment: wine   Drug use: No   Sexual activity: Yes    Birth control/protection: Surgical    Comment: TUBAL-LIAGTION  Other Topics Concern   Not on file  Social History Narrative    Married, 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  10/2016   Social Drivers of Corporate Investment Banker Strain: Not on file  Food Insecurity: Not on file  Transportation Needs: Not on file  Physical Activity: Not on file  Stress: Not on file  Social Connections: Not on file  Intimate Partner Violence: Not on file     Outpatient Medications Prior to Visit  Medication Sig Dispense Refill   amphetamine -dextroamphetamine  (ADDERALL) 5 MG tablet Take 1 tablet (5 mg total) by mouth daily. 30 tablet 0   levocetirizine (XYZAL ) 5 MG tablet Take 1 tablet (5 mg total) by mouth every evening. 90 tablet 0   linaclotide  (LINZESS ) 145 MCG CAPS capsule Take 1 capsule (145 mcg total) by mouth daily before breakfast.     meclizine  (ANTIVERT ) 25 MG tablet Take 1 tablet (25 mg total) by mouth 3 (three) times daily as needed for dizziness. 30 tablet 1   ondansetron  (ZOFRAN -ODT) 4 MG disintegrating tablet Take 1 tablet (4 mg total) by mouth every 8 (eight) hours as needed for nausea or vomiting. 20 tablet 0   SUMAtriptan  (IMITREX ) 50 MG tablet Take 1 tablet (50 mg total) by mouth daily as needed for migraine. May repeat in 2 hours if headache persists or recurs. 10 tablet 0   SYNTHROID  125 MCG tablet TAKE 1 TABLET(125 MCG) BY MOUTH DAILY BEFORE BREAKFAST 90 tablet 3   No facility-administered medications prior to visit.    No Known Allergies  ROS Review of Systems Negative unless indicated in HPI.    Objective:    Physical Exam Constitutional:      Appearance: Normal appearance.  Eyes:     General: Visual field deficit present.  Cardiovascular:     Rate and Rhythm: Normal rate and regular rhythm.     Pulses: Normal pulses.     Heart sounds: Normal heart sounds.  Pulmonary:     Effort: Pulmonary effort is normal.     Breath sounds: Normal breath sounds.  Neurological:     Mental Status: She is alert.     Coordination: Finger-Nose-Finger Test and Heel to [place] Test normal.      Comments: Not able to follow the finger.     BP 120/76   Pulse 68   Temp 97.9 F (36.6 C)   Ht 5' 2 (1.575 m)   Wt 150 lb 3.2 oz (68.1 kg)   LMP 11/07/2015   SpO2 99%   BMI 27.47 kg/m  Wt Readings from Last 3 Encounters:  09/21/24 150 lb 3.2 oz (68.1 kg)  09/21/24 150 lb 3.2 oz (68.1 kg)  01/13/24  148 lb (67.1 kg)     Health Maintenance  Topic Date Due   Hepatitis B Vaccines 19-59 Average Risk (1 of 3 - 19+ 3-dose series) Never done   HPV VACCINES (1 - 3-dose SCDM series) Never done   COVID-19 Vaccine (3 - 2025-26 season) 06/18/2024   Mammogram  09/20/2024   Influenza Vaccine  01/15/2025 (Originally 05/18/2024)   DTaP/Tdap/Td (3 - Td or Tdap) 01/12/2034   Hepatitis C Screening  Completed   HIV Screening  Completed   Pneumococcal Vaccine  Aged Out   Meningococcal B Vaccine  Aged Out       Topic Date Due   Hepatitis B Vaccines 19-59 Average Risk (1 of 3 - 19+ 3-dose series) Never done   HPV VACCINES (1 - 3-dose SCDM series) Never done    Lab Results  Component Value Date   TSH 2.06 01/13/2024   Lab Results  Component Value Date   WBC 3.4 (L) 01/13/2024   HGB 11.9 (L) 01/13/2024   HCT 35.3 (L) 01/13/2024   MCV 100.9 (H) 01/13/2024   PLT 267.0 01/13/2024   Lab Results  Component Value Date   NA 140 01/13/2024   K 3.5 01/13/2024   CHLORIDE 106 11/02/2013   CO2 30 01/13/2024   GLUCOSE 95 01/13/2024   BUN 12 01/13/2024   CREATININE 0.74 01/13/2024   BILITOT 0.6 01/13/2024   ALKPHOS 73 01/13/2024   AST 15 01/13/2024   ALT 12 01/13/2024   PROT 7.1 01/13/2024   ALBUMIN 3.9 01/13/2024   CALCIUM 9.6 01/13/2024   ANIONGAP 7 03/27/2022   EGFR 97 07/17/2021   GFR 99.26 01/13/2024   Lab Results  Component Value Date   CHOL 179 01/13/2024   Lab Results  Component Value Date   HDL 75.90 01/13/2024   Lab Results  Component Value Date   LDLCALC 98 01/13/2024   Lab Results  Component Value Date   TRIG 30.0 01/13/2024   Lab Results  Component Value  Date   CHOLHDL 2 01/13/2024   Lab Results  Component Value Date   HGBA1C 5.2 08/10/2023      Assessment & Plan:   Assessment & Plan Intractable headache, unspecified chronicity pattern, unspecified headache type Nagging headache for three days with new onset blurry vision. Vitals stable. Pt reports this headache is different from her usual pattern. - Recommended evaluation for new onset blurry vision with headache at ED. -Pt agreeable to go to [facility] ED, called and report provided to triage nurse.     Blurry vision, bilateral New onset of blurry vision with headache.  -- Recommended evaluation for new onset blurry vision with headache at ER    Numbness and tingling of both legs Intermittent numbness and tingling in both legs over the past week. No current symptoms.      Assessment & Plan       Follow-up: No follow-ups on file.   Zeena Starkel, NP

## 2024-09-21 NOTE — Assessment & Plan Note (Addendum)
 Intermittent numbness and tingling in both legs over the past week. No current symptoms.

## 2024-09-21 NOTE — Discharge Instructions (Signed)
 Your workup was reassuring and your blood work your hemoglobin was reassuring.  Your thyroid  testing TSH was normal but free T4 was a little abnormal and you should follow this up with your primary care doctor to discuss if your medications need to be adjusted.  We discussed CT imaging to rule out any type of intercranial process that could be life-threatening or dangerous but you have stated that this is your typical migraine and have opted to hold off.  If you develop any change in symptoms worsening symptoms or any other concerns you should return to the ER for repeat evaluation.  We have prescribed you your Imitrex  to try to help with your migraines and I have also given you the number for neurology and ophthalmology to follow-up with to try to figure out why these migraines continue to happen and to see if there is any other cause of them. Please call to make an appointment.

## 2024-09-21 NOTE — ED Provider Notes (Signed)
 [facility] Provider Note    Event Date/Time   First MD Initiated Contact with Patient 09/21/24 1216     (approximate)   History   Headache   HPI  Miranda Fowler is a 43 y.o. female with history of migraines who comes in with a headache.  Patient reports a headache for the past 3 days.  She reports that has been gradually increasing since onset.  She reports having headaches previously that have been more severe than this 1.  She reports a headache back in 2024 that she came to the emergency room for.  She states that this headache was much worse than her prior headaches.  I reviewed this note where she was treated with a migraine cocktail and was discharged home.  She was sent with some Zofran .  She reports that since then she had followed up with her primary care doctor for continued intermittent headaches and she was prescribed Imitrex .  I reviewed a note from 12/21/2023 where the note mentioned that patient had tried Maxalt  and then they had changed her to Imitrex .  And this had seemed to work better for it.  Patient reports that she went to go see the doctor today to try to get an Imitrex  refill.  She states that she ran out of it.  She states that she saw somebody who is a different doctor and that she feels like if she had just gone to her normal doctor they would have written her the prescription but this new doctor sent her here for evaluation.  I reviewed this note from today where patient had mentioned having blurred vision as well as intermittent numbness to her legs.  When I addressed this with patient and explained this is why they sent her over here most likely patient states that she is not having any blurred vision that it is more just pain with light.  She reports seeing fine.  She typically wears glasses and she does not have them currently.  When asked her about the intermittent leg numbness she states that it is more intermittent tingling in her  bilateral legs that has been going off and on for years.  She states that typically this happens when she gets anemic.  She denies this being anything new today.  She denies any weakness, numbness today.  Patient reports just a nagging headache on the left side of her head.  She states that this is similar to when she has had prior migraines before and she reports that this is not as severe as headaches that have been in the past.  Patient had a prior hysterectomy denies any OCP use.  Ct head in 2023 was normal    Physical Exam   Triage Vital Signs: ED Triage Vitals  Encounter Vitals Group     BP 09/21/24 1213 (!) 146/94     Girls Systolic BP Percentile --      Girls Diastolic BP Percentile --      Boys Systolic BP Percentile --      Boys Diastolic BP Percentile --      Pulse Rate 09/21/24 1213 (!) 59     Resp 09/21/24 1213 18     Temp 09/21/24 1213 98.1 F (36.7 C)     Temp Source 09/21/24 1213 Oral     SpO2 09/21/24 1213 100 %     Weight 09/21/24 1211 150 lb 3.2 oz (68.1 kg)     Height 09/21/24 1211 5'  2 (1.575 m)     Head Circumference --      Peak Flow --      Pain Score 09/21/24 1211 7     Pain Loc --      Pain Education --      Exclude from Growth Chart --     Most recent vital signs: Vitals:   09/21/24 1213  BP: (!) 146/94  Pulse: (!) 59  Resp: 18  Temp: 98.1 F (36.7 C)  SpO2: 100%     General: Awake, no distress.  CV:  Good peripheral perfusion.  Resp:  Normal effort.  Abd:  No distention.  Soft and nontender Other:  Cranial nerve II to XII are intact.  Equal strength in arms and legs.  Sensation intact throughout.  No weakness no numbness noted.  Pupils reactive bilaterally.  Vision acuity was done and she was 20/20 on the right and 20/30 on the left and 20/20 combined but patient reports that typically she wears glasses.  She denies any changes to her vision at this time.   ED Results / Procedures / Treatments   Labs (all labs ordered are listed, but  only abnormal results are displayed) Labs Reviewed - No data to display   PROCEDURES:  Critical Care performed: No  Procedures   MEDICATIONS ORDERED IN ED: Medications  sodium chloride  0.9 % bolus 1,000 mL (1,000 mLs Intravenous New Bag/Given 09/21/24 1242)  ondansetron  (ZOFRAN ) injection 4 mg (4 mg Intravenous Given 09/21/24 1243)  ketorolac  (TORADOL ) 15 MG/ML injection 15 mg (15 mg Intravenous Given 09/21/24 1243)     IMPRESSION / MDM / ASSESSMENT AND PLAN / ED COURSE  I reviewed the triage vital signs and the nursing notes.   Patient's presentation is most consistent with acute presentation with potential threat to life or bodily function.   Patient comes in with migraine.  Patient had CT imaging back in 2023 without evidence of any type of intracranial mass.  She reports that this headache is similar to her prior headaches and contrary to what she told the NP today states that she is not having blurred vision and it is sensitivity to light.  Her visual acuity here is very reassuring.  She is not on any birth control or have any extraocular eye movement discrepancy to suggest cavernous venous thrombus.  Her headache is not severe and sudden onset to suggest subarachnoid hemorrhage.  Her neuroexam is normal here.  We discussed getting a CT scan if she did have new blurred vision or new weakness or numbness in the legs but patient declined stating that her vision is at baseline and stating that the numbness and tingling in her legs has been an intermittent issue for years that she attributes to her anemia. She is adamant that this headache is the same as priors.  She states that she just wants to have a prescription for her Imitrex  so that she can go home.  We did discuss trying to give her a migraine cocktail and giving her some blood work to try to make sure that her hemoglobin was reassuring.  Should patient declines and she just wanted a prescription but then stated that she would try the  medications but she just did not want anything that would make her sleepy.  She reports that last time she was here the medication made her sleepy.  We discussed avoiding IV Benadryl  and try the other medications instead.  She did request that if we are checking her  blood work that we could also check her thyroid  numbers which I stated that I would do but she would need to have this followed up with her primary care doctor for adjustment of her medications as I do not think this represents thyroid  storm or myxedema coma.  Patient is free T4 is slightly elevated.  Her CBC does show slightly low white count but similar to her prior.  Her CMP is reassuring and TSH is normal and her hemoglobin was reassuring at 12.1  1:29 PM repeat evaluation patient reports significant improvement in symptoms she has had resolution of headache continues to deny any numbness, tingling, blurred vision.  We again discussed CT imaging.  We discussed how CT imaging could be used to rule out any type of bleeding or mass although mass seems less likely given she had a CT head back in 2023.  But patient is adamant that these are similar symptoms that she has had previously and this just feels like her normal migraine and she does not want to undergo CT imaging.  Given the risk for radiation and patient stating that this is her typical migraine I think this is reasonable and I will prescribe her her Imitrex .  She also is had resolution of symptoms and her repeat neurological exam remains unchanged..  I will give her her prescription for Imitrex  and give her the number for neurology and [facility] to follow-up with outpatient.     FINAL CLINICAL IMPRESSION(S) / ED DIAGNOSES   Final diagnoses:  Other migraine without status migrainosus, intractable     Rx / DC Orders   ED Discharge Orders          Ordered    SUMAtriptan  (IMITREX ) 50 MG tablet  Daily PRN        09/21/24 1337             Note:  This document was  prepared using Dragon voice recognition software and may include unintentional dictation errors.   Ernest Ronal BRAVO, MD 09/21/24 [phone]

## 2024-09-21 NOTE — Assessment & Plan Note (Addendum)
 New onset of blurry vision with headache.  -- Recommended evaluation for new onset blurry vision with headache at ER

## 2024-11-06 ENCOUNTER — Ambulatory Visit: Payer: Self-pay

## 2024-11-06 ENCOUNTER — Encounter: Admitting: General Practice

## 2024-11-06 ENCOUNTER — Encounter: Payer: Self-pay | Admitting: General Practice

## 2024-11-06 NOTE — Progress Notes (Signed)
 A user error has taken place: encounter opened in error, closed for administrative reasons.

## 2024-11-06 NOTE — Telephone Encounter (Signed)
 Appt scheduled with provider

## 2024-11-06 NOTE — Telephone Encounter (Signed)
 FYI Only or Action Required?: Action required by provider: request for appointment, clinical question for provider, and update on patient condition.  Patient was last seen in primary care on 09/21/2024 by Miranda Saber, NP.  Called Nurse Triage reporting Cough.  Symptoms began a week ago.  Interventions attempted: OTC medications: robutussin; cough drop.  Symptoms are: gradually worsening.  Triage Disposition: See Physician Within 24 Hours  Patient/caregiver understands and will follow disposition?: Yes     Message from Hadassah PARAS sent at 11/06/2024 10:34 AM EST  Reason for Triage: Cough since last week, otc med is not working, burning sensation on chest, runny nose, headache. Transferred to NT     Reason for Disposition  [1] Continuous (nonstop) coughing interferes with work or school AND [2] no improvement using cough treatment per Care Advice  Answer Assessment - Initial Assessment Questions Pt called to report worsening cold symptoms x 1 week. Pt has tried otc robitussin and cough drops with no relief. Pt denies fever but reports mucous is green and d/t thickness and coughing spells pt has vomited. Pt is currently out of town but requesting appt for tomorrow at clinic; no appts available until Thursday 01/22. Pt placed on wait list for cancellation. 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 Reassured her that I would update PCP and pt available via phone.     1. ONSET: When did the cough begin?      X 1 week   2. SEVERITY: How bad is the cough today?      Moderate to severe   3. SPUTUM: Describe the color of your sputum (e.g., none, dry cough; clear, white, yellow, green)     Turning green   4. HEMOPTYSIS: Are you coughing up any blood? If Yes, ask: How much? (e.g., flecks, streaks, tablespoons, etc.)     No   5. DIFFICULTY BREATHING: Are you having difficulty breathing? If  Yes, ask: How bad is it? (e.g., mild, moderate, severe)      No   6. FEVER: Do you have a fever? If Yes, ask: What is your temperature, how was it measured, and when did it start?     No   7. CARDIAC HISTORY: Do you have any history of heart disease? (e.g., heart attack, congestive heart failure)      N/a  8. LUNG HISTORY: Do you have any history of lung disease?  (e.g., pulmonary embolus, asthma, emphysema)     No  9. PE RISK FACTORS: Do you have a history of blood clots? (or: recent major surgery, recent prolonged travel, bedridden)     N/a  10. OTHER SYMPTOMS: Do you have any other symptoms? (e.g., runny nose, wheezing, chest pain)       Runny nose, chest congestion and pain with coughing  Protocols used: Cough - Acute Productive-A-AH

## 2024-11-07 ENCOUNTER — Telehealth (INDEPENDENT_AMBULATORY_CARE_PROVIDER_SITE_OTHER): Admitting: General Practice

## 2024-11-07 ENCOUNTER — Encounter: Payer: Self-pay | Admitting: General Practice

## 2024-11-07 VITALS — Ht 62.0 in | Wt 150.0 lb

## 2024-11-07 DIAGNOSIS — J0140 Acute pansinusitis, unspecified: Secondary | ICD-10-CM

## 2024-11-07 MED ORDER — BENZONATATE 200 MG PO CAPS: 200.0000 mg | ORAL_CAPSULE | Freq: Three times a day (TID) | ORAL | 0 refills | Status: AC | PRN

## 2024-11-07 MED ORDER — AMOXICILLIN-POT CLAVULANATE 875-125 MG PO TABS: 1.0000 | ORAL_TABLET | Freq: Two times a day (BID) | ORAL | 0 refills | Status: AC

## 2024-11-07 NOTE — Patient Instructions (Signed)
 You can try a few things over the counter to help with your symptoms including:  Cough: Delsym or Robitussin (get the off brand, works just as well) Chest Congestion: Mucinex  (plain) Nasal Congestion/Ear Pressure/Sinus Pressure: Try using Flonase  (fluticasone ) nasal spray. Instill 1 spray in each nostril twice daily. This can be purchased over the counter. Body aches, fevers, headache: Ibuprofen  (not to exceed 2400 mg in 24 hours) or Acetaminophen -Tylenol  (not to exceed 3000 mg in 24 hours) Runny Nose/Throat Drainage/Sneezing/Itchy or Watery Eyes: An antihistamine such as Zyrtec , Claritin, Xyzal , Allegra  Start Augmentin  antibiotics. Take 1 tablet by mouth twice daily for 7 days.  Start Benzonatate  capsules for cough. Take 1 capsule by mouth three times daily as needed for cough.  Drink plenty of fluids.  Rest.  Use cool mist humidifier.  Hope you feel better soon.   It was a pleasure to see you today!

## 2024-11-07 NOTE — Progress Notes (Signed)
 "  Virtual Visit via Video Note  I connected with Miranda Fowler on 11/07/24 at  8:20 AM EST by a video enabled telemedicine application and verified that I am speaking with the correct person using two identifiers.  Patient Location: Home Provider Location: Home Office  I discussed the limitations, risks, security, and privacy concerns of performing an evaluation and management service by video and the availability of in person appointments. I also discussed with the patient that there may be a patient responsible charge related to this service. The patient expressed understanding and agreed to proceed.  Subjective: PCP: Vincente Shivers, NP  Chief Complaint  Patient presents with   Cough    With runny nose, HA, slight SOB since Thursday. Patient has taken robitussin, cough drops and some alkzter. Patient got nausea and over heated on the plane ride back last night.    Cough    Discussed the use of AI scribe software for clinical note transcription with the patient, who gave verbal consent to proceed.  History of Present Illness Miranda Fowler is a 44 year old female who presents with headache, cough, and sinus pressure.  She has been experiencing a pounding headache since Thursday, persisting for nearly a week. She has a history of headaches and previously used Imitrex  for migraines but stopped taking it because it made her feel 'weird' and she did not like the feeling.  In addition to the headache, she has had a persistent cough since Thursday, accompanied by a runny nose, slight shortness of breath, and nausea, which was particularly noticeable during a plane ride. She feels weak and fatigued. She has tried over-the-counter medications including Robitussin, cough drops, Alka-Seltzer, but none have provided relief. She also reports sinus pain and burning in her nose, with increased nasal discharge.  Last night, she experienced chills and diarrhea. No ear pain, sore throat,  urinary symptoms, wheezing, productive cough, or blurred vision. She denies smoking and has not checked her temperature to confirm a fever.    ROS: Per HPI Current Medications[1]  Observations/Objective: Today's Vitals   11/07/24 0827  Weight: 150 lb (68 kg)  Height: 5' 2 (1.575 m)   Physical Exam Nursing note reviewed.  Constitutional:      Appearance: She is ill-appearing.  HENT:     Nose: Congestion present.  Eyes:     Conjunctiva/sclera: Conjunctivae normal.  Pulmonary:     Effort: Pulmonary effort is normal. No respiratory distress.  Neurological:     Mental Status: She is alert and oriented to person, place, and time.  Psychiatric:        Mood and Affect: Mood normal.        Behavior: Behavior normal.        Thought Content: Thought content normal.        Judgment: Judgment normal.     Assessment and Plan: Acute non-recurrent pansinusitis -     Amoxicillin -Pot Clavulanate; Take 1 tablet by mouth 2 (two) times daily for 7 days.  Dispense: 14 tablet; Refill: 0 -     Benzonatate ; Take 1 capsule (200 mg total) by mouth 3 (three) times daily as needed.  Dispense: 20 capsule; Refill: 0   Assessment and Plan Assessment & Plan Acute pansinusitis Sinus pressure, nasal congestion, headache, cough, runny nose, slight shortness of breath, nausea, and diarrhea. Headache may be exacerbated by sinus pressure.  - Prescribed Augmentin  1 tablet twice a day for 7 days with food. - Prescribed Tessalon  Perles for cough. -  Recommended Flonase  for sinus pressure and nasal congestion. - Recommended Mucinex  for chest congestion. - Advised increasing fluid intake and using a cool mist humidifier. - Instructed to monitor blood pressure at home. - Advised use of Tylenol  or Excedrin for headache relief. - Provided after visit summary with instructions and recommendations. - Advised in-office evaluation if symptoms do not improve. - Advised ER visit if shortness of breath worsens.    Follow Up Instructions: Return if symptoms worsen or fail to improve.   I discussed the assessment and treatment plan with the patient. The patient was provided an opportunity to ask questions, and all were answered. The patient agreed with the plan and demonstrated an understanding of the instructions.   The patient was advised to call back or seek an in-person evaluation if the symptoms worsen or if the condition fails to improve as anticipated.  The above assessment and management plan was discussed with the patient. The patient verbalized understanding of and has agreed to the management plan.   Carrol Aurora, NP     [1]  Current Outpatient Medications:    amoxicillin -clavulanate (AUGMENTIN ) 875-125 MG tablet, Take 1 tablet by mouth 2 (two) times daily for 7 days., Disp: 14 tablet, Rfl: 0   benzonatate  (TESSALON ) 200 MG capsule, Take 1 capsule (200 mg total) by mouth 3 (three) times daily as needed., Disp: 20 capsule, Rfl: 0   levocetirizine (XYZAL ) 5 MG tablet, Take 1 tablet (5 mg total) by mouth every evening., Disp: 90 tablet, Rfl: 0   linaclotide  (LINZESS ) 145 MCG CAPS capsule, Take 1 capsule (145 mcg total) by mouth daily before breakfast., Disp: , Rfl:    meclizine  (ANTIVERT ) 25 MG tablet, Take 1 tablet (25 mg total) by mouth 3 (three) times daily as needed for dizziness., Disp: 30 tablet, Rfl: 1   ondansetron  (ZOFRAN -ODT) 4 MG disintegrating tablet, Take 1 tablet (4 mg total) by mouth every 8 (eight) hours as needed for nausea or vomiting., Disp: 20 tablet, Rfl: 0   SUMAtriptan  (IMITREX ) 50 MG tablet, Take 1 tablet (50 mg total) by mouth daily as needed for up to 10 days for migraine. May repeat in 2 hours if headache persists or recurs., Disp: 10 tablet, Rfl: 0   SYNTHROID  125 MCG tablet, TAKE 1 TABLET(125 MCG) BY MOUTH DAILY BEFORE BREAKFAST, Disp: 90 tablet, Rfl: 3  "

## 2024-11-16 ENCOUNTER — Encounter: Payer: Self-pay | Admitting: Family Medicine

## 2024-11-16 ENCOUNTER — Ambulatory Visit: Admitting: Family Medicine

## 2024-11-16 VITALS — BP 90/72 | HR 79 | Temp 98.4°F | Ht 62.0 in | Wt 151.4 lb

## 2024-11-16 DIAGNOSIS — J9801 Acute bronchospasm: Secondary | ICD-10-CM | POA: Diagnosis not present

## 2024-11-16 MED ORDER — ALBUTEROL SULFATE HFA 108 (90 BASE) MCG/ACT IN AERS: 2.0000 | INHALATION_SPRAY | Freq: Four times a day (QID) | RESPIRATORY_TRACT | 0 refills | Status: AC | PRN

## 2024-11-16 MED ORDER — PREDNISONE 10 MG PO TABS: ORAL_TABLET | ORAL | 0 refills | Status: AC

## 2024-11-16 NOTE — Progress Notes (Signed)
 "   Patient ID: Miranda Fowler, female    DOB: Oct 10, 1981, 44 y.o.   MRN: [id number]  This visit was conducted in person.  BP 90/72   Pulse 79   Temp 98.4 F (36.9 C) (Temporal)   Ht 5' 2 (1.575 m)   Wt 151 lb 6 oz (68.7 kg)   LMP 11/07/2015   SpO2 98%   BMI 27.69 kg/m    CC:  Chief Complaint  Patient presents with   Cough    Ongoing-Got sick on 1/16    Subjective:   HPI: Miranda Fowler is a 44 y.o. female presenting on 11/16/2024 for Cough (Ongoing-Got sick on 1/16)  Date of onset:  11/02/2024  Telemedicine visit on 1/21 with PCP Dx with acute sinusitis.SABRA treated with Augmentin  x 7 days Initial symptoms included  cough,  headache, fatigue, decreased appetite, nasal congestion.  Post tussive presyncope, chill  Facial pain improved, no headache Feeling better overall.  Still having persistent cough, dry.. coughing fits... cannot stop.  No SOB.   No wheeze.  Sick contacts:  runner, broadcasting/film/video.. exposed to flu COVID testing:   none     She has tried to treat with  robitussin, benzonatate .     No history of chronic lung disease such as asthma or COPD. Non-smoker.       Relevant past medical, surgical, family and social history reviewed and updated as indicated. Interim medical history since our last visit reviewed. Allergies and medications reviewed and updated. Outpatient Medications Prior to Visit  Medication Sig Dispense Refill   benzonatate  (TESSALON ) 200 MG capsule Take 1 capsule (200 mg total) by mouth 3 (three) times daily as needed. 20 capsule 0   levocetirizine (XYZAL ) 5 MG tablet Take 1 tablet (5 mg total) by mouth every evening. 90 tablet 0   meclizine  (ANTIVERT ) 25 MG tablet Take 1 tablet (25 mg total) by mouth 3 (three) times daily as needed for dizziness. 30 tablet 1   ondansetron  (ZOFRAN -ODT) 4 MG disintegrating tablet Take 1 tablet (4 mg total) by mouth every 8 (eight) hours as needed for nausea or vomiting. 20 tablet 0   SUMAtriptan  (IMITREX ) 50 MG  tablet Take 1 tablet (50 mg total) by mouth daily as needed for up to 10 days for migraine. May repeat in 2 hours if headache persists or recurs. 10 tablet 0   SYNTHROID  125 MCG tablet TAKE 1 TABLET(125 MCG) BY MOUTH DAILY BEFORE BREAKFAST 90 tablet 3   linaclotide  (LINZESS ) 145 MCG CAPS capsule Take 1 capsule (145 mcg total) by mouth daily before breakfast.     No facility-administered medications prior to visit.     Per HPI unless specifically indicated in ROS section below Review of Systems  Constitutional:  Negative for fatigue and fever.  HENT:  Negative for congestion.   Eyes:  Negative for pain.  Respiratory:  Negative for cough and shortness of breath.   Cardiovascular:  Negative for chest pain, palpitations and leg swelling.  Gastrointestinal:  Negative for abdominal pain.  Genitourinary:  Negative for dysuria and vaginal bleeding.  Musculoskeletal:  Negative for back pain.  Neurological:  Negative for syncope, light-headedness and headaches.  Psychiatric/Behavioral:  Negative for dysphoric mood.    Objective:  BP 90/72   Pulse 79   Temp 98.4 F (36.9 C) (Temporal)   Ht 5' 2 (1.575 m)   Wt 151 lb 6 oz (68.7 kg)   LMP 11/07/2015   SpO2 98%   BMI 27.69 kg/m  Wt Readings from Last 3 Encounters:  11/16/24 151 lb 6 oz (68.7 kg)  11/07/24 150 lb (68 kg)  11/06/24 150 lb (68 kg)      Physical Exam Constitutional:      General: She is not in acute distress.    Appearance: Normal appearance. She is well-developed. She is not ill-appearing or toxic-appearing.  HENT:     Head: Normocephalic.     Right Ear: Hearing, tympanic membrane, ear canal and external ear normal. Tympanic membrane is not erythematous, retracted or bulging.     Left Ear: Hearing, tympanic membrane, ear canal and external ear normal. Tympanic membrane is not erythematous, retracted or bulging.     Nose: No mucosal edema or rhinorrhea.     Right Sinus: No maxillary sinus tenderness or frontal sinus  tenderness.     Left Sinus: No maxillary sinus tenderness or frontal sinus tenderness.     Mouth/Throat:     Pharynx: Uvula midline.  Eyes:     General: Lids are normal. Lids are everted, no foreign bodies appreciated.     Conjunctiva/sclera: Conjunctivae normal.     Pupils: Pupils are equal, round, and reactive to light.  Neck:     Thyroid : No thyroid  mass or thyromegaly.     Vascular: No carotid bruit.     Trachea: Trachea normal.  Cardiovascular:     Rate and Rhythm: Normal rate and regular rhythm.     Pulses: Normal pulses.     Heart sounds: Normal heart sounds, S1 normal and S2 normal. No murmur heard.    No friction rub. No gallop.  Pulmonary:     Effort: Pulmonary effort is normal. No tachypnea or respiratory distress.     Breath sounds: Normal breath sounds. No decreased breath sounds, wheezing, rhonchi or rales.  Abdominal:     General: Bowel sounds are normal.     Palpations: Abdomen is soft.     Tenderness: There is no abdominal tenderness.  Musculoskeletal:     Cervical back: Normal range of motion and neck supple.  Skin:    General: Skin is warm and dry.     Findings: No rash.  Neurological:     Mental Status: She is alert.  Psychiatric:        Mood and Affect: Mood is not anxious or depressed.        Speech: Speech normal.        Behavior: Behavior normal. Behavior is cooperative.        Thought Content: Thought content normal.        Judgment: Judgment normal.       Results for orders placed or performed during the hospital encounter of 09/21/24  CBC with Differential   Collection Time: 09/21/24 12:37 PM  Result Value Ref Range   WBC 3.8 (L) 4.0 - 10.5 K/uL   RBC 3.72 (L) 3.87 - 5.11 MIL/uL   Hemoglobin 12.1 12.0 - 15.0 g/dL   HCT 63.3 63.9 - 53.9 %   MCV 98.4 80.0 - 100.0 fL   MCH 32.5 26.0 - 34.0 pg   MCHC 33.1 30.0 - 36.0 g/dL   RDW 88.8 (L) 88.4 - 84.4 %   Platelets 279 150 - 400 K/uL   nRBC 0.0 0.0 - 0.2 %   Neutrophils Relative % 40 %    Neutro Abs 1.5 (L) 1.7 - 7.7 K/uL   Lymphocytes Relative 48 %   Lymphs Abs 1.9 0.7 - 4.0 K/uL   Monocytes Relative 11 %  Monocytes Absolute 0.4 0.1 - 1.0 K/uL   Eosinophils Relative 0 %   Eosinophils Absolute 0.0 0.0 - 0.5 K/uL   Basophils Relative 1 %   Basophils Absolute 0.0 0.0 - 0.1 K/uL   Immature Granulocytes 0 %   Abs Immature Granulocytes 0.00 0.00 - 0.07 K/uL  Comprehensive metabolic panel   Collection Time: 09/21/24 12:37 PM  Result Value Ref Range   Sodium 138 135 - 145 mmol/L   Potassium 4.2 3.5 - 5.1 mmol/L   Chloride 101 98 - 111 mmol/L   CO2 27 22 - 32 mmol/L   Glucose, Bld 81 70 - 99 mg/dL   BUN 12 6 - 20 mg/dL   Creatinine, Ser 9.20 0.44 - 1.00 mg/dL   Calcium 89.8 8.9 - 89.6 mg/dL   Total Protein 8.3 (H) 6.5 - 8.1 g/dL   Albumin 4.4 3.5 - 5.0 g/dL   AST 19 15 - 41 U/L   ALT 14 0 - 44 U/L   Alkaline Phosphatase 83 38 - 126 U/L   Total Bilirubin 0.6 0.0 - 1.2 mg/dL   GFR, Estimated >39 >39 mL/min   Anion gap 10 5 - 15  TSH   Collection Time: 09/21/24 12:37 PM  Result Value Ref Range   TSH 0.542 0.350 - 4.500 uIU/mL  T4, free   Collection Time: 09/21/24 12:37 PM  Result Value Ref Range   Free T4 1.45 (H) 0.61 - 1.12 ng/dL    Assessment and Plan  Post-infection bronchospasm Assessment & Plan:  Acute, single significant improvement in symptoms with antibiotics for sinus infection.  Only remaining symptom is persistent dry hacking cough suggestive of reactive airways.  No clear sign of ongoing viral or bacterial infection. Will treat with prednisone  taper and albuterol  inhaler as needed. Lung exam clear and no indication for chest x-ray.  Return and  ER precautions provided.   Other orders -     predniSONE ; 3 tabs by mouth daily x 3 days, then 2 tabs by mouth daily x 2 days then 1 tab by mouth daily x 2 days  Dispense: 15 tablet; Refill: 0 -     Albuterol  Sulfate HFA; Inhale 2 puffs into the lungs every 6 (six) hours as needed for wheezing or  shortness of breath.  Dispense: 8 g; Refill: 0    No follow-ups on file.   Greig Ring, MD  "

## 2024-11-16 NOTE — Assessment & Plan Note (Signed)
"   Acute, single significant improvement in symptoms with antibiotics for sinus infection.  Only remaining symptom is persistent dry hacking cough suggestive of reactive airways.  No clear sign of ongoing viral or bacterial infection. Will treat with prednisone  taper and albuterol  inhaler as needed. Lung exam clear and no indication for chest x-ray.  Return and  ER precautions provided. "
